# Patient Record
Sex: Male | Born: 1955 | Race: White | Hispanic: No | Marital: Married | Smoking: Current every day smoker
Health system: Southern US, Community
[De-identification: ages and names within clinical notes are randomized; demographics above are authoritative.]

## PROBLEM LIST (undated history)

## (undated) DIAGNOSIS — E1165 Type 2 diabetes mellitus with hyperglycemia: Secondary | ICD-10-CM

## (undated) DIAGNOSIS — Z9889 Other specified postprocedural states: Secondary | ICD-10-CM

## (undated) DIAGNOSIS — I10 Essential (primary) hypertension: Secondary | ICD-10-CM

## (undated) DIAGNOSIS — F329 Major depressive disorder, single episode, unspecified: Secondary | ICD-10-CM

## (undated) DIAGNOSIS — F419 Anxiety disorder, unspecified: Secondary | ICD-10-CM

## (undated) DIAGNOSIS — I219 Acute myocardial infarction, unspecified: Secondary | ICD-10-CM

## (undated) DIAGNOSIS — C61 Malignant neoplasm of prostate: Secondary | ICD-10-CM

## (undated) DIAGNOSIS — I8390 Asymptomatic varicose veins of unspecified lower extremity: Secondary | ICD-10-CM

## (undated) DIAGNOSIS — Z8489 Family history of other specified conditions: Secondary | ICD-10-CM

## (undated) DIAGNOSIS — C801 Malignant (primary) neoplasm, unspecified: Secondary | ICD-10-CM

## (undated) DIAGNOSIS — F32A Depression, unspecified: Secondary | ICD-10-CM

## (undated) DIAGNOSIS — R112 Nausea with vomiting, unspecified: Secondary | ICD-10-CM

## (undated) DIAGNOSIS — N189 Chronic kidney disease, unspecified: Secondary | ICD-10-CM

## (undated) DIAGNOSIS — E669 Obesity, unspecified: Secondary | ICD-10-CM

## (undated) DIAGNOSIS — IMO0001 Reserved for inherently not codable concepts without codable children: Secondary | ICD-10-CM

## (undated) DIAGNOSIS — E785 Hyperlipidemia, unspecified: Secondary | ICD-10-CM

## (undated) HISTORY — DX: Type 2 diabetes mellitus with hyperglycemia: E11.65

## (undated) HISTORY — DX: Reserved for inherently not codable concepts without codable children: IMO0001

## (undated) HISTORY — PX: CARDIAC CATHETERIZATION: SHX172

## (undated) HISTORY — DX: Malignant neoplasm of prostate: C61

## (undated) HISTORY — PX: TONSILLECTOMY: SUR1361

## (undated) HISTORY — DX: Hyperlipidemia, unspecified: E78.5

## (undated) HISTORY — PX: INTRAOPERATIVE ARTERIOGRAM: SHX5157

## (undated) HISTORY — DX: Essential (primary) hypertension: I10

---

## 2006-11-04 ENCOUNTER — Ambulatory Visit: Payer: Self-pay | Admitting: Gastroenterology

## 2007-12-15 ENCOUNTER — Ambulatory Visit: Payer: Self-pay | Admitting: Internal Medicine

## 2010-06-26 ENCOUNTER — Ambulatory Visit: Payer: Self-pay | Admitting: Gastroenterology

## 2010-06-29 LAB — PATHOLOGY REPORT

## 2010-07-30 ENCOUNTER — Ambulatory Visit: Payer: Self-pay | Admitting: Internal Medicine

## 2010-11-24 ENCOUNTER — Ambulatory Visit: Payer: Self-pay | Admitting: Physician Assistant

## 2011-05-18 HISTORY — PX: COLONOSCOPY: SHX174

## 2012-04-07 ENCOUNTER — Emergency Department: Payer: Self-pay | Admitting: Emergency Medicine

## 2012-05-01 ENCOUNTER — Ambulatory Visit: Payer: Self-pay | Admitting: Orthopedic Surgery

## 2013-02-02 ENCOUNTER — Ambulatory Visit: Payer: Self-pay | Admitting: Urology

## 2013-02-09 ENCOUNTER — Telehealth: Payer: Self-pay | Admitting: *Deleted

## 2013-02-09 NOTE — Telephone Encounter (Signed)
LVM that I called with information regarding Prostate Clinic.  Will call, again, next Monday.  Young Berry, RN, BSN, [facility] Prostate Oncology Navigator [phone]

## 2013-02-12 ENCOUNTER — Telehealth: Payer: Self-pay | Admitting: *Deleted

## 2013-02-12 NOTE — Telephone Encounter (Signed)
Reached pt at home.  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  Pt spoke at length re: "injection that I got in my abdomen on Friday that didn't hurt at first but by Saturday morning was bad enough that I called the doctor".  Pt indicated he did not go to work this morning d/t lingering discomfort.   I encouraged pt to call me with any questions once he receives the Information Packet and indicated that I will call him again a day or so before the clinic.  Pt indicated he appreciated information about the clinic and my call.  Pt expressed "I feel a whole lot better now that I talked with you".   Young Berry, RN, BSN, [facility] Prostate Oncology Navigator [phone]

## 2013-02-21 ENCOUNTER — Telehealth: Payer: Self-pay | Admitting: *Deleted

## 2013-02-21 NOTE — Telephone Encounter (Signed)
Called pt to remind him of his 7:45 Prostate MDC arrival time; spoke with his wife who said she would relay message.  Young Berry, RN, BSN, [facility] Prostate Oncology Navigator [phone]

## 2013-02-22 ENCOUNTER — Encounter: Payer: Self-pay | Admitting: Radiation Oncology

## 2013-02-22 NOTE — Progress Notes (Signed)
GU Location of Tumor / Histology: adenocarcinoma of the prostate  If Prostate Cancer, Gleason Score is (5 + 4=9) and PSA is (15.3)  Patient presented June 2014 with elevated PSA  Biopsies of prostate (if applicable) revealed:     Past/Anticipated interventions by urology, if any: possible surgery followed by xrt  Past/Anticipated interventions by medical oncology, if any: NONE  Weight changes, if any: None noted  Bowel/Bladder complaints, if any: Not present-change in urinary stream, difficulty with erection, discharge, impotence, incontinence, kidney stones, nocturia, or painful urination   Nausea/Vomiting, if any: None noted  Pain issues, if any:  None noted  SAFETY ISSUES:  Prior radiation? NO  Pacemaker/ICD? NO  Possible current pregnancy? NO  Is the patient on methotrexate? NO  Current Complaints / other details:  57 year old male T4N3 positive for nodal involvement.

## 2013-02-23 ENCOUNTER — Encounter: Payer: Self-pay | Admitting: Radiation Oncology

## 2013-02-23 ENCOUNTER — Ambulatory Visit
Admission: RE | Admit: 2013-02-23 | Discharge: 2013-02-23 | Disposition: A | Discharge status: Home / Self Care | Payer: 59 | Source: Ambulatory Visit | Attending: Radiation Oncology | Admitting: Radiation Oncology

## 2013-02-23 ENCOUNTER — Encounter: Payer: Self-pay | Admitting: *Deleted

## 2013-02-23 ENCOUNTER — Ambulatory Visit (HOSPITAL_BASED_OUTPATIENT_CLINIC_OR_DEPARTMENT_OTHER): Payer: 59 | Admitting: Oncology

## 2013-02-23 ENCOUNTER — Encounter (INDEPENDENT_AMBULATORY_CARE_PROVIDER_SITE_OTHER): Payer: Self-pay

## 2013-02-23 VITALS — BP 133/72 | HR 55 | Temp 98.8°F | Resp 18 | Ht 67.0 in | Wt 211.7 lb

## 2013-02-23 DIAGNOSIS — R599 Enlarged lymph nodes, unspecified: Secondary | ICD-10-CM | POA: Insufficient documentation

## 2013-02-23 DIAGNOSIS — C61 Malignant neoplasm of prostate: Secondary | ICD-10-CM | POA: Insufficient documentation

## 2013-02-23 DIAGNOSIS — E119 Type 2 diabetes mellitus without complications: Secondary | ICD-10-CM | POA: Insufficient documentation

## 2013-02-23 DIAGNOSIS — I10 Essential (primary) hypertension: Secondary | ICD-10-CM

## 2013-02-23 DIAGNOSIS — I252 Old myocardial infarction: Secondary | ICD-10-CM | POA: Insufficient documentation

## 2013-02-23 HISTORY — DX: Acute myocardial infarction, unspecified: I21.9

## 2013-02-23 HISTORY — DX: Malignant (primary) neoplasm, unspecified: C80.1

## 2013-02-23 HISTORY — DX: Anxiety disorder, unspecified: F41.9

## 2013-02-23 HISTORY — DX: Major depressive disorder, single episode, unspecified: F32.9

## 2013-02-23 HISTORY — DX: Depression, unspecified: F32.A

## 2013-02-23 NOTE — Progress Notes (Signed)
Met with patient as part of Prostate MDC.  Reintroduced my role as his navigator and encouraged him to call as he proceeds with treatments and appointments at [facility].  Provided the accompanying Care Plan Summary.     Prostate MDC Care Plan Summary  Name: Sean Petersen DOB: Oct 08, 2055  Your Medical Team:   Urologist - Dr. Sebastian Ache, [facility]  Radiation Oncologist - Dr. Margaretmary Dys  Medical Oncologist - Dr. Eli Hose  Recommendations: 1) MRI of lumbar spine to rule out metastatic disease 2) If MRI is negative, proceed with hormone therapy and radiation therapy * These recommendations are based on information available as of today's consult.      Recommendations may change depending on the results of further tests or exams.  Next Steps: 1) MRI at [facility] 2) Follow-up visit with Dr. Berneice Heinrich at [facility]  When appointments need to be scheduled, you will be contacted by [facility] and/or [facility].   Questions? Please do not hesitate to call Young Berry, RN, BSN, [facility] at [phone] with any questions or concerns.  [name] is Radio broadcast assistant and is available to assist you while you're receiving your medical care at [facility].

## 2013-02-23 NOTE — Progress Notes (Signed)
[facility] Clinical Social Work Prostate Clinic Clinical Social Work met with Pt and his wife at prostate clinic for assessment of psychosocial needs and to review Pt's distress screen.  Pt reports a 5 or moderate distress on the distress screen. Pt shared he has had some issues with depression due to pain in his thumb from an accident several years ago. He does have some anti-anxiety medication that seems to help him. CSW encouraged Pt to share his pain concerns with the medical team. CSW reviewed concerns and shared with them both resources/support available to assist. Pt will probably seek care in [place] and CSW discussed resources there to assist. They both deny other concerns or stressors and agree to seek assistance as needed.   Doreen Salvage, LCSW Clinical Social Worker [facility] Center for Patient & Family Support [facility] Wednesday, Thursday and Friday Phone: [phone] Fax: [phone]

## 2013-02-23 NOTE — Consult Note (Signed)
Reason for Referral: Prostate cancer.   HPI: 57 year old gentleman currently of [place] her he lived the majority of his life. He is a gentleman with a history of hypertension and diabetes and recently diagnosed prostate cancer. He was found to have an elevated PSA of 15.3 and subsequently underwent a prostate biopsy and should he had a Gleason he has very little score of 5+4 equals 9. He was evaluated by Dr. Edwyna Shell and subsequently referred to the prostate cancer multidisciplinary clinic for a second opinion. His staging workup revealed findings to suggest metastatic disease these include CT scan of the abdomen and pelvis on 02/02/2013 which showed a prominent and enlarged retroperitoneal lymph nodes with degenerative changes seen in the spine. A bone scan done on 02/02/2013 showed areas of increased activity in the lumbar spine and lumbosacral junction this might represent degenerative changes versus metastatic disease.  Clinically, esterase a very functional gentleman and continue to work full time. He has very little urinary symptoms including no urgency or straining does report very infrequent weak stream and frequency. Very little nocturia or hematuria. He is not reporting any constitutional symptoms of weight loss or appetite changes. Does not report any back pain or any bone pain. [place] any chest pain or difficulty breathing. [place] any cough or hemoptysis or hematemesis. Does not report any abdominal pain or constipation or diarrhea. [place] any hematochezia or melena. [place] any neurological symptoms.   Past Medical History  Diagnosis Date  . Type II or unspecified type diabetes mellitus without mention of complication, uncontrolled   . Other and unspecified hyperlipidemia   . Essential hypertension, benign   . Prostate cancer   . Myocardial infarction   . Cancer     states he had cancer in his left forearm but is unaware of type and was told his body took  care of it  . Anxiety   . Depression   :  Past Surgical History  Procedure Laterality Date  . Colonoscopy  2013  :   Current Outpatient Prescriptions  Medication Sig Dispense Refill  . ALPRAZolam (XANAX) 0.5 MG tablet Take 0.5 mg by mouth at bedtime as needed for sleep.      Marland Kitchen amLODipine (NORVASC) 10 MG tablet Take 10 mg by mouth daily.      Marland Kitchen aspirin 81 MG tablet Take 81 mg by mouth daily.      Marland Kitchen atenolol (TENORMIN) 100 MG tablet Take 100 mg by mouth daily.      Marland Kitchen doxycycline (VIBRA-TABS) 100 MG tablet       . ibuprofen (ADVIL,MOTRIN) 200 MG tablet Take 200 mg by mouth every 6 (six) hours as needed for pain.      . isosorbide mononitrate (IMDUR) 30 MG 24 hr tablet Take 30 mg by mouth daily.      Marland Kitchen lisinopril (PRINIVIL,ZESTRIL) 20 MG tablet       . metFORMIN (GLUCOPHAGE) 500 MG tablet Take 500 mg by mouth 2 (two) times daily with a meal.      . Multiple Vitamins-Minerals (MEGA MULTI MEN PO) Take by mouth.      . nitroGLYCERIN (NITRODUR - DOSED IN MG/24 HR) 0.4 mg/hr patch Place 1 patch onto the skin daily.      . simvastatin (ZOCOR) 40 MG tablet Take 40 mg by mouth every evening.      . Vitamin D, Ergocalciferol, (DRISDOL) 50000 UNITS CAPS capsule        No current facility-administered medications for this visit.  Allergies  Allergen Reactions  . Codeine Sulfate Nausea And Vomiting  :  Family History  Problem Relation Age of Onset  . Adopted: Yes  :  History   Social History  . Marital Status: Married    Spouse Name: N/A    Number of Children: N/A  . Years of Education: N/A   Occupational History  . Not on file.   Social History Main Topics  . Smoking status: Current Every Day Smoker -- 1.00 packs/day    Types: Cigarettes  . Smokeless tobacco: Never Used  . Alcohol Use: Yes     Comment: 28 12 ounce beers per week  . Drug Use: No  . Sexual Activity: Not on file   Other Topics Concern  . Not on file   Social History Narrative  . No narrative on  file  :  Pertinent items are noted in HPI.  Exam: ECOG 0 There were no vitals taken for this visit. General appearance: alert, cooperative and appears stated age Head: Normocephalic, without obvious abnormality, atraumatic Throat: lips, mucosa, and tongue normal; teeth and gums normal Neck: no adenopathy, no carotid bruit, no JVD, supple, symmetrical, trachea midline and thyroid not enlarged, symmetric, no tenderness/mass/nodules Back: negative Chest wall: no tenderness Cardio: regular rate and rhythm, S1, S2 normal, no murmur, click, rub or gallop GI: soft, non-tender; bowel sounds normal; no masses,  no organomegaly Extremities: extremities normal, atraumatic, no cyanosis or edema Pulses: 2+ and symmetric Skin: Skin color, texture, turgor normal. No rashes or lesions   Radiology: His bone scan and CT scan were reviewed today with radiology.  Pathology: His pathological slides were reviewed with the reviewing pathologists today.   Assessment and Plan:   57 year old gentleman with a diagnosis of prostate cancer after presenting with a PSA 15.3 and underwent a a biopsy that confirmed the presence of a Gleason score 5+4 equals 9 involving 8 cores. His staging workup suggesting possible nodal metastasis as well as bony metastasis but not confirmed at this time. His biopsy did confirm an extra prostatic extension at the apex and puts him at a high risk of advanced disease. We have discussed these findings today at the prostate cancer multidisciplinary clinic and I personally reviewed this information with the patient today. History of an options really hinges on whether he has advanced disease or not. The general consensus is to obtain an MRI of the spine and hopefully that will shed some light whether he has advanced bone disease or lymph node involvement. If he has indeed advanced disease and he'll need systemic therapy with androgen deprivation plus or minus systemic chemotherapy which I  have discussed with him today. But if his MRI did not show any disease outside the prostate that he might be a candidate for local therapy with radiation therapy and androgen deprivation. All his questions were answered today and we will await the results of the.

## 2013-02-23 NOTE — Progress Notes (Signed)
Patient reports that he was adopted. He works as a Energy manager. Married with no children. Reports throbbing pain in he left thumb. Reports rectal bleeding. Denies dribbling urine, incontinence, hematuria, or dysuria. IPSS 6 noted.

## 2013-02-23 NOTE — Progress Notes (Signed)
Radiation Oncology         [phone]) [phone] ________________________________  Initial outpatient Consultation  Name: Sean Petersen MRN: [id number]  Date: 02/23/2013  DOB: 1955/12/31  WJ:XBJYN,[id number], MD  Crecencio Mc, MD   REFERRING PHYSICIAN: Crecencio Mc, MD  DIAGNOSIS: 57 yo man with stage T1c adenocarcinoma of the prostate with a Gleason's score of 5+4 and a PSA of 15.3  HISTORY OF PRESENT ILLNESS::Sean Petersen is a 57 y.o. gentleman.  He was noted to have an elevated PSA of 15.3 by his primary care physician, Dr. Carlota Raspberry.  Accordingly, he was referred for evaluation in urology by Dr. Edwyna Shell on 12/01/12,  digital rectal examination was performed at that time revealing no nodules.  The patient proceeded to transrectal ultrasound with 12 biopsies of the prostate on 01/19/13.  The prostate volume measured 20 gm.  Out of 12 core biopsies, 8 were positive.  The maximum Gleason score was 5+4.  The patient reviewed the biopsy results with his urologist and he has kindly been referred today for discussion of potential radiation treatment options.  PREVIOUS RADIATION THERAPY: No  PAST MEDICAL HISTORY:  has a past medical history of Type II or unspecified type diabetes mellitus without mention of complication, uncontrolled; Other and unspecified hyperlipidemia; Essential hypertension, benign; Prostate cancer; Myocardial infarction; Cancer; Anxiety; and Depression.    PAST SURGICAL HISTORY: Past Surgical History  Procedure Laterality Date  . Colonoscopy  2013    FAMILY HISTORY: family history is not on file. He was adopted.  SOCIAL HISTORY:  reports that he has been smoking Cigarettes.  He has been smoking about 1.00 pack per day. He has never used smokeless tobacco. He reports that he drinks alcohol. He reports that he does not use illicit drugs.  ALLERGIES: Codeine sulfate  MEDICATIONS:  Current Outpatient Prescriptions  Medication Sig Dispense Refill  . ALPRAZolam (XANAX) 0.5 MG tablet Take  0.5 mg by mouth at bedtime as needed for sleep.      Marland Kitchen amLODipine (NORVASC) 10 MG tablet Take 10 mg by mouth daily.      Marland Kitchen aspirin 81 MG tablet Take 81 mg by mouth daily.      Marland Kitchen atenolol (TENORMIN) 100 MG tablet Take 100 mg by mouth daily.      . isosorbide mononitrate (IMDUR) 30 MG 24 hr tablet Take 30 mg by mouth daily.      Marland Kitchen lisinopril (PRINIVIL,ZESTRIL) 20 MG tablet       . metFORMIN (GLUCOPHAGE) 500 MG tablet Take 500 mg by mouth 2 (two) times daily with a meal.      . Multiple Vitamins-Minerals (MEGA MULTI MEN PO) Take by mouth.      . nitroGLYCERIN (NITRODUR - DOSED IN MG/24 HR) 0.4 mg/hr patch Place 1 patch onto the skin daily.      . simvastatin (ZOCOR) 40 MG tablet Take 40 mg by mouth every evening.      . Vitamin D, Ergocalciferol, (DRISDOL) 50000 UNITS CAPS capsule       . doxycycline (VIBRA-TABS) 100 MG tablet       . ibuprofen (ADVIL,MOTRIN) 200 MG tablet Take 200 mg by mouth every 6 (six) hours as needed for pain.       No current facility-administered medications for this encounter.    REVIEW OF SYSTEMS:  A 15 point review of systems is documented in the electronic medical record. This was obtained by the nursing staff. However, I reviewed this with the patient to discuss relevant findings and make appropriate  changes.  A comprehensive review of systems was negative..  The patient completed an IPSS and IIEF questionnaire.  His IPSS score was 6 indicating mild urinary outflow obstructive symptoms.  He indicated that his erectile function is unable to complete sexual activity.   PHYSICAL EXAM: This patient is in no acute distress.  He is alert and oriented.   height is 5\' 7"  (1.702 m) and weight is 211 lb 11.2 oz (96.026 kg). His oral temperature is 98.8 F (37.1 C). His blood pressure is 133/72 and his pulse is 55. His respiration is 18 and oxygen saturation is 100%.  He exhibits no respiratory distress or labored breathing.  He appears neurologically intact.  His mood is  pleasant.  His affect is appropriate.  Please note the digital rectal exam findings described above.  KPS = 100-  Normal; no complaints; no evidence of disease.   RADIOGRAPHY:  02/02/13 - bone scan notable for lumbar activity possibly representing degenerative disease 02/02/13 - CT Abd/Pelv notable for enlarged retroperitoneal lymph nodes    IMPRESSION: This gentleman is a 57 yo man with stage T1c adenocarcinoma of the prostate with a Gleason's score of 5+4 and a PSA of 15.3.  His T-Stage, Gleason's Score, and PSA put him into the high risk group and he may even have metastatic nodal involvement.  I would suggest lumbar MRI to rule out metastatic disease based on bone scan.  If MRI negative, he is eligible for a variety of potential treatment options including external radiation with hormone therapy.  PLAN:Today I reviewed the findings and workup thus far.  We discussed the natural history of prostate cancer.  We reviewed the the implications of T-stage, Gleason's Score, and PSA on decision-making and outcomes in prostate cancer.  We discussed MRI and possible radiotherapy if MRI shows no metastases.  We discussed radiation treatment in the management of prostate cancer with regard to the logistics and delivery of external beam radiation treatment as well as the logistics and delivery of prostate brachytherapy.  We compared and contrasted each of these approaches and also compared these against prostatectomy.  The patient expressed interest in external beam radiotherapy.  I filled out a patient counseling form for him with relevant treatment diagrams and we retained a copy for our records.   The patient would like to proceed MRI, and if negative with prostate IMRT in conjunction with androgen deprivation.  I will share my findings with Dr. Edwyna Shell and move forward with scheduling placement of three gold fiducial markers into the prostate to proceed with IMRT in December if MRI negative.  He would like to  receive radiotherapy at [facility].  I enjoyed meeting with him today, and will look forward to participating in the care of this very nice gentleman.   I spent 60 minutes face to face with the patient and more than 50% of that time was spent in counseling and/or coordination of care.   ------------------------------------------------  Artist Pais. Kathrynn Running, M.D.

## 2013-02-23 NOTE — Progress Notes (Signed)
Please see consult note.  

## 2013-02-27 ENCOUNTER — Encounter: Payer: Self-pay | Admitting: Radiation Oncology

## 2013-02-27 NOTE — Progress Notes (Signed)
MRI lumbar spine w & wo contrast approval from [facility] [phone]

## 2013-02-28 ENCOUNTER — Ambulatory Visit: Payer: Self-pay | Admitting: Oncology

## 2013-03-16 ENCOUNTER — Ambulatory Visit: Payer: Self-pay | Admitting: Radiation Oncology

## 2013-03-21 ENCOUNTER — Encounter: Payer: Self-pay | Admitting: Radiation Oncology

## 2013-03-21 ENCOUNTER — Telehealth: Payer: Self-pay | Admitting: *Deleted

## 2013-03-21 DIAGNOSIS — C61 Malignant neoplasm of prostate: Secondary | ICD-10-CM

## 2013-03-21 NOTE — Telephone Encounter (Signed)
Called patient to inform of appts., lvm for a return call 

## 2013-03-21 NOTE — Telephone Encounter (Signed)
Called patient to inform of appt. For consultation with Dr. Carmina Miller on Monday Nov. 10- arrival time - 1 pm, and another appt. For consultation with Dr. Edwyna Shell on December 16- arrival time - 3:30 pm, spoke with patient's wife - Pattricia Boss and she is aware of both of these appts.

## 2013-03-21 NOTE — Progress Notes (Signed)
  Radiation Oncology         [phone]) [phone] ________________________________  Name: Sean Petersen  MRN: [id number]  Date: 03/21/2013  DOB: May 11, 1956  Chart Note:  I reviewed this patient's most recent findings and wanted to take a minute to document my impression.  Radiographic Findings: His MRI at [facility] shows no metastases.  Impression:  In light of this information, the patient is eligible for localized prostate radiotherapy.  Plan:  At this point, the patient has started neoadjuvant androgen deprivation, and will need to be set up to proceed with consultation with Dr. Rushie Chestnut in rad-onc at [facility].  ________________________________  Artist Pais. Kathrynn Running, M.D.

## 2013-03-22 ENCOUNTER — Telehealth: Payer: Self-pay | Admitting: *Deleted

## 2013-03-22 NOTE — Telephone Encounter (Signed)
This patient has an appt. With Dr. Carmina Miller on Monday Nov. 10 at 1:00 p.m.

## 2013-03-26 ENCOUNTER — Ambulatory Visit: Payer: Self-pay | Admitting: Radiation Oncology

## 2013-04-03 ENCOUNTER — Telehealth: Payer: Self-pay | Admitting: *Deleted

## 2013-04-03 NOTE — Telephone Encounter (Signed)
I referred the patient to Dr. Rushie Chestnut for radiotherapy.  I am not aware of a registered letter.

## 2013-04-03 NOTE — Telephone Encounter (Signed)
Returned patient's wife phone call, spoke with patient's wife 

## 2013-04-03 NOTE — Telephone Encounter (Signed)
Sean Petersen wife called and stated they had received a registered letter from Dr. Kathrynn Running regarding management of his prostate cancer.  His spouse stated he has been seen by Dr. Carmina Miller and has another appointment scheduled this week. Requested she inform us of his decision in regards to his care and she agreed.

## 2013-04-16 ENCOUNTER — Ambulatory Visit: Payer: Self-pay | Admitting: Radiation Oncology

## 2013-05-01 NOTE — Telephone Encounter (Signed)
Mr. Sean Petersen mother called at ~63pm on yesterday and she reported that Sean Petersen was coughing frequently with expectoration of Phelgm.  After consulting with Dr. Dayton Scrape, he was instructed to take Mucinex and push fluids.  The bleeding that was noted when he coughs was from an accidental scratch in the non-healed area of [name]'s prior treatment filed on his left face.  Instructed mother to cleanse with Saline as the norm and to reapply neosporin to this area, then redress.

## 2013-05-07 ENCOUNTER — Encounter: Payer: Self-pay | Admitting: *Deleted

## 2013-05-07 NOTE — Progress Notes (Addendum)
Erroneous entry on 05/01/13 - Incorrect Patient

## 2013-05-14 LAB — CBC CANCER CENTER
Basophil #: 0 x10 3/mm (ref 0.0–0.1)
Eosinophil %: 3.3 %
HCT: 43 % (ref 40.0–52.0)
HGB: 14.3 g/dL (ref 13.0–18.0)
MCH: 32.4 pg (ref 26.0–34.0)
MCV: 97 fL (ref 80–100)
Monocyte #: 0.7 x10 3/mm (ref 0.2–1.0)
Monocyte %: 10.5 %
Neutrophil %: 64.8 %
RBC: 4.43 10*6/uL (ref 4.40–5.90)

## 2013-05-17 ENCOUNTER — Ambulatory Visit: Payer: Self-pay | Admitting: Radiation Oncology

## 2013-06-17 ENCOUNTER — Ambulatory Visit: Payer: Self-pay | Admitting: Radiation Oncology

## 2013-07-15 ENCOUNTER — Ambulatory Visit: Payer: Self-pay | Admitting: Radiation Oncology

## 2013-11-22 ENCOUNTER — Ambulatory Visit: Payer: Self-pay | Admitting: Radiation Oncology

## 2013-12-15 ENCOUNTER — Ambulatory Visit: Payer: Self-pay | Admitting: Radiation Oncology

## 2014-05-14 DIAGNOSIS — I2510 Atherosclerotic heart disease of native coronary artery without angina pectoris: Secondary | ICD-10-CM | POA: Insufficient documentation

## 2014-05-23 ENCOUNTER — Ambulatory Visit: Payer: Self-pay | Admitting: Radiation Oncology

## 2014-06-17 ENCOUNTER — Ambulatory Visit: Payer: Self-pay | Admitting: Internal Medicine

## 2014-06-17 ENCOUNTER — Ambulatory Visit: Payer: Self-pay | Admitting: Radiation Oncology

## 2014-06-20 ENCOUNTER — Encounter: Payer: Self-pay | Admitting: Internal Medicine

## 2014-07-02 ENCOUNTER — Emergency Department: Payer: Self-pay | Admitting: Emergency Medicine

## 2014-07-16 ENCOUNTER — Ambulatory Visit
Admit: 2014-07-16 | Disposition: A | Discharge status: Home / Self Care | Payer: Self-pay | Attending: Radiation Oncology | Admitting: Radiation Oncology

## 2014-09-06 NOTE — Consult Note (Signed)
Reason for Visit: This 59 year old Male patient presents to the clinic for initial evaluation of  prostate cancer .   Referred by Dr. Elnoria Howard.  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 Patient has stage IIc (T2 C. N0 M0)  Pathology Report pathology report reviewed   Imaging Report MRI scans and bone scans reviewed   Referral Report clinical notes reviewed   Planned Treatment Regimen IMRT radiation with image guided radiation therapy   HPI   patient is a 59 year old male who presented to his PMD with a PSA of 15.3. Underwent transrectal ultrasound-guided biopsy positive forbi lobar adenocarcinoma the prostate mostly Gleason 9 (5+4) mostly contained in the left side. Patient had bone scan showing some increased uptake in his lumbar spine on review of MRI scans not thought to be related related to metastatic disease. Patient has nocturia x3 some slight urgency and frequency. He is seen today for consideration of radiation therapy. He has been started on a monthly hormone suppression therapy and I have obtained details of that injection. Patient did have significant pain throughout his body after receiving his injections.  Past, Family and Social History:  Past Medical History positive   Cardiovascular hyperlipidemia; hypertension; myocardial infarction   Endocrine diabetes mellitus   Neurological/Psychiatric anxiety; depression   Family History positive   Family History Comments patient was adopted no known family history   Social History positive   Social History Comments 30-pack-year smoking history continues to smoke social EtOH his history   Additional Past Medical and Surgical History seen by himself today   Allergies:    Lipitor: Headaches  Codeine: Other  Home Meds:  Home Medications: Medication Instructions Status  Lipitor 20 mg oral tablet 1 tab(s) orally once a day  Active  Imdur 30 mg oral tablet, extended release 1 tab(s) orally once a day  Active  atenolol 100 mg oral tablet 1 tab(s) orally once a day  Active  Ascriptin Enteric 81 mg oral tablet 1 tab(s)  once a day (at bedtime) Active  nitroglycerin 0.4 milligram(s) sublingual , As Needed- for Chest Pain  Active  Xanax 0.5 mg oral tablet 1 tab(s) orally once a day, As Needed- for Anxiety, Nervousness  Active  metformin 500 mg oral tablet 1 tab(s) orally once a day (at bedtime) Active  lisinopril 20 mg oral tablet 1 tab(s) orally once a day (at bedtime) Active  multivitamin 1 tab(s) orally once a day (at bedtime) Active  Norvasc 10 mg oral tablet 1 tab(s) orally once a day Active   Review of Systems:  General negative   Performance Status (ECOG) 0   Skin negative   Breast negative   Ophthalmologic negative   ENMT negative   Respiratory and Thorax negative   Cardiovascular negative   Gastrointestinal negative   Genitourinary see HPI   Musculoskeletal negative   Neurological negative   Psychiatric negative   Hematology/Lymphatics negative   Endocrine negative   Allergic/Immunologic negative   Review of Systems   review of systems obtained from nurses notes  Nursing Notes:  Nursing Vital Signs and Chemo Nursing Nursing Notes: *CC Vital Signs Flowsheet:   10-Nov-14 13:22  Temp Temperature 98  Pulse Pulse 53  Respirations Respirations 20  SBP SBP 123  DBP DBP 71  Pain Scale (0-10)  3  Current Weight (kg) (kg) 100.8  Height (cm) centimeters 170  BSA (m2) 2.1   Physical Exam:  General/Skin/HEENT:  General normal   Skin normal   Eyes normal   ENMT normal   Head and Neck normal   Additional PE well-developed male in NAD. Lungs are clear to A&P cardiac examination shows regular rate and rhythm. On  rectal exam rectal sphincter tone is good. There some slight obliteration of the right lateral sulcus. Right sulcus is fine no discreet nodularity is identified. No other rectal abnormalities are noted.   Breasts/Resp/CV/GI/GU:  Respiratory and Thorax normal   Cardiovascular normal   Gastrointestinal normal   Genitourinary normal   MS/Neuro/Psych/Lymph:  Musculoskeletal normal   Neurological normal   Lymphatics normal   Other Results:  Radiology Results: LabUnknown:    19-Sep-14 12:32, CT Abdomen and Pelvis With Contrast  PACS Image     19-Sep-14 14:40, Bone Scan Whole Body (Part 2 of 2)  PACS Image     31-Oct-14 11:06, MRI Lumbar Spine WWO  PACS Image   MRI:    10-Jul-12 15:17, MRI Lumbar Spine Without Contrast  MRI Lumbar Spine Without Contrast   REASON FOR EXAM:    low back pain  COMMENTS:       PROCEDURE: MR  - MR LUMBAR SPINE WO CONTRAST  - Nov 24 2010  3:17PM     RESULT: MRI LUMBAR SPINE WITHOUT CONTRAST    HISTORY: Low back pain    COMPARISON: None    TECHNIQUE: Multiplanar and multisequence MRI of the lumbar spine were   obtained, without administration of IV contrast.     FINDINGS:   The vertebral bodies of the lumbar spine are normal in size and   alignment. There is normal bone marrow signal demonstrated throughout the   vertebra. The intervertebral disc spaces are well-maintained.     The spinal cord is of normal volume and contour. The cord terminates   normally at L1 . The nerve roots of the cauda equina and the filum   terminale have the usual appearance.     The visualized portions of the SI joints are unremarkable.    The imaged intra-abdominal contents are unremarkable.     T11-T12: There is a mild broad-based disc bulge.    T12-L1: No significant disc bulge. No evidence of neural foraminal or     central stenosis.    L1-L2: Minimal broad-based disc bulge. No evidence of neural foraminal or   central stenosis.     L2-L3: No  significant disc bulge. No evidence of neural foraminal or   central stenosis.     L3-L4: No significant disc bulge. No evidence of neuralforaminal or   central stenosis. Mild bilateral facet arthropathy.    L4-L5: No significant disc bulge. No evidence of neural foraminal or   central stenosis. Mild bilateral facet arthropathy.    L5-S1: Small right paracentral disc protrusion abutting the right     intrathecal S1 nerve root. No evidence of neural foraminal or central   stenosis.    IMPRESSION:     1. Lumbar spine spondylosis as described above.          Verified By: Jennette Banker, M.D., MD    31-Oct-14 11:06, MRI Lumbar Spine WWO  MRI Lumbar Spine WWO   REASON FOR EXAM:    prostate CA  eval mets  COMMENTS:       PROCEDURE: MMR -  MMR LUMBAR SPINE WO/W  - Mar 16 2013 11:06AM     RESULT: History call prostate cancer.    Comparison Study: MRI lumbar spine.    Findings: Multiplanar, multisequence imaginglumbar spine is obtained.   Multilevel disc degeneration with multilevel annular bulge. Multilevel   disc degeneration with mild annular bulge. Small L4-L5 central disc   protrusion with minimal deformity thecal sac. L5-S1 small right   paracentral disc protrusion is present with mild deformity thecal sac.   Facet hypertrophy is present. Mild narrowing right neural foramen .No     evidence of  spinal stenosis. No focal bony abnormality significant   abnormality identified. Hemangioma noted L5. Noevidence of metastatic   disease or enhancing abnormality. There is no disc enhancement.    IMPRESSION:  Multilevel disc degeneration. Small L4-L5 central disc   protrusion with minimal deformity thecal sac. L5-S1 small right   paracentral disc protrusion and facet hypertrophy resulting in mild   narrowing of the right neural foramen at this level.        Verified By: Osa Craver, M.D., MD  CT:    19-Sep-14 12:32, CT Abdomen and Pelvis With Contrast  CT Abdomen and  Pelvis With Contrast   REASON FOR EXAM:    Prostate Ca Metformin  COMMENTS:       PROCEDURE: KCT - KCT ABDOMEN/PELVIS W  - Feb 02 2013 12:32PM     RESULT: CT of the abdomen and pelvis is performed utilizing 100 mL of   Isovue-370 iodinated intravenous contrast along with oral contrast.   Images are reconstructed at 3 mm slice thickness in the axial plane.   Delayed postcontrast images are performed.    The lung bases show grossly normal aeration. There is no effusion or   pneumothorax. The heart does not appear to be enlarged. The abdominal   aorta is normal in caliber with atherosclerotic calcification   demonstrated. There is a tiny low-attenuation areas especially in the   lower pole the right kidney an upper pole the left kidney. These appear     to represent small bilateral renal cysts on the postcontrast delayed   images which shows excretion of contrast passed 5 urine by both kidneys.   There is no urinary obstruction evident. The liver and spleen enhance   normally. No gallstones are evident. The pancreas is within normal   limits. The adrenal glands are normal. The stomach, small bowel and colon   appear unremarkable. The urinary bladder is within normal limits. The   prostate appears to be at the upper limits of normal in size. There is an   enlarged retroperitoneal soft tissue density anterior to the left psoas   lateral to the distal aorta near the iliac crest level with a short axis   of 1.2 cm on image 86. Additional smaller presumed lymph nodes are seen   in the retroperitoneum. There is no definite inguinal abnormality. There   is L5-S1 degenerative disc narrowing to a mild degree. The vertebral body   heights and intervertebral disc spaces otherwise appear to be preserved   with some marginal hypertrophic spurring. Atherosclerotic calcification   is present.  IMPRESSION:   1. The prostate is borderline enlarged. There is calcification present.   There are prominent  and enlarged retroperitoneal lymph nodes. No definite   bony metastatic lesions are seen but a bone scan is pending. Degenerative   changes are seen in the spine.  2. Multiple tiny renal cysts appear to  be present.    Dictation Site: 2        Verified By: Sundra Aland, M.D., MD  Nuclear Med:    19-Sep-14 14:40, Bone Scan Whole Body (Part 2 of 2)  Bone Scan Whole Body (Part 2 of 2)   REASON FOR EXAM:    Prostate Ca Metformin  COMMENTS:       PROCEDURE: KNM - KNM BONE WB 3HR 2 OF 2  - Feb 02 2013  2:40PM     RESULT: The patient was given a dose of 24.01 mCi of technetium 26 M MDP.   Anterior and posterior whole body images are obtained. The patient has no   previous bone scan for comparison.    There is slightly increased localization to the left of midline at the L1   level. There is symmetric increased uptake in the acromioclavicular   joints. The frontal view shows slightlyincreased uptake to the left at   what appears to be L4-L5. There is some increased uptake in the knees   left greater than right and in both feet.  IMPRESSION:  Areas of increased activity in the lumbar spine and   lumbosacral junction as well is inthe knees and feet and shoulders. The   findings could represent bony degenerative change rather than metastatic   disease.    Dictation Site: 2        Verified By: Sundra Aland, M.D., MD   Relevent Results:   Relevant Scans and Labs CT scans MRI scan and bone scan are all reviewed   Assessment and Plan: Impression:   59 year old male with stage IIb (T. to see N0 M0) adenocarcinoma of the prostate percent in the Gleason score of 9 and a PSA of 15. Plan:   at the present time I agree with Lupron suppression for about a year and a half and would treat this patient with IMRT radiation therapy up to 8000 cGy to his prostate treating his pelvic lymph nodes to another 5400 cGy using image guided technique. I've asked Dr. Elnoria Howard to place gold fiducial  markers for daily image guided radiation therapy and that has been arranged for the end of the month. I have set up CT simulation shortly after that. Risks and benefits of treatment including increased diarrhea increased urinary symptoms such as frequency urgency and nocturia, fatigue, alteration blood counts, all were explained in detail to the patient. He seems to comprehend my treatment plan well. He'll continue on Lupron therapy and keep them suppressed for about 18 months which is now recognized as probably the most beneficial time spent for hormone suppression therapy.  I would like to take this opportunity to thank you for allowing me to continue to participate in this patient's care.  CC Referral:  cc: Dr. Elnoria Howard, Dr. Zadie Cleverly   Electronic Signatures: Baruch Gouty, Roda Shutters (MD)  (Signed [phone] 15:29)  Authored: HPI, Diagnosis, PFSH, Allergies, Home Meds, ROS, Nursing Notes, Physical Exam, Other Results, Relevent Results, Encounter Assessment and Plan, CC Referring Physician   Last Updated: 10-Nov-14 15:29 by Armstead Peaks (MD)

## 2014-09-24 ENCOUNTER — Other Ambulatory Visit: Payer: Self-pay | Admitting: *Deleted

## 2014-09-24 ENCOUNTER — Other Ambulatory Visit: Payer: Self-pay | Admitting: Radiation Oncology

## 2014-09-24 DIAGNOSIS — C61 Malignant neoplasm of prostate: Secondary | ICD-10-CM

## 2014-09-24 MED ORDER — TAMSULOSIN HCL 0.4 MG PO CAPS: 0.4000 mg | ORAL_CAPSULE | Freq: Every day | ORAL | Status: DC

## 2014-10-01 ENCOUNTER — Encounter: Payer: Self-pay | Admitting: Internal Medicine

## 2014-10-04 ENCOUNTER — Inpatient Hospital Stay: Payer: 59 | Attending: Internal Medicine | Admitting: Internal Medicine

## 2014-10-04 ENCOUNTER — Inpatient Hospital Stay: Payer: 59

## 2014-10-04 VITALS — BP 168/92 | HR 60 | Temp 96.9°F | Ht 67.0 in | Wt 213.8 lb

## 2014-10-04 DIAGNOSIS — Z7982 Long term (current) use of aspirin: Secondary | ICD-10-CM

## 2014-10-04 DIAGNOSIS — F17210 Nicotine dependence, cigarettes, uncomplicated: Secondary | ICD-10-CM | POA: Insufficient documentation

## 2014-10-04 DIAGNOSIS — E785 Hyperlipidemia, unspecified: Secondary | ICD-10-CM

## 2014-10-04 DIAGNOSIS — M5136 Other intervertebral disc degeneration, lumbar region: Secondary | ICD-10-CM | POA: Diagnosis not present

## 2014-10-04 DIAGNOSIS — R109 Unspecified abdominal pain: Secondary | ICD-10-CM | POA: Diagnosis not present

## 2014-10-04 DIAGNOSIS — R609 Edema, unspecified: Secondary | ICD-10-CM | POA: Insufficient documentation

## 2014-10-04 DIAGNOSIS — Z79899 Other long term (current) drug therapy: Secondary | ICD-10-CM | POA: Insufficient documentation

## 2014-10-04 DIAGNOSIS — M549 Dorsalgia, unspecified: Secondary | ICD-10-CM | POA: Diagnosis not present

## 2014-10-04 DIAGNOSIS — F329 Major depressive disorder, single episode, unspecified: Secondary | ICD-10-CM | POA: Diagnosis not present

## 2014-10-04 DIAGNOSIS — I252 Old myocardial infarction: Secondary | ICD-10-CM | POA: Diagnosis not present

## 2014-10-04 DIAGNOSIS — E119 Type 2 diabetes mellitus without complications: Secondary | ICD-10-CM | POA: Diagnosis not present

## 2014-10-04 DIAGNOSIS — R591 Generalized enlarged lymph nodes: Secondary | ICD-10-CM | POA: Insufficient documentation

## 2014-10-04 DIAGNOSIS — F419 Anxiety disorder, unspecified: Secondary | ICD-10-CM | POA: Diagnosis not present

## 2014-10-04 DIAGNOSIS — I10 Essential (primary) hypertension: Secondary | ICD-10-CM | POA: Diagnosis not present

## 2014-10-04 DIAGNOSIS — Z923 Personal history of irradiation: Secondary | ICD-10-CM | POA: Diagnosis not present

## 2014-10-04 DIAGNOSIS — N281 Cyst of kidney, acquired: Secondary | ICD-10-CM | POA: Diagnosis not present

## 2014-10-04 DIAGNOSIS — C61 Malignant neoplasm of prostate: Secondary | ICD-10-CM | POA: Insufficient documentation

## 2014-10-04 NOTE — Progress Notes (Signed)
Patient states that he has been having extreme pain in all of his joints from his waist down  He states that he has been struggling to work because of the pain. He states that it takes all he has to get through the day.

## 2014-10-07 ENCOUNTER — Ambulatory Visit: Payer: 59

## 2014-10-08 ENCOUNTER — Encounter: Payer: Self-pay | Admitting: *Deleted

## 2014-10-08 DIAGNOSIS — C61 Malignant neoplasm of prostate: Secondary | ICD-10-CM

## 2014-10-08 NOTE — Progress Notes (Signed)
10/08/2014 Research Encounter Note: On Friday 10/04/2014 I was asked to discuss The [employer] [id number] protocol with Mr. Natter while he was in clinic.  After a brief review of the two arms of the study I gave him a copy of the consent form to read over the weekend. I explained that he would have to go through a screening period to see if he met all study criteria.  I told him I would call him next week to further discuss the protocol.  During the interim I learned that the patient has an allergy to prednisone.  This makes the patient ineligible for the protocol as one of the arms of the study requires prednisone.   Jake Samples, RN, BSN

## 2014-10-10 ENCOUNTER — Ambulatory Visit
Admission: RE | Admit: 2014-10-10 | Discharge: 2014-10-10 | Disposition: A | Discharge status: Home / Self Care | Payer: 59 | Source: Ambulatory Visit | Attending: Internal Medicine | Admitting: Internal Medicine

## 2014-10-10 ENCOUNTER — Encounter
Admission: RE | Admit: 2014-10-10 | Discharge: 2014-10-10 | Disposition: A | Discharge status: Home / Self Care | Payer: 59 | Source: Ambulatory Visit | Attending: Internal Medicine | Admitting: Internal Medicine

## 2014-10-10 DIAGNOSIS — C61 Malignant neoplasm of prostate: Secondary | ICD-10-CM | POA: Diagnosis not present

## 2014-10-10 MED ORDER — TECHNETIUM TC 99M MEDRONATE IV KIT
25.0000 | PACK | Freq: Once | INTRAVENOUS | Status: AC | PRN
  Administered 2014-10-10: 23.31 via INTRAVENOUS

## 2014-10-11 ENCOUNTER — Ambulatory Visit
Admission: RE | Admit: 2014-10-11 | Discharge: 2014-10-11 | Disposition: A | Discharge status: Home / Self Care | Payer: 59 | Source: Ambulatory Visit | Attending: Internal Medicine | Admitting: Internal Medicine

## 2014-10-11 ENCOUNTER — Encounter: Payer: Self-pay | Admitting: Internal Medicine

## 2014-10-11 ENCOUNTER — Telehealth: Payer: Self-pay | Admitting: *Deleted

## 2014-10-11 DIAGNOSIS — R590 Localized enlarged lymph nodes: Secondary | ICD-10-CM | POA: Insufficient documentation

## 2014-10-11 DIAGNOSIS — R52 Pain, unspecified: Secondary | ICD-10-CM | POA: Diagnosis present

## 2014-10-11 DIAGNOSIS — C61 Malignant neoplasm of prostate: Secondary | ICD-10-CM | POA: Diagnosis present

## 2014-10-11 DIAGNOSIS — N281 Cyst of kidney, acquired: Secondary | ICD-10-CM | POA: Insufficient documentation

## 2014-10-11 DIAGNOSIS — R972 Elevated prostate specific antigen [PSA]: Secondary | ICD-10-CM | POA: Insufficient documentation

## 2014-10-11 MED ORDER — IOHEXOL 350 MG/ML SOLN
100.0000 mL | Freq: Once | INTRAVENOUS | Status: AC | PRN
  Administered 2014-10-11: 100 mL via INTRAVENOUS

## 2014-10-11 NOTE — Telephone Encounter (Signed)
I called patient to notify him that he would not be eligible for the [facility] [phone] protocol due to a documented allergy to Prednisone.   One of the arms of the clinical trial requires daily Prednisone dosing. I had to leave a message as the patient was not home.   Jake Samples, RN

## 2014-10-13 NOTE — Progress Notes (Signed)
[facility]  [phone] [phone]     ID: Sean Petersen OB: 06/25/1955  MR#: [id number]  YSA#:[id number]  Patient Care Team: Tracie Harrier, MD as PCP - General (Internal Medicine)  CHIEF COMPLAINT/DIAGNOSIS:  TxNxMx Adenocarcinoma of the Prostate (Gleason grade 5+4 = 9) currently with rising PSA -  (Initially diagnosed by prostate biopsy on 02/01/13 with Prostate Adenocarcinoma Gleason grade 5+4 = 9 and received external beam radiation treatment and followed by Dr.Chrystal, PSA on 11/24/13 had risen to 15.3 and received Lupron injection 30 mg 1 dose. Missed follow-up appointment in November. Most recent serum PSA on 05/24/14 increased to 50.1).  06/28/14 - Bone scan. IMPRESSION: 1. Focal uptake within the right acetabular roof corresponds to degenerative change on comparison CT. 2. No evidence of skeletal metastasis. 3. Edema within the soft tissues of lower extremity. 06/28/14 - CT abdomen/pelvis. IMPRESSION: 1. Numerous pelvic and abdominal lymph nodes most of which are stable but some have definitely enlarged. This is worrisome for metastatic disease. 2. No definite sclerotic bone lesions to suggest osseous metastasis. Recommend correlation with bone scan. 3. Stable small hyperdense/hemorrhagic right renal cysts.  Restarted on lupron by Dr.Chrystal on 06/14/14.   HISTORY OF PRESENT ILLNESS:  Patient returns for continued oncology followup, he is on androgen deprivation therapy with Lupron. He had recent labs at [facility] which shows PSA is doubled at > 100, was around 50 in January. Last radiological evaluation in Feb 2016: Bone scan does not show any definite bone metastasis but CT scan did show numerous pelvic and abdominal lymph nodes which have enlarged compared to prior scan. Patient clinically states he is having intermittent abdominal/pelvic pain and also noticed persistent back pains. Denies any lower extremity muscle weakness or any bowel/bladder  retention or incontinence. No dysuria or hematuria. Denies any new cough, chest pain, or hemoptysis. No fevers. Appetite is good. Remains physically active. Denies new side effects from Lupron.  REVIEW OF SYSTEMS:   ROS As in HPI above. In addition, no fever, chills or sweats. No new headaches or focal weakness.  No new mood disturbances. No  sore throat, cough, shortness of breath, sputum, hemoptysis or chest pain. No dizziness or palpitation. No abdominal pain, constipation, diarrhea, dysuria or hematuria. No new skin rash or bleeding symptoms. No new paresthesias in extremities. PS ECOG 1.  PAST MEDICAL HISTORY: Past Medical History  Diagnosis Date  . Type II or unspecified type diabetes mellitus without mention of complication, uncontrolled   . Other and unspecified hyperlipidemia   . Essential hypertension, benign   . Myocardial infarction   . Anxiety   . Depression   . Prostate cancer   . 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 Colonoscopy  2013    FAMILY HISTORY Family History  Problem Relation Age of Onset  . Adopted: Yes   ADVANCED DIRECTIVES:   SOCIAL HISTORY: History  Substance Use Topics  . Smoking status: Current Every Day Smoker -- 1.00 packs/day    Types: Cigarettes  . Smokeless tobacco: Never Used  . Alcohol Use: Yes     Comment:  28 12 ounce beers per week  Chronic smoker, half pack per day for about 30 years. Denies alcohol or recreational drug usage. Physically active and ambulatory.  Allergies  Allergen Reactions  . Codeine Sulfate Nausea And Vomiting    Current Outpatient Prescriptions  Medication Sig Dispense  Refill  . ALPRAZolam (XANAX) 0.5 MG tablet Take 0.5 mg by mouth at bedtime as needed for sleep.    Marland Kitchen amLODipine (NORVASC) 10 MG tablet Take 10 mg by mouth daily.    Marland Kitchen aspirin 81 MG tablet Take 81 mg by mouth daily.    Marland Kitchen atenolol (TENORMIN) 100 MG tablet Take 100 mg by mouth daily.    Marland Kitchen doxycycline (VIBRA-TABS) 100 MG tablet     . ibuprofen (ADVIL,MOTRIN) 200 MG tablet Take 200 mg by mouth every 6 (six) hours as needed for pain.    . isosorbide mononitrate (IMDUR) 30 MG 24 hr tablet Take 30 mg by mouth daily.    Marland Kitchen lisinopril (PRINIVIL,ZESTRIL) 20 MG tablet     . metFORMIN (GLUCOPHAGE) 500 MG tablet Take 500 mg by mouth 2 (two) times daily with a meal.    . Multiple Vitamins-Minerals (MEGA MULTI MEN PO) Take by mouth.    . nitroGLYCERIN (NITRODUR - DOSED IN MG/24 HR) 0.4 mg/hr patch Place 1 patch onto the skin daily.    . simvastatin (ZOCOR) 40 MG tablet Take 40 mg by mouth every evening.    . tamsulosin (FLOMAX) 0.4 MG CAPS capsule Take 1 capsule (0.4 mg total) by mouth daily after supper. 90 capsule 4  . Vitamin D, Ergocalciferol, (DRISDOL) 50000 UNITS CAPS capsule      No current facility-administered medications for this visit.    OBJECTIVE: Filed Vitals:   10/04/14 1434  BP: 168/92  Pulse: 60  Temp: 96.9 F (36.1 C)     Body mass index is 33.48 kg/(m^2).    ECOG FS:1 - Symptomatic but completely ambulatory  GENERAL: Patient is alert and oriented and in no acute distress. There is no icterus. HEENT: EOMs intact. No cervical lymphadenopathy. CVS: S1S2, regular LUNGS: Bilaterally clear to auscultation, no crepitations or rhonchi. ABDOMEN: Soft, nontender. No hepatosplenomegaly clinically.  NEURO: grossly nonfocal, cranial nerves are intact. Gait unremarkable. EXTREMITIES: No pedal edema.   LAB RESULTS: Lab Results  Component Value Date   WBC 6.6 05/14/2013   NEUTROABS 4.3 05/14/2013   HGB 14.3 05/14/2013   HCT 43.0 05/14/2013   MCV 97 05/14/2013   PLT 165 05/14/2013    May 2016 - repeat PSA > 100.   STUDIES: [facility] - serum PSA in May 2016 is > 100 (was 50.1 on 05/24/14, was 15.3 on 11/23/13).  Sept 2014 - Bone Scan. IMPRESSION:  Areas of increased activity in the lumbar spine and lumbosacral junction as well is in the knees and feet and shoulders. The  findings could represent bony degenerative change rather than metastatic disease.  Sept 2014 - CT scan of the abdomen/pelvis. IMPRESSION:  1. The prostate is borderline enlarged. There is calcification present. There are prominent and enlarged retroperitoneal lymph nodes. No definite bony metastatic lesions are seen but a bone scan is pending. Degenerative changes are seen in the spine.  2. Multiple tiny renal cysts appear to be present.  Oct 2014 - MRI lumbar spine. IMPRESSION:  Multilevel disc degeneration. Small L4-L5 central disc protrusion with minimal deformity thecal sac. L5-S1 small right  paracentral disc protrusion and facet hypertrophy resulting in mild narrowing of the right neural foramen at this  level.  06/28/14 - Bone scan. IMPRESSION: 1. Focal uptake within the right acetabular roof corresponds to degenerative change on comparison CT. 2. No evidence of skeletal metastasis. 3. Edema within the soft tissues of lower extremity.  06/28/14 - CT abdomen/pelvis. IMPRESSION: 1. Numerous pelvic and abdominal lymph nodes most of which are stable but some have definitely enlarged. This is worrisome for metastatic disease. 2. No definite sclerotic bone lesions to suggest osseous metastasis. Recommend correlation with bone scan. 3. Stable small hyperdense/hemorrhagic right renal cysts.    ASSESSMENT / PLAN:   1. TxNxMx Adenocarcinoma of the Prostate (Gleason grade 5+4 = 9) currently with rising PSA  (Initially diagnosed by prostate biopsy on 02/01/13 with Prostate Adenocarcinoma Gleason grade 5+4 = 9 and received external beam radiation treatment and followed by Dr.Chrystal, PSA on 11/24/13 had risen to  15.3 and received Lupron injection 30 mg 1 dose. Missed follow-up appointment in November. Most recent serum PSA on 05/24/14 increased to 50.1). Last radiological evaluation in Feb 2016: Bone scan does not show any definite bone metastasis but CT scan did show numerous pelvic and abdominal lymph nodes which have enlarged compared to prior scan.  -    reviewed recent labs done and d/w patient. Serum PSA has doubled to > 100 despite LHRH-agonist therapy with lupron and explained that this is concerning for progression of prostate cancer and failure of lupron therapy. Given his symptoms, will pursue repeat Bone scan to look for new bone mets and also CT chest/abdomen/pelvis for evaluation of progression of metastatic prostate cancer. Have discussed further treatment options and again explained at this stage it is stage IV disease which is incurable and treatments offered are with palliative intent only. Have also explained available clinical trial which is looking at Abiraterone versus Abiraterone+Enzalutamide. He is interested in this and will be screened for eligibility. Will see him back on May 31 and plan continued treatment including next dose of Lupron.  2. Pain - patient states he has difficulty tolerating most opioids and does not even want Tramadol at this time, states he will consider it if pain worsens.  3. In between visits, he was advised to call or come to ER in case of any worsening pain, new symptoms or sickness. He is agreeable to this plan.   Leia Alf, MD   10/13/2014 11:35 AM

## 2014-10-15 ENCOUNTER — Ambulatory Visit: Payer: 59 | Admitting: Internal Medicine

## 2014-10-18 ENCOUNTER — Inpatient Hospital Stay: Payer: 59

## 2014-10-18 ENCOUNTER — Telehealth: Payer: Self-pay | Admitting: *Deleted

## 2014-10-18 ENCOUNTER — Inpatient Hospital Stay: Payer: 59 | Attending: Internal Medicine | Admitting: Internal Medicine

## 2014-10-18 VITALS — BP 126/74 | HR 59 | Temp 98.2°F | Resp 18 | Ht 67.0 in | Wt 211.9 lb

## 2014-10-18 DIAGNOSIS — C7951 Secondary malignant neoplasm of bone: Secondary | ICD-10-CM | POA: Insufficient documentation

## 2014-10-18 DIAGNOSIS — Z923 Personal history of irradiation: Secondary | ICD-10-CM | POA: Insufficient documentation

## 2014-10-18 DIAGNOSIS — R609 Edema, unspecified: Secondary | ICD-10-CM | POA: Diagnosis not present

## 2014-10-18 DIAGNOSIS — F329 Major depressive disorder, single episode, unspecified: Secondary | ICD-10-CM | POA: Diagnosis not present

## 2014-10-18 DIAGNOSIS — Z7982 Long term (current) use of aspirin: Secondary | ICD-10-CM | POA: Diagnosis not present

## 2014-10-18 DIAGNOSIS — E785 Hyperlipidemia, unspecified: Secondary | ICD-10-CM

## 2014-10-18 DIAGNOSIS — I252 Old myocardial infarction: Secondary | ICD-10-CM | POA: Insufficient documentation

## 2014-10-18 DIAGNOSIS — C61 Malignant neoplasm of prostate: Secondary | ICD-10-CM

## 2014-10-18 DIAGNOSIS — F17210 Nicotine dependence, cigarettes, uncomplicated: Secondary | ICD-10-CM | POA: Diagnosis not present

## 2014-10-18 DIAGNOSIS — I10 Essential (primary) hypertension: Secondary | ICD-10-CM | POA: Diagnosis not present

## 2014-10-18 DIAGNOSIS — F419 Anxiety disorder, unspecified: Secondary | ICD-10-CM | POA: Diagnosis not present

## 2014-10-18 DIAGNOSIS — R599 Enlarged lymph nodes, unspecified: Secondary | ICD-10-CM | POA: Diagnosis not present

## 2014-10-18 DIAGNOSIS — Z79899 Other long term (current) drug therapy: Secondary | ICD-10-CM | POA: Diagnosis not present

## 2014-10-18 DIAGNOSIS — M545 Low back pain: Secondary | ICD-10-CM | POA: Insufficient documentation

## 2014-10-18 DIAGNOSIS — R972 Elevated prostate specific antigen [PSA]: Secondary | ICD-10-CM | POA: Insufficient documentation

## 2014-10-18 DIAGNOSIS — E119 Type 2 diabetes mellitus without complications: Secondary | ICD-10-CM | POA: Insufficient documentation

## 2014-10-18 MED ORDER — LEUPROLIDE ACETATE (4 MONTH) 30 MG IM KIT
30.0000 mg | PACK | Freq: Once | INTRAMUSCULAR | Status: AC
  Administered 2014-10-18: 30 mg via INTRAMUSCULAR
  Filled 2014-10-18: qty 30

## 2014-10-18 NOTE — Telephone Encounter (Signed)
Clinical Research Note: I called Sean Petersen this morning to clarify  his documented adverse reaction of "vomiting" to prednisone.  Dr. Ma Hillock placed a referral last week and in follow-up I found documentation that the patient experienced vomiting from prednisone. The protocol under consideration uses prednisone in one of the arms.  The patient was unavailable when I called but his wife offered to speak with me.  She indicated that her husband does experience significant vomiting with prednisone.  Because the patient does have a reaction to prednisone and given that there is a possibility he could randomize to the arm of the study using prednisone, compliance could be an issue.    Jake Samples, RN

## 2014-10-21 ENCOUNTER — Encounter: Payer: Self-pay | Admitting: *Deleted

## 2014-10-21 DIAGNOSIS — C61 Malignant neoplasm of prostate: Secondary | ICD-10-CM

## 2014-10-21 NOTE — Progress Notes (Signed)
Research Encounter-Per attending request I was asked to see the patient Friday 10/18/2014 to discuss his participation in the [facility] [id number] protocol using Enzalutamine vs Enzalutamide, Abiraterone, and Prednisone for castration resistant metastatic prostate cancer.  I found the patient to be in distress due to pain.  The patient reported his pain as 10 of 10.  He was extremely anxious and told me he was unable to sit due to pain.  It was difficult keeping his attention while trying to consent.  He was unable to concentrate and make any decisions so we agreed that he needed to pick up his Tramadol prescription, get his pain under control, read the consent form over the weekend, and I would call him on Monday.   I placed a follow-up phone call  today to determine if the patient's  pain was under control and discuss the clinical trial.    He reported that the Tramadol had not touched his pain.  He did have time to read the consent form and he has declined the clinical trial.  I informed Dr. Ma Hillock and his care team of the patient's decision.   Jake Samples, RN

## 2014-10-23 ENCOUNTER — Telehealth: Payer: Self-pay | Admitting: *Deleted

## 2014-10-23 NOTE — Telephone Encounter (Signed)
Pt called stating that the tramadol for leg pain is not working at all, he is nauseated with the med also.  He needs something else for pain.  He also talked to his HR rep at [employer] and they felt he needs short term disability because he will need to see how he feels on this new med.  And he is having so much pain.  Pt will be given pain med.  And pt states that in 1982 when he had surgery at [facility] that he could only take morphine and it helped with pain and he does not remember anything else.  He can come over tom and pick up new rx and dr pandit wanted him to see dr Baruch Gouty for radiation and appt given for 6/10 at 8:30 and pt will be there.

## 2014-10-25 ENCOUNTER — Encounter: Payer: Self-pay | Admitting: Radiation Oncology

## 2014-10-25 ENCOUNTER — Other Ambulatory Visit: Payer: Self-pay | Admitting: *Deleted

## 2014-10-25 ENCOUNTER — Ambulatory Visit
Admission: RE | Admit: 2014-10-25 | Discharge: 2014-10-25 | Disposition: A | Discharge status: Home / Self Care | Payer: 59 | Source: Ambulatory Visit | Attending: Radiation Oncology | Admitting: Radiation Oncology

## 2014-10-25 VITALS — BP 136/81 | HR 62 | Temp 97.1°F | Resp 20 | Wt 217.4 lb

## 2014-10-25 DIAGNOSIS — Z510 Encounter for antineoplastic radiation therapy: Secondary | ICD-10-CM | POA: Insufficient documentation

## 2014-10-25 DIAGNOSIS — C61 Malignant neoplasm of prostate: Secondary | ICD-10-CM | POA: Insufficient documentation

## 2014-10-25 DIAGNOSIS — C7951 Secondary malignant neoplasm of bone: Secondary | ICD-10-CM

## 2014-10-25 NOTE — Progress Notes (Signed)
Radiation Oncology Follow up Note  Name: Sean Petersen   Date:   10/25/2014 MRN:  [id number] DOB: [birth year]    This 59 y.o. male presents to the clinic today for follow-up for prostate cancer now stage IV.  REFERRING PROVIDER: Tracie Harrier, MD  HPI: Patient is a 59 year old male well known to our department initially diagnosed with Gleason 9 (5+4) adenocarcinoma the prostate diagnosed in September 2014. He's been followed by Dr Sean Petersen for rising PSA which was 15.3 back in July 2015 most recently noted in general 2016 to increase to 50 despite Lupron therapy. He's also been complaining of back pain recent bone scan shows widespread bony metastasis with a large lesion at the L2-3 region. He is having radicular type lower back pain with pain radiating into his lower extremities bilaterally. Patient has been hesitant to use narcotic analgesia 6 is really using no pain medication at this time. I been asked to evaluate him for possible palliative radiation therapy to his spine. He is ambulating well at this time..  COMPLICATIONS OF TREATMENT: none  FOLLOW UP COMPLIANCE: keeps appointments   PHYSICAL EXAM:  BP 136/81 mmHg  Pulse 62  Temp(Src) 97.1 F (36.2 C)  Resp 20  Wt 217 lb 6 oz (98.6 kg) Well-developed male in NAD. Range of motion of his lower extremities does not elicit pain motor sensory and DTR levels are equal and symmetric in the lower extremities bilaterally. Deep palpation of his lumbar spine doesn't elicit some mild pain. Proprioception is intact bilaterally. Well-developed well-nourished patient in NAD. HEENT reveals PERLA, EOMI, discs not visualized.  Oral cavity is clear. No oral mucosal lesions are identified. Neck is clear without evidence of cervical or supraclavicular adenopathy. Lungs are clear to A&P. Cardiac examination is essentially unremarkable with regular rate and rhythm without murmur rub or thrill. Abdomen is benign with no organomegaly or masses noted. Motor  sensory and DTR levels are equal and symmetric in the upper and lower extremities. Cranial nerves II through XII are grossly intact. Proprioception is intact. No peripheral adenopathy or edema is identified. No motor or sensory levels are noted. Crude visual fields are within normal range.   RADIOLOGY RESULTS: Bone scan is reviewed.  PLAN: At this time I discussed the case personally with medical oncology. Patient will be started on other stage IV prostate cancer regimens including Zytiga as well as possible Taxol chemotherapy and prednisone. I like to start palliative radiation therapy on his lumbar spine and plan on 3000 cGy in 10 fractions. Risks and benefits of treatment including possible GI side effects such as diarrhea, fatigue, skin reaction, alteration of blood counts all were discussed in detail with the patient. I have set up and ordered CT simulation early next week. Other possibility down the road would beXofigo treatment and we did briefly discuss that.  I would like to take this opportunity for allowing me to participate in the care of your patient.Sean Petersen., MD

## 2014-10-30 ENCOUNTER — Ambulatory Visit
Admission: RE | Admit: 2014-10-30 | Discharge: 2014-10-30 | Disposition: A | Discharge status: Home / Self Care | Payer: 59 | Source: Ambulatory Visit

## 2014-10-30 DIAGNOSIS — Z510 Encounter for antineoplastic radiation therapy: Secondary | ICD-10-CM | POA: Diagnosis not present

## 2014-10-30 DIAGNOSIS — C61 Malignant neoplasm of prostate: Secondary | ICD-10-CM | POA: Diagnosis present

## 2014-10-31 DIAGNOSIS — C61 Malignant neoplasm of prostate: Secondary | ICD-10-CM | POA: Diagnosis not present

## 2014-11-01 ENCOUNTER — Other Ambulatory Visit: Payer: Self-pay | Admitting: *Deleted

## 2014-11-01 DIAGNOSIS — C61 Malignant neoplasm of prostate: Secondary | ICD-10-CM

## 2014-11-04 ENCOUNTER — Other Ambulatory Visit: Payer: Self-pay | Admitting: Internal Medicine

## 2014-11-04 ENCOUNTER — Ambulatory Visit
Admission: RE | Admit: 2014-11-04 | Discharge: 2014-11-04 | Disposition: A | Discharge status: Home / Self Care | Payer: 59 | Source: Ambulatory Visit | Attending: Radiation Oncology | Admitting: Radiation Oncology

## 2014-11-04 DIAGNOSIS — C61 Malignant neoplasm of prostate: Secondary | ICD-10-CM

## 2014-11-04 MED ORDER — PREDNISONE 5 MG PO TABS: 5.0000 mg | ORAL_TABLET | Freq: Two times a day (BID) | ORAL | Status: DC

## 2014-11-05 ENCOUNTER — Ambulatory Visit
Admission: RE | Admit: 2014-11-05 | Discharge: 2014-11-05 | Disposition: A | Discharge status: Home / Self Care | Payer: 59 | Source: Ambulatory Visit | Attending: Radiation Oncology | Admitting: Radiation Oncology

## 2014-11-05 DIAGNOSIS — C61 Malignant neoplasm of prostate: Secondary | ICD-10-CM | POA: Diagnosis not present

## 2014-11-06 ENCOUNTER — Ambulatory Visit
Admission: RE | Admit: 2014-11-06 | Discharge: 2014-11-06 | Disposition: A | Discharge status: Home / Self Care | Payer: 59 | Source: Ambulatory Visit | Attending: Radiation Oncology | Admitting: Radiation Oncology

## 2014-11-06 DIAGNOSIS — C61 Malignant neoplasm of prostate: Secondary | ICD-10-CM | POA: Diagnosis not present

## 2014-11-07 ENCOUNTER — Ambulatory Visit
Admission: RE | Admit: 2014-11-07 | Discharge: 2014-11-07 | Disposition: A | Discharge status: Home / Self Care | Payer: 59 | Source: Ambulatory Visit | Attending: Radiation Oncology | Admitting: Radiation Oncology

## 2014-11-07 DIAGNOSIS — C61 Malignant neoplasm of prostate: Secondary | ICD-10-CM | POA: Diagnosis not present

## 2014-11-07 NOTE — Progress Notes (Signed)
[facility]  Telephone:([phone] Fax:([phone]) [phone]     ID: FATIH STALVEY OB: 1955-09-07  MR#: [id number]  ZMO#:[id number]  Patient Care Team: Tracie Harrier, MD as PCP - General (Internal Medicine)  CHIEF COMPLAINT/DIAGNOSIS:  TxNxMx Adenocarcinoma of the Prostate (Gleason grade 5+4 = 9) currently with rising PSA -  (Initially diagnosed by prostate biopsy on 02/01/13 with Prostate Adenocarcinoma Gleason grade 5+4 = 9 and received external beam radiation treatment and followed by Dr.Chrystal, PSA on 11/24/13 had risen to 15.3 and received Lupron injection 30 mg 1 dose. Missed follow-up appointment in November. Most recent serum PSA on 05/24/14 increased to 50.1).  06/28/14 - Bone scan. IMPRESSION: 1. Focal uptake within the right acetabular roof corresponds to degenerative change on comparison CT. 2. No evidence of skeletal metastasis. 3. Edema within the soft tissues of lower extremity. 06/28/14 - CT abdomen/pelvis. IMPRESSION: 1. Numerous pelvic and abdominal lymph nodes most of which are stable but some have definitely enlarged. This is worrisome for metastatic disease. 2. No definite sclerotic bone lesions to suggest osseous metastasis. Recommend correlation with bone scan. 3. Stable small hyperdense/hemorrhagic right renal cysts.  Restarted on lupron by Dr.Chrystal on 06/14/14. May 2016 - PSA increased to >100 (was 50 in January). Repeat CT abdomen/pelvis shows stable enlarged lymphadenopathy. Repeat bone scan on 10/10/14 reports bone metastasis.  HISTORY OF PRESENT ILLNESS:  Patient returns for continued oncology followup, he had repeat radiological staging done and bone scan on May 26 reports bone metastasis. He continues to have stable enlarged lymph nodes on CT scan of the abdomen and pelvis. Clinically states that he has significant pain in the lower back and right adjacent buttock area. Denies any muscle weakness in lower extremities, denies any urinary or fecal  incontinence or retention. He is on androgen deprivation therapy with Lupron. He had recent labs at [facility] which shows PSA is doubled at > 100, was around 50 in January.  No dysuria or hematuria. Denies any new cough, chest pain, or hemoptysis. No fevers. Appetite is good. Remains physically active.   REVIEW OF SYSTEMS:   ROS  As in HPI above. In addition, no fever, chills or sweats. No new headaches or focal weakness.  No new mood disturbances. No  sore throat, cough, shortness of breath, sputum, hemoptysis or chest pain. No dizziness or palpitation. No abdominal pain, constipation, diarrhea, dysuria or hematuria. No new skin rash or bleeding symptoms. No new paresthesias in extremities. PS ECOG 1.  PAST MEDICAL HISTORY: Past Medical History  Diagnosis Date  . Type II or unspecified type diabetes mellitus without mention of complication, uncontrolled   . Other and unspecified hyperlipidemia   . Essential hypertension, benign   . Myocardial infarction   . Anxiety   . Depression   . Prostate cancer   . 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 Colonoscopy  2013    FAMILY HISTORY Family History  Problem Relation Age of Onset  . Adopted: Yes   ADVANCED DIRECTIVES:   SOCIAL HISTORY: History  Substance Use Topics  . Smoking status: Current Every Day Smoker --  1.00 packs/day    Types: Cigarettes  . Smokeless tobacco: Never Used  . Alcohol Use: Yes     Comment: 28 12 ounce beers per week  Chronic smoker, half pack per day for about 30 years. Denies alcohol or recreational drug usage. Physically active and ambulatory.  Allergies  Allergen  Reactions  . Codeine Sulfate Nausea And Vomiting  . Prednisone Nausea And Vomiting    Current Outpatient Prescriptions  Medication Sig Dispense Refill  . albuterol (PROAIR HFA) 108 (90 BASE) MCG/ACT inhaler Inhale into the lungs.    . ALPRAZolam (XANAX) 0.5 MG tablet Take 0.5 mg by mouth at bedtime as needed for sleep.    Marland Kitchen amLODipine (NORVASC) 10 MG tablet Take 10 mg by mouth daily.    Marland Kitchen aspirin 81 MG tablet Take 81 mg by mouth daily.    Marland Kitchen atenolol (TENORMIN) 100 MG tablet Take 100 mg by mouth daily.    Marland Kitchen doxycycline (VIBRA-TABS) 100 MG tablet     . gabapentin (NEURONTIN) 100 MG capsule Take by mouth.    Marland Kitchen ibuprofen (ADVIL,MOTRIN) 200 MG tablet Take 200 mg by mouth every 6 (six) hours as needed for pain.    . isosorbide mononitrate (IMDUR) 30 MG 24 hr tablet Take 30 mg by mouth daily.    Marland Kitchen lisinopril (PRINIVIL,ZESTRIL) 20 MG tablet     . metFORMIN (GLUCOPHAGE) 500 MG tablet Take 500 mg by mouth 2 (two) times daily with a meal.    . Multiple Vitamins-Minerals (MEGA MULTI MEN PO) Take by mouth.    . nitroGLYCERIN (NITRODUR - DOSED IN MG/24 HR) 0.4 mg/hr patch Place 1 patch onto the skin daily.    Marland Kitchen Phenyleph-Promethazine-Cod (PROMETHAZINE VC/CODEINE) 5-6.25-10 MG/5ML SYRP 1-2 TSP PO Q 6 HRS PRN    . predniSONE (DELTASONE) 5 MG tablet Take 1 tablet (5 mg total) by mouth 2 (two) times daily with a meal. 60 tablet 6  . simvastatin (ZOCOR) 40 MG tablet Take 40 mg by mouth every evening.    . tamsulosin (FLOMAX) 0.4 MG CAPS capsule Take 1 capsule (0.4 mg total) by mouth daily after supper. 90 capsule 4  . Vitamin D, Ergocalciferol, (DRISDOL) 50000 UNITS CAPS capsule      No current facility-administered medications for this visit.    OBJECTIVE: Filed Vitals:   10/18/14 1559  BP:   Pulse:   Temp:   Resp: 18     Body mass index is 33.17 kg/(m^2).    ECOG FS:1 - Symptomatic but completely ambulatory  GENERAL: Patient is alert and oriented and in no acute distress. There is no  icterus. HEENT: EOMs intact. No cervical lymphadenopathy. CVS: S1S2, regular LUNGS: Bilaterally clear to auscultation, no crepitations or rhonchi. ABDOMEN: Soft, nontender. No hepatosplenomegaly clinically.  NEURO: grossly nonfocal, cranial nerves are intact. Gait unremarkable. EXTREMITIES: No pedal edema.   LAB RESULTS: Lab Results  Component Value Date   WBC 6.6 05/14/2013   NEUTROABS 4.3 05/14/2013   HGB 14.3 05/14/2013   HCT 43.0 05/14/2013   MCV 97 05/14/2013   PLT 165 05/14/2013   May 2016 - repeat PSA > 100.   STUDIES: [facility] - serum PSA in May 2016 is > 100 (was 50.1 on 05/24/14, was 15.3 on 11/23/13).  Sept 2014 - Bone Scan. IMPRESSION:  Areas of increased activity in the lumbar spine and lumbosacral junction as well is in the knees and feet and shoulders. The  findings could represent bony degenerative change rather than metastatic disease.  Sept 2014 -  CT scan of the abdomen/pelvis. IMPRESSION:  1. The prostate is borderline enlarged. There is calcification present. There are prominent and enlarged retroperitoneal lymph nodes. No definite bony metastatic lesions are seen but a bone scan is pending. Degenerative changes are seen in the spine.  2. Multiple tiny renal cysts appear to be present.  Oct 2014 - MRI lumbar spine. IMPRESSION:  Multilevel disc degeneration. Small L4-L5 central disc protrusion with minimal deformity thecal sac. L5-S1 small right  paracentral disc protrusion and facet hypertrophy resulting in mild narrowing of the right neural foramen at this level.  06/28/14 - Bone scan. IMPRESSION: 1. Focal uptake within the right acetabular roof corresponds to degenerative change on comparison CT. 2. No evidence of skeletal metastasis. 3. Edema within the soft tissues of lower extremity.  06/28/14 - CT abdomen/pelvis. IMPRESSION: 1. Numerous pelvic and abdominal lymph nodes most of which are stable but some have definitely enlarged. This is worrisome for  metastatic disease. 2. No definite sclerotic bone lesions to suggest osseous metastasis. Recommend correlation with bone scan. 3. Stable small hyperdense/hemorrhagic right renal cysts.  10/10/2014 - Bone Scan. IMPRESSION: 1. New focus of increased uptake in the body of L3, in the posterior superior aspect of the right scapular body, and the left frontal bone. Plain films are recommended. 2. New increased uptake along the anterior aspects of the left sixth, seventh, and tenth ribs and in the anterior aspect of the right seventh rib. The findings may be post traumatic. Is there tenderness over the anterior ribs? Correlation with plain films would be useful.  10/11/2014 - CT scan of abdomen/pelvis. IMPRESSION: The numerous abdominal and upper pelvic lymph nodes are stable in size since 06/28/2014, but remained enlargement comparing back to 02/02/2013. Upper abdominal lymph nodes are borderline to mildly increased for size while retroperitoneal and upper pelvic lymph nodes are normal upper normal in size. 3 months of imaging stability is reassuring, but metastatic disease remains a consideration. Stable appearance of complex cysts in the lower pole the right kidney, likely hemorrhagic or proteinaceous.     ASSESSMENT / PLAN:   1. 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 Missed follow-up appointment in November. Most recent serum PSA on 05/24/14 increased to 50.1). Last radiological evaluation in Feb 2016: Bone scan does not show any definite bone metastasis but CT scan did show numerous pelvic and abdominal lymph nodes which have enlarged compared to prior scan.  -    reviewed recent labs, also independently  reviewed CT scan and bone scan and discussed scans with patient. Have explained that he does have evidence of progressive metastatic disease on bone scan and that we will need to pursue further treatment options, discussed options available and also explained available ongoing clinical trial which is looking at Abiraterone versus Abiraterone+Enzalutamide. Patient is interested in looking over clinical trial information and has been provided information. He will call us if he would like to participate and will then be screened for eligibility. For pain, he does not want to take stronger pain medication and is agreeable only to try Tramadol, we will try 50-100 mg every 6 hours when necessary. Also will consider palliative radiation if pain is not controlled in the next few days.  Will see him back after he makes a decision regarding clinical trial and make further plan of management. Proceed with next dose of Lupron. Patient has been explained that given the aggressive metastatic disease and rapid rise of PSA, he has stage IV disease which is incurable, treatments offered on with palliative intent only, and that overall prognosis is poor especially if he does not respond to treatment.  2. Pain - patient states he has difficulty tolerating most opioids. Plan is as above.   3. In between visits, he was advised to call or come to ER in case of any worsening pain, new symptoms or sickness. He is agreeable to this plan.   Leia Alf, MD   11/07/2014 4:43 PM

## 2014-11-07 NOTE — Progress Notes (Signed)
Informed that patient unable to participate on clinical trial, therefore prescription was sent for [facility] but his insurance company will not approve this until he first fails treatment with Zytiga + Prednisone. Therefore I have sent a prescription for Prednisone 5 mg twice a day and Zytiga 1000 mg by mouth daily. Lab and MD appointments have been ordered.

## 2014-11-08 ENCOUNTER — Other Ambulatory Visit: Payer: Self-pay

## 2014-11-08 ENCOUNTER — Ambulatory Visit
Admission: RE | Admit: 2014-11-08 | Discharge: 2014-11-08 | Disposition: A | Discharge status: Home / Self Care | Payer: 59 | Source: Ambulatory Visit

## 2014-11-08 ENCOUNTER — Other Ambulatory Visit: Payer: Self-pay | Admitting: Family Medicine

## 2014-11-08 DIAGNOSIS — C61 Malignant neoplasm of prostate: Secondary | ICD-10-CM | POA: Diagnosis not present

## 2014-11-08 MED ORDER — MORPHINE SULFATE (CONCENTRATE) 20 MG/ML PO SOLN: 10.0000 mg | ORAL | Status: DC | PRN

## 2014-11-11 ENCOUNTER — Ambulatory Visit: Admission: RE | Admit: 2014-11-11 | Payer: 59 | Source: Ambulatory Visit

## 2014-11-11 ENCOUNTER — Ambulatory Visit
Admission: RE | Admit: 2014-11-11 | Discharge: 2014-11-11 | Disposition: A | Discharge status: Home / Self Care | Payer: 59 | Source: Ambulatory Visit | Attending: Radiation Oncology | Admitting: Radiation Oncology

## 2014-11-11 ENCOUNTER — Inpatient Hospital Stay: Payer: 59

## 2014-11-12 ENCOUNTER — Ambulatory Visit: Payer: 59

## 2014-11-12 ENCOUNTER — Ambulatory Visit: Payer: 59 | Admitting: Radiation Oncology

## 2014-11-13 ENCOUNTER — Other Ambulatory Visit: Payer: Self-pay | Admitting: *Deleted

## 2014-11-13 ENCOUNTER — Ambulatory Visit
Admission: RE | Admit: 2014-11-13 | Discharge: 2014-11-13 | Disposition: A | Discharge status: Home / Self Care | Payer: 59 | Source: Ambulatory Visit | Attending: Radiation Oncology | Admitting: Radiation Oncology

## 2014-11-13 DIAGNOSIS — C61 Malignant neoplasm of prostate: Secondary | ICD-10-CM | POA: Diagnosis not present

## 2014-11-14 ENCOUNTER — Ambulatory Visit
Admission: RE | Admit: 2014-11-14 | Discharge: 2014-11-14 | Disposition: A | Discharge status: Home / Self Care | Payer: 59 | Source: Ambulatory Visit | Attending: Radiation Oncology | Admitting: Radiation Oncology

## 2014-11-14 ENCOUNTER — Other Ambulatory Visit: Payer: 59

## 2014-11-14 DIAGNOSIS — C61 Malignant neoplasm of prostate: Secondary | ICD-10-CM | POA: Diagnosis not present

## 2014-11-15 ENCOUNTER — Ambulatory Visit
Admission: RE | Admit: 2014-11-15 | Discharge: 2014-11-15 | Disposition: A | Discharge status: Home / Self Care | Payer: 59 | Source: Ambulatory Visit | Attending: Radiation Oncology | Admitting: Radiation Oncology

## 2014-11-15 DIAGNOSIS — C61 Malignant neoplasm of prostate: Secondary | ICD-10-CM | POA: Diagnosis not present

## 2014-11-19 ENCOUNTER — Ambulatory Visit
Admission: RE | Admit: 2014-11-19 | Discharge: 2014-11-19 | Disposition: A | Discharge status: Home / Self Care | Payer: 59 | Source: Ambulatory Visit | Attending: Radiation Oncology | Admitting: Radiation Oncology

## 2014-11-19 DIAGNOSIS — C61 Malignant neoplasm of prostate: Secondary | ICD-10-CM | POA: Diagnosis not present

## 2014-11-20 ENCOUNTER — Ambulatory Visit: Admission: RE | Admit: 2014-11-20 | Payer: 59 | Source: Ambulatory Visit

## 2014-11-21 ENCOUNTER — Ambulatory Visit
Admission: RE | Admit: 2014-11-21 | Discharge: 2014-11-21 | Disposition: A | Discharge status: Home / Self Care | Payer: 59 | Source: Ambulatory Visit | Attending: Radiation Oncology | Admitting: Radiation Oncology

## 2014-11-21 DIAGNOSIS — C61 Malignant neoplasm of prostate: Secondary | ICD-10-CM | POA: Diagnosis not present

## 2014-11-22 ENCOUNTER — Ambulatory Visit
Admission: RE | Admit: 2014-11-22 | Discharge: 2014-11-22 | Disposition: A | Discharge status: Home / Self Care | Payer: 59 | Source: Ambulatory Visit | Attending: Radiation Oncology | Admitting: Radiation Oncology

## 2014-11-22 DIAGNOSIS — C61 Malignant neoplasm of prostate: Secondary | ICD-10-CM | POA: Diagnosis not present

## 2014-11-25 ENCOUNTER — Telehealth: Payer: Self-pay | Admitting: *Deleted

## 2014-11-25 ENCOUNTER — Other Ambulatory Visit: Payer: Self-pay | Admitting: *Deleted

## 2014-11-25 DIAGNOSIS — C61 Malignant neoplasm of prostate: Secondary | ICD-10-CM

## 2014-11-25 NOTE — Telephone Encounter (Signed)
Pt states that his disability short term needs to be extended.  His leg leg is better and aright leg is worse and he has to take morphine to help relieve pain and when he takes it he can't drive because of the side effects.  He has now start zytiga 2 days ago and wonders about his f/u appt.  Checked with md and he can bring his forms in and we will refill them.  Will make appt for labs in 2 weeks and see md in 4 weeks.  Will put appt in mail to pt. He also feels like he will need refill of pain med tom. When he comes.  He has at the most 3 days supply.

## 2014-11-27 ENCOUNTER — Other Ambulatory Visit: Payer: Self-pay | Admitting: Internal Medicine

## 2014-11-27 DIAGNOSIS — C61 Malignant neoplasm of prostate: Secondary | ICD-10-CM

## 2014-11-27 MED ORDER — MORPHINE SULFATE (CONCENTRATE) 20 MG/ML PO SOLN: ORAL | Status: DC

## 2014-12-01 ENCOUNTER — Telehealth: Payer: Self-pay | Admitting: *Deleted

## 2014-12-01 NOTE — Telephone Encounter (Signed)
Faxed flma extension to [employer] for pt.  Pt had stated that it needs to be done by Monday 7/18.

## 2014-12-09 ENCOUNTER — Inpatient Hospital Stay: Payer: 59 | Attending: Internal Medicine

## 2014-12-09 DIAGNOSIS — C61 Malignant neoplasm of prostate: Secondary | ICD-10-CM | POA: Insufficient documentation

## 2014-12-09 LAB — CBC WITH DIFFERENTIAL/PLATELET
Basophils Absolute: 0 10*3/uL (ref 0–0.1)
Basophils Relative: 1 %
Eosinophils Absolute: 0.3 10*3/uL (ref 0–0.7)
Eosinophils Relative: 4 %
HCT: 41 % (ref 40.0–52.0)
HEMOGLOBIN: 14 g/dL (ref 13.0–18.0)
LYMPHS ABS: 0.6 10*3/uL — AB (ref 1.0–3.6)
Lymphocytes Relative: 9 %
MCH: 32.5 pg (ref 26.0–34.0)
MCHC: 34.1 g/dL (ref 32.0–36.0)
MCV: 95.3 fL (ref 80.0–100.0)
MONOS PCT: 13 %
Monocytes Absolute: 0.9 10*3/uL (ref 0.2–1.0)
NEUTROS ABS: 5.1 10*3/uL (ref 1.4–6.5)
Neutrophils Relative %: 73 %
Platelets: 198 10*3/uL (ref 150–440)
RBC: 4.3 MIL/uL — AB (ref 4.40–5.90)
RDW: 13.4 % (ref 11.5–14.5)
WBC: 6.9 10*3/uL (ref 3.8–10.6)

## 2014-12-09 LAB — BASIC METABOLIC PANEL
ANION GAP: 6 (ref 5–15)
BUN: 16 mg/dL (ref 6–20)
CO2: 25 mmol/L (ref 22–32)
CREATININE: 1.11 mg/dL (ref 0.61–1.24)
Calcium: 8.5 mg/dL — ABNORMAL LOW (ref 8.9–10.3)
Chloride: 101 mmol/L (ref 101–111)
GFR calc Af Amer: >60 mL/min (ref >60)
GFR calc non Af Amer: >60 mL/min (ref >60)
Glucose, Bld: 205 mg/dL — ABNORMAL HIGH (ref 65–99)
Potassium: 5 mmol/L (ref 3.5–5.1)
SODIUM: 132 mmol/L — AB (ref 135–145)

## 2014-12-09 LAB — MAGNESIUM: MAGNESIUM: 2.2 mg/dL (ref 1.7–2.4)

## 2014-12-09 LAB — PHOSPHORUS: Phosphorus: 3.2 mg/dL (ref 2.5–4.6)

## 2014-12-20 ENCOUNTER — Encounter: Payer: Self-pay | Admitting: Radiation Oncology

## 2014-12-20 ENCOUNTER — Ambulatory Visit
Admission: RE | Admit: 2014-12-20 | Discharge: 2014-12-20 | Disposition: A | Discharge status: Home / Self Care | Payer: 59 | Source: Ambulatory Visit | Attending: Radiation Oncology | Admitting: Radiation Oncology

## 2014-12-20 VITALS — BP 129/74 | HR 57 | Temp 95.9°F | Resp 20 | Wt 210.0 lb

## 2014-12-20 DIAGNOSIS — C7951 Secondary malignant neoplasm of bone: Secondary | ICD-10-CM | POA: Insufficient documentation

## 2014-12-20 DIAGNOSIS — C61 Malignant neoplasm of prostate: Secondary | ICD-10-CM | POA: Insufficient documentation

## 2014-12-20 DIAGNOSIS — Z510 Encounter for antineoplastic radiation therapy: Secondary | ICD-10-CM | POA: Insufficient documentation

## 2014-12-20 NOTE — Progress Notes (Signed)
Radiation Oncology Follow up Note  Name: Sean Petersen   Date:   12/20/2014 MRN:  [id number] DOB: [birth year]    This 59 y.o. male presents to the clinic today for follow-up for stage IV prostate cancer with bone metastases.  REFERRING PROVIDER: Tracie Harrier, MD  HPI: patient is a 59 year old male well known to our department initially treated for a Gleason 9 (4+5) adenocarcinoma the prostate diagnosed in September 2014. He had been treated with curative radiation therapy although presents with biochemical failure your 1 year ago in July 2015. He has been on Lupron therapy. We completed his lumbar spine for metastatic disease 1 month prior he is seen today in routine follow-up. Left radicular leg pain is completely resolved. He is having significant right hip pain. His bone scan that show disease in the right acetabular region. He is on narcotic analgesia 6 at this time..he is currently on Zytiga and prednisone underDr Pandit direction.  COMPLICATIONS OF TREATMENT: none  FOLLOW UP COMPLIANCE: keeps appointments   PHYSICAL EXAM:  BP 129/74 mmHg  Pulse 57  Temp(Src) 95.9 F (35.5 C)  Resp 20  Wt 209 lb 15.8 oz (95.25 kg) Well-developed slightly obese male in NAD. Motor strength in his right lower extremity is decreased secondary to pain. Range of motion right lower extremity 6 exhibits significant pain. Deep palpation of his lumbar spine does not elicit pain. Range of motion of his left lower extremity does not elicit any pain. No sensory level is noted. Well-developed well-nourished patient in NAD. HEENT reveals PERLA, EOMI, discs not visualized.  Oral cavity is clear. No oral mucosal lesions are identified. Neck is clear without evidence of cervical or supraclavicular adenopathy. Lungs are clear to A&P. Cardiac examination is essentially unremarkable with regular rate and rhythm without murmur rub or thrill. Abdomen is benign with no organomegaly or masses noted. Motor sensory and DTR  levels are equal and symmetric in the upper and lower extremities. Cranial nerves II through XII are grossly intact. Proprioception is intact. No peripheral adenopathy or edema is identified. No motor or sensory levels are noted. Crude visual fields are within normal range.   RADIOLOGY RESULTS: bone scan is reviewed compatible with the above-stated findings.  PLAN: at this time of with palliative radiation therapy to his right hip. Would plan on delivering 3000 cGy in 10 fractions. I have set up and ordered CT simulation next week on the patient. Risks and benefits of treatment including skin reaction fatigue all were discussed with the patient. He Returns her treatment plan well. Also consideration down the road may be forXofigo treatments. All this was explained to patient. He also continues close follow-up care with medical oncology.  I would like to take this opportunity for allowing me to participate in the care of your patient.Armstead Peaks., MD

## 2014-12-20 NOTE — Progress Notes (Signed)
Pt is no longer having pain in his left leg but is still having severe pain in his right leg from hip down to knee.

## 2014-12-23 ENCOUNTER — Inpatient Hospital Stay: Payer: 59

## 2014-12-23 ENCOUNTER — Inpatient Hospital Stay: Payer: 59 | Admitting: Internal Medicine

## 2014-12-27 ENCOUNTER — Inpatient Hospital Stay: Payer: 59

## 2014-12-27 ENCOUNTER — Inpatient Hospital Stay: Payer: 59 | Attending: Internal Medicine | Admitting: Internal Medicine

## 2014-12-27 VITALS — BP 129/75 | HR 53 | Temp 97.0°F | Resp 18 | Ht 67.0 in | Wt 214.9 lb

## 2014-12-27 DIAGNOSIS — I252 Old myocardial infarction: Secondary | ICD-10-CM | POA: Diagnosis not present

## 2014-12-27 DIAGNOSIS — F329 Major depressive disorder, single episode, unspecified: Secondary | ICD-10-CM | POA: Insufficient documentation

## 2014-12-27 DIAGNOSIS — F17210 Nicotine dependence, cigarettes, uncomplicated: Secondary | ICD-10-CM | POA: Diagnosis not present

## 2014-12-27 DIAGNOSIS — E785 Hyperlipidemia, unspecified: Secondary | ICD-10-CM | POA: Diagnosis not present

## 2014-12-27 DIAGNOSIS — E119 Type 2 diabetes mellitus without complications: Secondary | ICD-10-CM | POA: Insufficient documentation

## 2014-12-27 DIAGNOSIS — I10 Essential (primary) hypertension: Secondary | ICD-10-CM | POA: Diagnosis not present

## 2014-12-27 DIAGNOSIS — F419 Anxiety disorder, unspecified: Secondary | ICD-10-CM | POA: Diagnosis not present

## 2014-12-27 DIAGNOSIS — C61 Malignant neoplasm of prostate: Secondary | ICD-10-CM

## 2014-12-27 DIAGNOSIS — C7951 Secondary malignant neoplasm of bone: Secondary | ICD-10-CM

## 2014-12-27 DIAGNOSIS — Z79899 Other long term (current) drug therapy: Secondary | ICD-10-CM | POA: Diagnosis not present

## 2014-12-27 DIAGNOSIS — Z7982 Long term (current) use of aspirin: Secondary | ICD-10-CM | POA: Diagnosis not present

## 2014-12-27 LAB — CBC WITH DIFFERENTIAL/PLATELET
BASOS ABS: 0 10*3/uL (ref 0–0.1)
Basophils Relative: 1 %
EOS PCT: 3 %
Eosinophils Absolute: 0.2 10*3/uL (ref 0–0.7)
HCT: 39.6 % — ABNORMAL LOW (ref 40.0–52.0)
HEMOGLOBIN: 13.6 g/dL (ref 13.0–18.0)
LYMPHS ABS: 0.6 10*3/uL — AB (ref 1.0–3.6)
LYMPHS PCT: 9 %
MCH: 33 pg (ref 26.0–34.0)
MCHC: 34.4 g/dL (ref 32.0–36.0)
MCV: 96 fL (ref 80.0–100.0)
MONO ABS: 0.7 10*3/uL (ref 0.2–1.0)
Monocytes Relative: 11 %
Neutro Abs: 5.2 10*3/uL (ref 1.4–6.5)
Neutrophils Relative %: 76 %
Platelets: 195 10*3/uL (ref 150–440)
RBC: 4.12 MIL/uL — ABNORMAL LOW (ref 4.40–5.90)
RDW: 14.2 % (ref 11.5–14.5)
WBC: 6.7 10*3/uL (ref 3.8–10.6)

## 2014-12-27 LAB — MAGNESIUM: MAGNESIUM: 2.2 mg/dL (ref 1.7–2.4)

## 2014-12-27 LAB — BASIC METABOLIC PANEL
Anion gap: 5 (ref 5–15)
BUN: 17 mg/dL (ref 6–20)
CHLORIDE: 106 mmol/L (ref 101–111)
CO2: 24 mmol/L (ref 22–32)
CREATININE: 0.96 mg/dL (ref 0.61–1.24)
Calcium: 8.7 mg/dL — ABNORMAL LOW (ref 8.9–10.3)
GFR calc non Af Amer: >60 mL/min (ref >60)
Glucose, Bld: 194 mg/dL — ABNORMAL HIGH (ref 65–99)
POTASSIUM: 4.5 mmol/L (ref 3.5–5.1)
Sodium: 135 mmol/L (ref 135–145)

## 2014-12-27 LAB — PHOSPHORUS: PHOSPHORUS: 3.5 mg/dL (ref 2.5–4.6)

## 2014-12-27 LAB — PSA: PSA: 3.76 ng/mL (ref 0.00–4.00)

## 2014-12-27 MED ORDER — MORPHINE SULFATE (CONCENTRATE) 20 MG/ML PO SOLN: ORAL | Status: DC

## 2014-12-27 NOTE — Progress Notes (Signed)
Patient is here for follow-up of met. Prostate cancer. Patient states that he saw Dr. Baruch Gouty last Friday and he found another spot on his hip and is planning more radiation. Patient states that he is still having a lot of pain in his hip and leg. Patient rates his pain a 7/10 today and states that sometimes it is a 10/10. He is also having a lot of extreme fatigue and states that some days he sleeps for 18+ hours.

## 2014-12-30 ENCOUNTER — Ambulatory Visit
Admission: RE | Admit: 2014-12-30 | Discharge: 2014-12-30 | Disposition: A | Discharge status: Home / Self Care | Payer: 59 | Source: Ambulatory Visit | Attending: Radiation Oncology | Admitting: Radiation Oncology

## 2014-12-30 DIAGNOSIS — Z510 Encounter for antineoplastic radiation therapy: Secondary | ICD-10-CM | POA: Diagnosis not present

## 2014-12-30 DIAGNOSIS — C7951 Secondary malignant neoplasm of bone: Secondary | ICD-10-CM | POA: Diagnosis present

## 2014-12-30 DIAGNOSIS — C61 Malignant neoplasm of prostate: Secondary | ICD-10-CM | POA: Diagnosis not present

## 2014-12-31 DIAGNOSIS — C7951 Secondary malignant neoplasm of bone: Secondary | ICD-10-CM | POA: Diagnosis not present

## 2015-01-01 ENCOUNTER — Ambulatory Visit: Payer: 59

## 2015-01-01 ENCOUNTER — Other Ambulatory Visit: Payer: Self-pay | Admitting: *Deleted

## 2015-01-01 ENCOUNTER — Inpatient Hospital Stay: Payer: 59

## 2015-01-01 VITALS — BP 109/63 | HR 56 | Temp 96.1°F | Resp 18

## 2015-01-01 DIAGNOSIS — C61 Malignant neoplasm of prostate: Secondary | ICD-10-CM

## 2015-01-01 MED ORDER — DENOSUMAB 120 MG/1.7ML ~~LOC~~ SOLN
120.0000 mg | Freq: Once | SUBCUTANEOUS | Status: AC
  Administered 2015-01-01: 120 mg via SUBCUTANEOUS
  Filled 2015-01-01: qty 1.7

## 2015-01-01 NOTE — Progress Notes (Signed)
   01/01/15 1100  Clinical Encounter Type  Visited With Patient and family together  Visit Type Follow-up  Provided pastoral support, presence and compassion to patient and his sister in the cancer center.  [place] [phone]

## 2015-01-02 ENCOUNTER — Telehealth: Payer: Self-pay | Admitting: *Deleted

## 2015-01-02 ENCOUNTER — Ambulatory Visit
Admission: RE | Admit: 2015-01-02 | Discharge: 2015-01-02 | Disposition: A | Discharge status: Home / Self Care | Payer: 59 | Source: Ambulatory Visit | Attending: Radiation Oncology | Admitting: Radiation Oncology

## 2015-01-02 ENCOUNTER — Ambulatory Visit: Payer: 59

## 2015-01-02 DIAGNOSIS — C7951 Secondary malignant neoplasm of bone: Secondary | ICD-10-CM | POA: Diagnosis not present

## 2015-01-03 ENCOUNTER — Ambulatory Visit: Payer: 59

## 2015-01-03 NOTE — Telephone Encounter (Signed)
Pt brought accommodation form in, rcvd via fas physician statement form and then today pt brought FMLA form.  Called [employer] and was told if pt requesting cont. Leave the accommodation form not needed.  Filled out the other 2 forms and faxed into [employer] today

## 2015-01-06 ENCOUNTER — Ambulatory Visit: Payer: 59

## 2015-01-07 ENCOUNTER — Inpatient Hospital Stay: Payer: 59

## 2015-01-07 ENCOUNTER — Ambulatory Visit: Payer: 59

## 2015-01-08 ENCOUNTER — Ambulatory Visit: Payer: 59

## 2015-01-09 ENCOUNTER — Ambulatory Visit: Payer: 59

## 2015-01-10 ENCOUNTER — Ambulatory Visit: Payer: 59

## 2015-01-10 NOTE — Progress Notes (Signed)
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 Missed follow-up appointment in November. Most recent serum PSA on 05/24/14 increased to 50.1).  06/28/14 - Bone scan. IMPRESSION: 1. Focal uptake within the right acetabular roof corresponds to degenerative change on comparison CT. 2. No evidence of skeletal metastasis. 3. Edema within the soft tissues of lower extremity. 06/28/14 - CT abdomen/pelvis. IMPRESSION: 1. Numerous pelvic and abdominal lymph nodes most of which are stable but some have definitely enlarged. This is worrisome for metastatic disease. 2. No definite sclerotic bone lesions to suggest osseous metastasis. Recommend correlation with bone scan. 3. Stable small hyperdense/hemorrhagic right renal cysts.  Restarted on lupron by Dr.Chrystal on 06/14/14. May 2016 - PSA increased to >100 (was 50 in January). Repeat CT abdomen/pelvis shows stable enlarged lymphadenopathy. Repeat bone scan on 10/10/14 reports bone metastasis.  HISTORY OF PRESENT ILLNESS:  Patient returns for continued oncology followup, he is on palliative treatment with ascites plus prednisone. States that he is tolerating this well without any new side effects. He is status post radiation treatment for palliation, states that pain in the lower back and right adjacent buttock area is well controlled but has noticed some pain in the left hip area and states he is in d/w Dr. Baruch Gouty for  radiation on this side. Denies any muscle weakness in lower extremities, denies any urinary or fecal incontinence or retention. He also continues on Lupron. No dysuria or hematuria. Denies any new cough, chest pain, or hemoptysis. No fevers. Appetite is good. Remains physically active.   REVIEW OF SYSTEMS:   ROS As in HPI above. In addition, no fever, chills. No new headaches or focal weakness.  No new mood disturbances. No  sore throat or dysphagia. Denies new cough, shortness of breath, sputum, hemoptysis or chest pain. No dizziness or palpitation. No abdominal pain, constipation, diarrhea, dysuria or hematuria. No new skin rash or bleeding symptoms. No new paresthesias in extremities. PS ECOG 1.  PAST MEDICAL HISTORY: Past Medical History  Diagnosis Date  . Type II or unspecified type diabetes mellitus without mention of complication, uncontrolled   . Other and unspecified hyperlipidemia   . Essential hypertension, benign   . Myocardial infarction   . Anxiety   . Depression   . Prostate cancer   . 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 Colonoscopy  2013    FAMILY HISTORY Family History  Problem Relation Age of Onset  . Adopted: Yes   ADVANCED DIRECTIVES:   SOCIAL HISTORY: Social History  Substance Use Topics  . Smoking status: Current Every Day Smoker --  1.00 packs/day    Types: Cigarettes  . Smokeless tobacco: Never Used  . Alcohol Use: Yes     Comment: 28 12 ounce beers per week  Chronic smoker, half pack per day for about 30 years. Denies alcohol or recreational drug usage. Physically active and ambulatory.  Allergies  Allergen  Reactions  . Codeine Sulfate Nausea And Vomiting  . Prednisone Nausea And Vomiting    Current Outpatient Prescriptions  Medication Sig Dispense Refill  . albuterol (PROAIR HFA) 108 (90 BASE) MCG/ACT inhaler Inhale into the lungs.    . ALPRAZolam (XANAX) 0.5 MG tablet Take 0.5 mg by mouth at bedtime as needed for sleep.    Marland Kitchen amLODipine (NORVASC) 10 MG tablet Take 10 mg by mouth daily.    Marland Kitchen aspirin 81 MG tablet Take 81 mg by mouth daily.    Marland Kitchen atenolol (TENORMIN) 100 MG tablet Take 100 mg by mouth daily.    Marland Kitchen doxycycline (VIBRA-TABS) 100 MG tablet     . gabapentin (NEURONTIN) 100 MG capsule Take by mouth.    Marland Kitchen ibuprofen (ADVIL,MOTRIN) 200 MG tablet Take 200 mg by mouth every 6 (six) hours as needed for pain.    . isosorbide mononitrate (IMDUR) 30 MG 24 hr tablet Take 30 mg by mouth daily.    Marland Kitchen lisinopril (PRINIVIL,ZESTRIL) 20 MG tablet     . metFORMIN (GLUCOPHAGE) 500 MG tablet Take 500 mg by mouth 2 (two) times daily with a meal.    . morphine (ROXANOL) 20 MG/ML concentrated solution Take 0.5 - 1 mL (10 - 20 mg) once every 2 hours as needed for pain. 30 mL 0  . Multiple Vitamins-Minerals (MEGA MULTI MEN PO) Take by mouth.    . nitroGLYCERIN (NITRODUR - DOSED IN MG/24 HR) 0.4 mg/hr patch Place 1 patch onto the skin daily.    Marland Kitchen Phenyleph-Promethazine-Cod (PROMETHAZINE VC/CODEINE) 5-6.25-10 MG/5ML SYRP 1-2 TSP PO Q 6 HRS PRN    . predniSONE (DELTASONE) 5 MG tablet Take 1 tablet (5 mg total) by mouth 2 (two) times daily with a meal. 60 tablet 6  . sertraline (ZOLOFT) 25 MG tablet Take by mouth.    . simvastatin (ZOCOR) 40 MG tablet Take 40 mg by mouth every evening.    . tamsulosin (FLOMAX) 0.4 MG CAPS capsule Take 1 capsule (0.4 mg total) by mouth daily after supper. 90 capsule 4  . Vitamin D, Ergocalciferol, (DRISDOL) 50000 UNITS CAPS capsule     . ZYTIGA 250 MG tablet      No current facility-administered medications for this visit.    OBJECTIVE: Filed Vitals:   12/27/14 1015  BP:  129/75  Pulse: 53  Temp: 97 F (36.1 C)  Resp: 18     Body mass index is 33.66 kg/(m^2).    ECOG FS:1 - Symptomatic but completely ambulatory  GENERAL: Patient is alert and oriented and in no acute distress. There is no icterus. HEENT: EOMs intact. No cervical lymphadenopathy. CVS: S1S2, regular LUNGS: Bilaterally clear to auscultation, no crepitations  ABDOMEN: Soft, nontender. No hepatomegaly  NEURO: grossly nonfocal, cranial nerves are intact.   EXTREMITIES: No pedal edema.   LAB RESULTS: Creatinine 0.96, calcium 8.7, magnesium 2.2, phosphorus 3.5 Lab Results  Component Value Date   WBC 6.7 12/27/2014   NEUTROABS 5.2 12/27/2014   HGB 13.6 12/27/2014   HCT 39.6* 12/27/2014   MCV 96.0 12/27/2014   PLT 195 12/27/2014   12/27/14 - serum PSA is 3.76 (was >100 in May 2016).   STUDIES:  [facility] - serum PSA in May 2016 is > 100 (was 50.1 on 05/24/14, was 15.3 on 11/23/13).  Sept 2014 - Bone Scan. IMPRESSION:  Areas of increased activity in the lumbar spine and lumbosacral junction as well is in the knees and feet and shoulders. The  findings could represent bony degenerative change rather than metastatic disease.  Sept 2014 - CT scan of the abdomen/pelvis. IMPRESSION:  1. The prostate is borderline enlarged. There is calcification present. There are prominent and enlarged retroperitoneal lymph nodes. No definite bony metastatic lesions are seen but a bone scan is pending. Degenerative changes are seen in the spine.  2. Multiple tiny renal cysts appear to be present.  Oct 2014 - MRI lumbar spine. IMPRESSION:  Multilevel disc degeneration. Small L4-L5 central disc protrusion with minimal deformity thecal sac. L5-S1 small right  paracentral disc protrusion and facet hypertrophy resulting in mild narrowing of the right neural foramen at this level.  06/28/14 - Bone scan. IMPRESSION: 1. Focal uptake within the right acetabular roof corresponds to degenerative change on comparison CT. 2.  No evidence of skeletal metastasis. 3. Edema within the soft tissues of lower extremity.  06/28/14 - CT abdomen/pelvis. IMPRESSION: 1. Numerous pelvic and abdominal lymph nodes most of which are stable but some have definitely enlarged. This is worrisome for metastatic disease. 2. No definite sclerotic bone lesions to suggest osseous metastasis. Recommend correlation with bone scan. 3. Stable small hyperdense/hemorrhagic right renal cysts.  10/10/2014 - Bone Scan. IMPRESSION: 1. New focus of increased uptake in the body of L3, in the posterior superior aspect of the right scapular body, and the left frontal bone. Plain films are recommended. 2. New increased uptake along the anterior aspects of the left sixth, seventh, and tenth ribs and in the anterior aspect of the right seventh rib. The findings may be post traumatic. Is there tenderness over the anterior ribs? Correlation with plain films would be useful.  10/11/2014 - CT scan of abdomen/pelvis. IMPRESSION: The numerous abdominal and upper pelvic lymph nodes are stable in size since 06/28/2014, but remained enlargement comparing back to 02/02/2013. Upper abdominal lymph nodes are borderline to mildly increased for size while retroperitoneal and upper pelvic lymph nodes are normal upper normal in size. 3 months of imaging stability is reassuring, but metastatic disease remains a consideration. Stable appearance of complex cysts in the lower pole the right kidney, likely hemorrhagic or proteinaceous.     ASSESSMENT / PLAN:   1. 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 Missed follow-up appointment in November. Most recent serum PSA on 05/24/14 increased  to 50.1). Bone scan May 2016 showed new bone metastasis. CT scan abdomen/pelvis May 2016 showed abdominal and pelvic lymphadenopathy, progressive, retroperitoneal lymphadenopathy. Started Zytiga + Prednisone early July 2016  -    reviewed labs from today are discussed with patient. Serum PSA has remarkably developed indicative of response to treatment. Patient still has pain issues now in the left hip area, prefers to get palliative radiation but does not want to change pain medication. Otherwise continue current treatment regimen with oral Zytiga + Prednisone. Next Lupron 30 mg IM on 02/07/2015. Next MD follow-up on 03/07/15 with labs and continue treatment. 2. Pain - patient  does not want to try stronger pain medication,  states he has difficulty tolerating most opioids. Continue tramadol when necessary. Plan is as above.   3. Bone metastasis - continue Xgeva 150 mg subcutaneous injection every 4-5 weekly, monitor calcium prior to each dose. 4. In between visits, he was advised to call or come to ER in case of any worsening pain, new symptoms or sickness. He is agreeable to this plan.   Leia Alf, MD   01/10/2015 6:07 AM

## 2015-01-13 ENCOUNTER — Ambulatory Visit: Payer: 59

## 2015-01-14 ENCOUNTER — Ambulatory Visit: Payer: 59

## 2015-01-15 ENCOUNTER — Ambulatory Visit: Payer: 59

## 2015-01-16 ENCOUNTER — Ambulatory Visit: Payer: 59

## 2015-01-17 ENCOUNTER — Ambulatory Visit: Payer: 59

## 2015-01-22 ENCOUNTER — Encounter: Payer: Self-pay | Admitting: *Deleted

## 2015-01-23 ENCOUNTER — Encounter
Admission: RE | Disposition: A | Discharge status: Home / Self Care | Payer: Self-pay | Source: Ambulatory Visit | Attending: Gastroenterology

## 2015-01-23 ENCOUNTER — Ambulatory Visit: Payer: 59 | Admitting: Anesthesiology

## 2015-01-23 ENCOUNTER — Ambulatory Visit
Admission: RE | Admit: 2015-01-23 | Discharge: 2015-01-23 | Disposition: A | Discharge status: Home / Self Care | Payer: 59 | Source: Ambulatory Visit | Attending: Gastroenterology | Admitting: Gastroenterology

## 2015-01-23 DIAGNOSIS — Z8546 Personal history of malignant neoplasm of prostate: Secondary | ICD-10-CM | POA: Diagnosis not present

## 2015-01-23 DIAGNOSIS — I10 Essential (primary) hypertension: Secondary | ICD-10-CM | POA: Insufficient documentation

## 2015-01-23 DIAGNOSIS — K5521 Angiodysplasia of colon with hemorrhage: Secondary | ICD-10-CM | POA: Insufficient documentation

## 2015-01-23 DIAGNOSIS — K648 Other hemorrhoids: Secondary | ICD-10-CM | POA: Insufficient documentation

## 2015-01-23 DIAGNOSIS — I252 Old myocardial infarction: Secondary | ICD-10-CM | POA: Insufficient documentation

## 2015-01-23 DIAGNOSIS — F419 Anxiety disorder, unspecified: Secondary | ICD-10-CM | POA: Diagnosis not present

## 2015-01-23 DIAGNOSIS — D120 Benign neoplasm of cecum: Secondary | ICD-10-CM | POA: Insufficient documentation

## 2015-01-23 DIAGNOSIS — F329 Major depressive disorder, single episode, unspecified: Secondary | ICD-10-CM | POA: Insufficient documentation

## 2015-01-23 DIAGNOSIS — D122 Benign neoplasm of ascending colon: Secondary | ICD-10-CM | POA: Insufficient documentation

## 2015-01-23 DIAGNOSIS — K6289 Other specified diseases of anus and rectum: Secondary | ICD-10-CM | POA: Diagnosis not present

## 2015-01-23 DIAGNOSIS — K625 Hemorrhage of anus and rectum: Secondary | ICD-10-CM | POA: Diagnosis present

## 2015-01-23 DIAGNOSIS — E119 Type 2 diabetes mellitus without complications: Secondary | ICD-10-CM | POA: Insufficient documentation

## 2015-01-23 DIAGNOSIS — E785 Hyperlipidemia, unspecified: Secondary | ICD-10-CM | POA: Insufficient documentation

## 2015-01-23 HISTORY — DX: Chronic kidney disease, unspecified: N18.9

## 2015-01-23 HISTORY — DX: Asymptomatic varicose veins of unspecified lower extremity: I83.90

## 2015-01-23 HISTORY — DX: Obesity, unspecified: E66.9

## 2015-01-23 HISTORY — PX: COLONOSCOPY WITH PROPOFOL: SHX5780

## 2015-01-23 LAB — GLUCOSE, CAPILLARY: GLUCOSE-CAPILLARY: 162 mg/dL — AB (ref 65–99)

## 2015-01-23 SURGERY — COLONOSCOPY WITH PROPOFOL
Anesthesia: General

## 2015-01-23 MED ORDER — SODIUM CHLORIDE 0.9 % IV SOLN
INTRAVENOUS | Status: DC
  Administered 2015-01-23: 1000 mL via INTRAVENOUS

## 2015-01-23 MED ORDER — PROPOFOL 10 MG/ML IV BOLUS
INTRAVENOUS | Status: DC | PRN
  Administered 2015-01-23: 120 mg via INTRAVENOUS
  Administered 2015-01-23: 30 mg via INTRAVENOUS
  Administered 2015-01-23: 70 mg via INTRAVENOUS
  Administered 2015-01-23: 40 mg via INTRAVENOUS

## 2015-01-23 MED ORDER — PROPOFOL INFUSION 10 MG/ML OPTIME
INTRAVENOUS | Status: DC | PRN
  Administered 2015-01-23: 100 ug/kg/min via INTRAVENOUS

## 2015-01-23 MED ORDER — SODIUM CHLORIDE 0.9 % IV SOLN: INTRAVENOUS | Status: DC

## 2015-01-23 MED ORDER — LIDOCAINE HCL (CARDIAC) 20 MG/ML IV SOLN
INTRAVENOUS | Status: DC | PRN
  Administered 2015-01-23: 20 mg via INTRAVENOUS

## 2015-01-23 NOTE — H&P (Signed)
  Date of Initial H&P: 01/15/2015  History reviewed, patient examined, no change in status, stable for surgery.

## 2015-01-23 NOTE — Op Note (Signed)
[facility]astroenterology Patient Name: Sean Petersen Procedure Date: 01/23/2015 8:41 AM MRN: [id number] Account #: [id number] Date of Birth: 1956-05-09 Admit Type: Outpatient Age: 59 Room: [facility] ENDO ROOM 4 Gender: Male Note Status: Finalized Procedure:         Colonoscopy Indications:       Rectal bleeding Providers:         Lupita Dawn. Candace Cruise, MD Referring MD:      Tracie Harrier, MD (Referring MD) Medicines:         Monitored Anesthesia Care Complications:     No immediate complications. Procedure:         Pre-Anesthesia Assessment:                    - Prior to the procedure, a History and Physical was                     performed, and patient medications, allergies and                     sensitivities were reviewed. The patient's tolerance of                     previous anesthesia was reviewed.                    - The risks and benefits of the procedure and the sedation                     options and risks were discussed with the patient. All                     questions were answered and informed consent was obtained.                    - After reviewing the risks and benefits, the patient was                     deemed in satisfactory condition to undergo the procedure.                    After obtaining informed consent, the colonoscope was                     passed under direct vision. Throughout the procedure, the                     patient's blood pressure, pulse, and oxygen saturations                     were monitored continuously. The Colonoscope was                     introduced through the anus and advanced to the the cecum,                     identified by appendiceal orifice and ileocecal valve. The                     colonoscopy was performed without difficulty. The patient                     tolerated the procedure well. The quality of the bowel  preparation was good. Findings:      Two sessile polyps were found in  the ascending colon and in the cecum.       The polyps were diminutive in size. These polyps were removed with a hot       snare. Resection and retrieval were complete.      Multiple small localized angiodysplastic lesions were found in the       rectum. Vaporization for hemostasis using argon plasma was successful.       Possibly due to radiation.      The exam was otherwise without abnormality.      The perianal exam findings include non-thrombosed internal hemorrhoids. Impression:        - Two diminutive polyps in the ascending colon and in the                     cecum. Resected and retrieved.                    - Multiple colonic angiodysplastic lesions. Treated with                     argon plasma coagulation (APC).                    - The examination was otherwise normal.                    - Non-thrombosed internal hemorrhoids found on perianal                     exam. Recommendation:    - Discharge patient to home.                    - Await pathology results.                    - Repeat colonoscopy in 5 years for surveillance based on                     pathology results.                    - The findings and recommendations were discussed with the                     patient.                    - Treat internal hemorrhoids. If no relief with                     suppositories, consider hemorrhoidal banding. Procedure Code(s): --- Professional ---                    [phone], 59, Colonoscopy, flexible; with control of                     bleeding, any method                    45385, Colonoscopy, flexible; with removal of tumor(s),                     polyp(s), or other lesion(s) by snare technique Diagnosis Code(s): --- Professional ---                    D12.2, Benign neoplasm of  ascending colon                    D12.0, Benign neoplasm of cecum                    K55.20, Angiodysplasia of colon without hemorrhage                    K64.8, Other hemorrhoids                     K62.5, Hemorrhage of anus and rectum CPT copyright 2014 American Medical Association. All rights reserved. The codes documented in this report are preliminary and upon coder review may  be revised to meet current compliance requirements. Hulen Luster, MD 01/23/2015 9:20:40 AM This report has been signed electronically. Number of Addenda: 0 Note Initiated On: 01/23/2015 8:41 AM Scope Withdrawal Time: 0 hours 15 minutes 38 seconds  Total Procedure Duration: 0 hours 18 minutes 41 seconds       [facility]

## 2015-01-23 NOTE — Anesthesia Preprocedure Evaluation (Signed)
Anesthesia Evaluation  Patient identified by MRN, date of birth, ID band Patient awake    Reviewed: Allergy & Precautions, H&P , NPO status , Patient's Chart, lab work & pertinent test results, reviewed documented beta blocker date and time   History of Anesthesia Complications (+) AWARENESS UNDER ANESTHESIA and history of anesthetic complications  Airway Mallampati: III  TM Distance: >3 FB Neck ROM: full    Dental  (+) Poor Dentition   Pulmonary neg shortness of breath, neg sleep apnea, neg COPD, neg recent URI, Current Smoker,    Pulmonary exam normal breath sounds clear to auscultation       Cardiovascular Exercise Tolerance: Good hypertension, (-) angina+ CAD and + Past MI  (-) Cardiac Stents and (-) CABG Normal cardiovascular exam(-) dysrhythmias (-) Valvular Problems/Murmurs Rhythm:regular Rate:Normal     Neuro/Psych PSYCHIATRIC DISORDERS (depression and anxiety) negative neurological ROS     GI/Hepatic negative GI ROS, Neg liver ROS,   Endo/Other  diabetes  Renal/GU CRFRenal disease  negative genitourinary   Musculoskeletal   Abdominal   Peds  Hematology negative hematology ROS (+)   Anesthesia Other Findings Past Medical History:   Type II or unspecified type diabetes mellitus *              Other and unspecified hyperlipidemia                         Essential hypertension, benign                               Myocardial infarction                                        Anxiety                                                      Depression                                                   Prostate cancer                                              Cancer                                                         Comment:states he had cancer in his left forearm but is              unaware of type and was told his body took care              of it   Obesity  Varicosities                                                 Chronic kidney disease                                         Comment:stage 3   Reproductive/Obstetrics negative OB ROS                             Anesthesia Physical Anesthesia Plan  ASA: III  Anesthesia Plan: General   Post-op Pain Management:    Induction:   Airway Management Planned:   Additional Equipment:   Intra-op Plan:   Post-operative Plan:   Informed Consent: I have reviewed the patients History and Physical, chart, labs and discussed the procedure including the risks, benefits and alternatives for the proposed anesthesia with the patient or authorized representative who has indicated his/her understanding and acceptance.   Dental Advisory Given  Plan Discussed with: Anesthesiologist, CRNA and Surgeon  Anesthesia Plan Comments:         Anesthesia Quick Evaluation

## 2015-01-23 NOTE — Transfer of Care (Signed)
Immediate Anesthesia Transfer of Care Note  Patient: Sean Petersen  Procedure(s) Performed: Procedure(s): COLONOSCOPY WITH PROPOFOL (N/A)  Patient Location: Endoscopy Unit  Anesthesia Type:General  Level of Consciousness: awake and oriented  Airway & Oxygen Therapy: Patient Spontanous Breathing and Patient connected to nasal cannula oxygen  Post-op Assessment: Report given to RN and Post -op Vital signs reviewed and stable  Post vital signs: Reviewed and stable  Last Vitals:  Filed Vitals:   01/23/15 0923  BP: 103/53  Pulse: 61  Temp: 35.9 C  Resp: 15    Complications: No apparent anesthesia complications

## 2015-01-24 ENCOUNTER — Encounter: Payer: Self-pay | Admitting: Gastroenterology

## 2015-01-24 LAB — SURGICAL PATHOLOGY

## 2015-01-24 NOTE — Progress Notes (Signed)
01/24/15 @09 :55am Entered encounter erroneously. KSS

## 2015-01-24 NOTE — Anesthesia Postprocedure Evaluation (Signed)
  Anesthesia Post-op Note  Patient: Sean Petersen  Procedure(s) Performed: Procedure(s): COLONOSCOPY WITH PROPOFOL (N/A)  Anesthesia type:General  Patient location: PACU  Post pain: Pain level controlled  Post assessment: Post-op Vital signs reviewed, Patient's Cardiovascular Status Stable, Respiratory Function Stable, Patent Airway and No signs of Nausea or vomiting  Post vital signs: Reviewed and stable  Last Vitals:  Filed Vitals:   01/23/15 0953  BP: 138/74  Pulse: 64  Temp:   Resp: 19    Level of consciousness: awake, alert  and patient cooperative  Complications: No apparent anesthesia complications

## 2015-01-27 ENCOUNTER — Other Ambulatory Visit: Payer: Self-pay | Admitting: Internal Medicine

## 2015-01-27 ENCOUNTER — Telehealth: Payer: Self-pay | Admitting: *Deleted

## 2015-01-27 DIAGNOSIS — C61 Malignant neoplasm of prostate: Secondary | ICD-10-CM

## 2015-01-27 MED ORDER — MORPHINE SULFATE (CONCENTRATE) 20 MG/ML PO SOLN: ORAL | Status: DC

## 2015-01-27 NOTE — Telephone Encounter (Signed)
Pt called to say that his leg hurts the same as it did before he started radiation but when he met with chrystal that it should start working soon.  He needs refill of his morphine liq.  Also he sent us the form for accommodation at work place and we called [employer] and spoke to representative and they said it was not needed at this time because he is on continious leave at present time. pand[facility] will get rx ready for pt to pick up and he will come Monday. 

## 2015-02-07 ENCOUNTER — Encounter: Payer: Self-pay | Admitting: Radiation Oncology

## 2015-02-07 ENCOUNTER — Ambulatory Visit
Admission: RE | Admit: 2015-02-07 | Discharge: 2015-02-07 | Disposition: A | Discharge status: Home / Self Care | Payer: 59 | Source: Ambulatory Visit | Attending: Radiation Oncology | Admitting: Radiation Oncology

## 2015-02-07 ENCOUNTER — Other Ambulatory Visit: Payer: Self-pay | Admitting: *Deleted

## 2015-02-07 VITALS — BP 145/79 | HR 61 | Temp 97.7°F | Resp 18 | Wt 212.2 lb

## 2015-02-07 DIAGNOSIS — C7951 Secondary malignant neoplasm of bone: Secondary | ICD-10-CM

## 2015-02-07 DIAGNOSIS — C61 Malignant neoplasm of prostate: Secondary | ICD-10-CM

## 2015-02-07 DIAGNOSIS — M25551 Pain in right hip: Secondary | ICD-10-CM | POA: Insufficient documentation

## 2015-02-07 NOTE — Progress Notes (Signed)
Radiation Oncology Follow up Note  Name: Sean Petersen   Date:   02/07/2015 MRN:  [id number] DOB: 1955-11-21    This 59 y.o. male presents to the clinic today for follow-up stage IV prostate cancer.  REFERRING PROVIDER: Tracie Harrier, MD  HPI: Patient is a 59 year old male originally treated back in September 2014 for a Gleason 9 (4+5) adenocarcinoma the prostate although underwent biochemical failure now has bone metastasis. We treated his lumbar spine and is now 1 month out for palliative radiation therapy to his right hip. Currently on Zytiga and prednisone under medical oncology's care. He is seen today complaining of persistent narcotic dependent pain in his right hip he states it may even be worse and was originally. His lower back is improved..  COMPLICATIONS OF TREATMENT: none  FOLLOW UP COMPLIANCE: keeps appointments   PHYSICAL EXAM:  BP 145/79 mmHg  Pulse 61  Temp(Src) 97.7 F (36.5 C)  Resp 18  Wt 212 lb 3.1 oz (96.25 kg) Range of motion of his right lower extremity doesn't elicit significant pain. Motor sensory and DTR levels are equal and symmetric in lower extremities. Deep palpation of his lumbar spine does not elicit pain. Well-developed well-nourished patient in NAD. HEENT reveals PERLA, EOMI, discs not visualized.  Oral cavity is clear. No oral mucosal lesions are identified. Neck is clear without evidence of cervical or supraclavicular adenopathy. Lungs are clear to A&P. Cardiac examination is essentially unremarkable with regular rate and rhythm without murmur rub or thrill. Abdomen is benign with no organomegaly or masses noted. Motor sensory and DTR levels are equal and symmetric in the upper and lower extremities. Cranial nerves II through XII are grossly intact. Proprioception is intact. No peripheral adenopathy or edema is identified. No motor or sensory levels are noted. Crude visual fields are within normal range.   RADIOLOGY RESULTS: Prior bone scan is  reviewed I have ordered plain films of his right hip  PLAN: I've ordered plain films of his right hip or evaluate them and discussed the case with Dr. Lucio Edward.. Further recommendations may include Xofigo treatment. He continues close follow-up care of medical oncology. Will make further recommendations after reviewing his plain films and discussing the case with medical oncology.  I would like to take this opportunity for allowing me to participate in the care of your patient.Armstead Peaks., MD

## 2015-02-24 ENCOUNTER — Other Ambulatory Visit: Payer: Self-pay | Admitting: Internal Medicine

## 2015-02-24 ENCOUNTER — Telehealth: Payer: Self-pay | Admitting: *Deleted

## 2015-02-24 DIAGNOSIS — C61 Malignant neoplasm of prostate: Secondary | ICD-10-CM

## 2015-02-24 MED ORDER — MORPHINE SULFATE (CONCENTRATE) 20 MG/ML PO SOLN: ORAL | Status: DC

## 2015-02-24 NOTE — Telephone Encounter (Signed)
Informed that prescription is ready to pick up  

## 2015-02-24 NOTE — Telephone Encounter (Signed)
Legs are hurting and cannot hardly stand because of pain and is asking for refill on [facility]

## 2015-03-07 ENCOUNTER — Inpatient Hospital Stay: Payer: 59 | Attending: Internal Medicine | Admitting: Internal Medicine

## 2015-03-07 ENCOUNTER — Inpatient Hospital Stay: Payer: 59

## 2015-03-07 ENCOUNTER — Ambulatory Visit: Admission: RE | Admit: 2015-03-07 | Payer: 59 | Source: Ambulatory Visit | Admitting: Gastroenterology

## 2015-03-07 ENCOUNTER — Encounter: Admission: RE | Payer: Self-pay | Source: Ambulatory Visit

## 2015-03-07 VITALS — BP 157/87 | HR 59 | Temp 97.7°F | Wt 209.7 lb

## 2015-03-07 DIAGNOSIS — F17210 Nicotine dependence, cigarettes, uncomplicated: Secondary | ICD-10-CM | POA: Diagnosis not present

## 2015-03-07 DIAGNOSIS — Z7984 Long term (current) use of oral hypoglycemic drugs: Secondary | ICD-10-CM | POA: Diagnosis not present

## 2015-03-07 DIAGNOSIS — C7951 Secondary malignant neoplasm of bone: Secondary | ICD-10-CM | POA: Diagnosis not present

## 2015-03-07 DIAGNOSIS — Z79899 Other long term (current) drug therapy: Secondary | ICD-10-CM | POA: Diagnosis not present

## 2015-03-07 DIAGNOSIS — Z7952 Long term (current) use of systemic steroids: Secondary | ICD-10-CM | POA: Insufficient documentation

## 2015-03-07 DIAGNOSIS — I129 Hypertensive chronic kidney disease with stage 1 through stage 4 chronic kidney disease, or unspecified chronic kidney disease: Secondary | ICD-10-CM

## 2015-03-07 DIAGNOSIS — M25561 Pain in right knee: Secondary | ICD-10-CM | POA: Diagnosis not present

## 2015-03-07 DIAGNOSIS — C771 Secondary and unspecified malignant neoplasm of intrathoracic lymph nodes: Secondary | ICD-10-CM

## 2015-03-07 DIAGNOSIS — E119 Type 2 diabetes mellitus without complications: Secondary | ICD-10-CM | POA: Diagnosis not present

## 2015-03-07 DIAGNOSIS — N183 Chronic kidney disease, stage 3 (moderate): Secondary | ICD-10-CM | POA: Diagnosis not present

## 2015-03-07 DIAGNOSIS — I252 Old myocardial infarction: Secondary | ICD-10-CM | POA: Diagnosis not present

## 2015-03-07 DIAGNOSIS — M25551 Pain in right hip: Secondary | ICD-10-CM | POA: Diagnosis not present

## 2015-03-07 DIAGNOSIS — E785 Hyperlipidemia, unspecified: Secondary | ICD-10-CM

## 2015-03-07 DIAGNOSIS — F419 Anxiety disorder, unspecified: Secondary | ICD-10-CM | POA: Insufficient documentation

## 2015-03-07 DIAGNOSIS — R9721 Rising PSA following treatment for malignant neoplasm of prostate: Secondary | ICD-10-CM | POA: Insufficient documentation

## 2015-03-07 DIAGNOSIS — Z79818 Long term (current) use of other agents affecting estrogen receptors and estrogen levels: Secondary | ICD-10-CM | POA: Diagnosis not present

## 2015-03-07 DIAGNOSIS — E669 Obesity, unspecified: Secondary | ICD-10-CM | POA: Insufficient documentation

## 2015-03-07 DIAGNOSIS — F329 Major depressive disorder, single episode, unspecified: Secondary | ICD-10-CM | POA: Insufficient documentation

## 2015-03-07 DIAGNOSIS — I10 Essential (primary) hypertension: Secondary | ICD-10-CM

## 2015-03-07 DIAGNOSIS — C61 Malignant neoplasm of prostate: Secondary | ICD-10-CM

## 2015-03-07 LAB — CBC WITH DIFFERENTIAL/PLATELET
BASOS ABS: 0 10*3/uL (ref 0–0.1)
BASOS PCT: 0 %
EOS ABS: 0.1 10*3/uL (ref 0–0.7)
EOS PCT: 2 %
HCT: 42.8 % (ref 40.0–52.0)
HEMOGLOBIN: 14.6 g/dL (ref 13.0–18.0)
LYMPHS ABS: 0.4 10*3/uL — AB (ref 1.0–3.6)
LYMPHS PCT: 5 %
MCH: 32.9 pg (ref 26.0–34.0)
MCHC: 34.1 g/dL (ref 32.0–36.0)
MCV: 96.5 fL (ref 80.0–100.0)
Monocytes Absolute: 0.7 10*3/uL (ref 0.2–1.0)
Monocytes Relative: 8 %
Neutro Abs: 6.6 10*3/uL — ABNORMAL HIGH (ref 1.4–6.5)
Neutrophils Relative %: 85 %
PLATELETS: 217 10*3/uL (ref 150–440)
RBC: 4.43 MIL/uL (ref 4.40–5.90)
RDW: 13.9 % (ref 11.5–14.5)
WBC: 7.9 10*3/uL (ref 3.8–10.6)

## 2015-03-07 LAB — HEPATIC FUNCTION PANEL
ALBUMIN: 4 g/dL (ref 3.5–5.0)
ALK PHOS: 67 U/L (ref 38–126)
ALT: 17 U/L (ref 17–63)
AST: 26 U/L (ref 15–41)
Bilirubin, Direct: <0.1 mg/dL — ABNORMAL LOW (ref 0.1–0.5)
TOTAL PROTEIN: 6.8 g/dL (ref 6.5–8.1)
Total Bilirubin: 0.7 mg/dL (ref 0.3–1.2)

## 2015-03-07 LAB — PSA: PSA: 1.01 ng/mL (ref 0.00–4.00)

## 2015-03-07 LAB — BASIC METABOLIC PANEL
ANION GAP: 7 (ref 5–15)
BUN: 11 mg/dL (ref 6–20)
CHLORIDE: 105 mmol/L (ref 101–111)
CO2: 25 mmol/L (ref 22–32)
Calcium: 8.8 mg/dL — ABNORMAL LOW (ref 8.9–10.3)
Creatinine, Ser: 1.04 mg/dL (ref 0.61–1.24)
Glucose, Bld: 232 mg/dL — ABNORMAL HIGH (ref 65–99)
POTASSIUM: 4.8 mmol/L (ref 3.5–5.1)
SODIUM: 137 mmol/L (ref 135–145)

## 2015-03-07 SURGERY — COLONOSCOPY WITH PROPOFOL
Anesthesia: General

## 2015-03-07 MED ORDER — MORPHINE SULFATE (CONCENTRATE) 20 MG/ML PO SOLN: ORAL | Status: DC

## 2015-03-07 NOTE — Progress Notes (Signed)
[place] OFFICE PROGRESS NOTE  Patient Care Team: Tracie Harrier, MD as PCP - General (Internal Medicine)   SUMMARY OF ONCOLOGIC HISTORY:  # 2014- PROSTATE CA [s/p Bx-Gleason score 5+4; s/p EBRT [Dr.Crystal];July 2015- Biochem Relapse [PSA-15]; non-compliance with Lupron; JAN 2016-PSA 50   # MAY 2016- PSA-100/Bone scan- Bone mets  [s/p EBRT Right Hip]; CT A/P- PELVIC/RP LN;   # July 2016- ? CASTRATE RESISTANT START ZYTIGA + Prednisone; AUG PSA 3.2    INTERVAL HISTORY:  59 year old male patient with above history of metastatic prostate cancer question castrate resistant currently on first line therapy with Zytiga plus prednisone; Lupron every 4 months; X-geva is for follow-up.  REVIEW OF SYSTEMS:  A complete 10 point review of system is done which is negative except mentioned above/history of present illness.   PAST MEDICAL HISTORY :  Past Medical History  Diagnosis Date  . Type II or unspecified type diabetes mellitus without mention of complication, uncontrolled   . Other and unspecified hyperlipidemia   . Essential hypertension, benign   . Myocardial infarction   . Anxiety   . Depression   . Prostate cancer   . Cancer     states he had cancer in his left forearm but is unaware of type and was told his body took care of it  . Obesity   . Varicosities   . Chronic kidney disease     stage 3    PAST SURGICAL HISTORY :   Past Surgical History  Procedure Laterality Date  . Colonoscopy  2013  . Intraoperative arteriogram      for vascular disease  . Colonoscopy with propofol N/A 01/23/2015    Procedure: COLONOSCOPY WITH PROPOFOL;  Surgeon: Hulen Luster, MD;  Location: [facility] ENDOSCOPY;  Service: Gastroenterology;  Laterality: N/A;    FAMILY HISTORY :   Family History  Problem Relation Age of Onset  . Adopted: Yes    SOCIAL HISTORY:   Social History  Substance Use Topics  . Smoking status: Current Every Day Smoker -- 1.00 packs/day    Types:  Cigarettes  . Smokeless tobacco: Never Used  . Alcohol Use: Yes     Comment: 28 12 ounce beers per week    ALLERGIES:  is allergic to codeine sulfate and prednisone.  MEDICATIONS:  Current Outpatient Prescriptions  Medication Sig Dispense Refill  . ALPRAZolam (XANAX) 0.5 MG tablet Take 0.5 mg by mouth at bedtime as needed for sleep.    Marland Kitchen amLODipine (NORVASC) 10 MG tablet Take 10 mg by mouth daily.    . ANUCORT-HC 25 MG suppository     . atenolol (TENORMIN) 100 MG tablet Take 100 mg by mouth daily.    Marland Kitchen gabapentin (NEURONTIN) 100 MG capsule Take by mouth.    . isosorbide mononitrate (IMDUR) 30 MG 24 hr tablet Take 30 mg by mouth daily.    Marland Kitchen lisinopril (PRINIVIL,ZESTRIL) 20 MG tablet     . metFORMIN (GLUCOPHAGE) 500 MG tablet Take 500 mg by mouth 2 (two) times daily with a meal.    . morphine (ROXANOL) 20 MG/ML concentrated solution Take 0.5 - 1 mL (10 - 20 mg) once every 2 hours as needed for pain. 30 mL 0  . Multiple Vitamins-Minerals (MEGA MULTI MEN PO) Take by mouth.    . predniSONE (DELTASONE) 5 MG tablet Take 1 tablet (5 mg total) by mouth 2 (two) times daily with a meal. 60 tablet 6  . sertraline (ZOLOFT) 25 MG tablet Take by  mouth.    . simvastatin (ZOCOR) 40 MG tablet Take 40 mg by mouth every evening.    . tamsulosin (FLOMAX) 0.4 MG CAPS capsule Take 1 capsule (0.4 mg total) by mouth daily after supper. 90 capsule 4  . Vitamin D, Ergocalciferol, (DRISDOL) 50000 UNITS CAPS capsule     . ZYTIGA 250 MG tablet      No current facility-administered medications for this visit.    PHYSICAL EXAMINATION: ECOG PERFORMANCE STATUS: 2 - Symptomatic, <50% confined to bed  BP 157/87 mmHg  Pulse 59  Temp(Src) 97.7 F (36.5 C) (Tympanic)  Wt 209 lb 10.5 oz (95.1 kg)  Filed Weights   03/07/15 1424  Weight: 209 lb 10.5 oz (95.1 kg)    GENERAL: Well-nourished well-developed; Alert, no distress and comfortable.  Cannot sit on the exam table because of pain. Obese.  EYES: no pallor or  icterus OROPHARYNX: no thrush or ulceration; good dentition  NECK: supple, no masses felt LYMPH:  no palpable lymphadenopathy in the cervical, axillary or inguinal regions LUNGS: clear to auscultation and  No wheeze or crackles HEART/CVS: regular rate & rhythm and no murmurs; No lower extremity edema ABDOMEN:abdomen soft, non-tender and normal bowel sounds Musculoskeletal:no cyanosis of digits and no clubbing  PSYCH: alert & oriented x 3 with fluent speech NEURO: no focal motor/sensory deficits SKIN:  no rashes or significant lesions  LABORATORY DATA:  I have reviewed the data as listed    Component Value Date/Time   NA 135 12/27/2014 0928   K 4.5 12/27/2014 0928   CL 106 12/27/2014 0928   CO2 24 12/27/2014 0928   GLUCOSE 194* 12/27/2014 0928   BUN 17 12/27/2014 0928   CREATININE 0.96 12/27/2014 0928   CALCIUM 8.7* 12/27/2014 0928   GFRNONAA >60 12/27/2014 0928   GFRAA >60 12/27/2014 0928    No results found for: SPEP, UPEP  Lab Results  Component Value Date   WBC 7.9 03/07/2015   NEUTROABS 6.6* 03/07/2015   HGB 14.6 03/07/2015   HCT 42.8 03/07/2015   MCV 96.5 03/07/2015   PLT 217 03/07/2015      Chemistry      Component Value Date/Time   NA 135 12/27/2014 0928   K 4.5 12/27/2014 0928   CL 106 12/27/2014 0928   CO2 24 12/27/2014 0928   BUN 17 12/27/2014 0928   CREATININE 0.96 12/27/2014 0928      Component Value Date/Time   CALCIUM 8.7* 12/27/2014 9507       RADIOGRAPHIC STUDIES: I have personally reviewed the radiological images as listed and agreed with the findings in the report. No results found.   ASSESSMENT & PLAN:   # METASTATIC CASTRATE RESISTANT PROSTATE CANCER currently on first line therapy with Zytiga plus prednisone since July 2016/Lupron every 4 months. Most recent PSA August 2016 3.4.  Patient seems to be tolerating Zytiga plus prednisone very well. Continue the same. Awaiting on PSA today. Given the ongoing pain and would recommend  repeat evaluation with a bone scan and also CT of the abdomen and pelvis. Also check testosterone.   # Chronic pain on narcotics- new prescription given.  # Bone metatstasis- on X-geva q 8 weeks. No concerns for osteonecrosis of the jaw.  # RIGHT HIP Pain/R LE- question osteoarthritis versus metastasis from prostate cancer. Clinically this is more consistent with osteoarthritis rather than metastatic prostate cancer. Recommend evaluation with orthopedics.  Recommend the scans to be done as soon as possible; follow-up with me few days after/  and also get x-geva & Lupron.   Orders Placed This Encounter  Procedures  . CT Abdomen Pelvis W Contrast    Standing Status: Future     Number of Occurrences:      Standing Expiration Date: 03/06/2016    Order Specific Question:  Reason for Exam (SYMPTOM  OR DIAGNOSIS REQUIRED)    Answer:  Metastatic prostate cancer    Order Specific Question:  Preferred imaging location?    Answer:  [facility]  . NM Bone Scan Whole Body    Standing Status: Future     Number of Occurrences:      Standing Expiration Date: 03/06/2016    Order Specific Question:  Reason for Exam (SYMPTOM  OR DIAGNOSIS REQUIRED)    Answer:  Metastatic prostate cancer    Order Specific Question:  Preferred imaging location?    Answer:  [facility]  . Testosterone    Standing Status: Future     Number of Occurrences:      Standing Expiration Date: 04/10/2016   All questions were answered. The patient knows to call the clinic with any problems, questions or concerns. No barriers to learning was detected. I spent 25 minutes counseling the patient face to face. The total time spent in the appointment was 30 minutes and more than 50% was on counseling and review of test results     Cammie Sickle, MD 03/07/2015 3:11 PM

## 2015-03-07 NOTE — Progress Notes (Signed)
Patient states he is having pain in his right hip, femur and knee.  Patient states he has had radiation to this area and a x-Alcaraz about a month ago.  States he has never been told what the x-Gatti revealed.  Also brought his FMLA paperwork today.  States it needs to be faxed before 6 pm Tuesday evening 03-11-15.  Patient states he feels like no one is telling him what he is dealing with.  Very tearful today.  Patient has not had his flu shot this season.  Patient also requesting refill for Roxanol.

## 2015-03-12 ENCOUNTER — Encounter
Admission: RE | Admit: 2015-03-12 | Discharge: 2015-03-12 | Disposition: A | Discharge status: Home / Self Care | Payer: 59 | Source: Ambulatory Visit | Attending: Internal Medicine | Admitting: Internal Medicine

## 2015-03-12 DIAGNOSIS — C61 Malignant neoplasm of prostate: Secondary | ICD-10-CM | POA: Diagnosis not present

## 2015-03-12 DIAGNOSIS — C771 Secondary and unspecified malignant neoplasm of intrathoracic lymph nodes: Secondary | ICD-10-CM | POA: Insufficient documentation

## 2015-03-12 MED ORDER — TECHNETIUM TC 99M MEDRONATE IV KIT: 23.0890 | PACK | Freq: Once | INTRAVENOUS | Status: DC | PRN

## 2015-03-12 MED ORDER — TECHNETIUM TC 99M MEDRONATE IV KIT
23.0890 | PACK | Freq: Once | INTRAVENOUS | Status: AC | PRN
  Administered 2015-03-12: 23.089 via INTRAVENOUS

## 2015-03-13 ENCOUNTER — Ambulatory Visit
Admission: RE | Admit: 2015-03-13 | Discharge: 2015-03-13 | Disposition: A | Discharge status: Home / Self Care | Payer: 59 | Source: Ambulatory Visit | Attending: Internal Medicine | Admitting: Internal Medicine

## 2015-03-13 DIAGNOSIS — C7951 Secondary malignant neoplasm of bone: Secondary | ICD-10-CM | POA: Insufficient documentation

## 2015-03-13 DIAGNOSIS — C771 Secondary and unspecified malignant neoplasm of intrathoracic lymph nodes: Secondary | ICD-10-CM | POA: Insufficient documentation

## 2015-03-13 DIAGNOSIS — C61 Malignant neoplasm of prostate: Secondary | ICD-10-CM | POA: Insufficient documentation

## 2015-03-13 MED ORDER — IOHEXOL 300 MG/ML  SOLN
100.0000 mL | Freq: Once | INTRAMUSCULAR | Status: AC | PRN
  Administered 2015-03-13: 100 mL via INTRAVENOUS

## 2015-03-14 ENCOUNTER — Inpatient Hospital Stay: Payer: 59

## 2015-03-14 ENCOUNTER — Inpatient Hospital Stay (HOSPITAL_BASED_OUTPATIENT_CLINIC_OR_DEPARTMENT_OTHER): Payer: 59 | Admitting: Internal Medicine

## 2015-03-14 VITALS — BP 135/76 | HR 53 | Temp 96.9°F | Wt 208.3 lb

## 2015-03-14 DIAGNOSIS — C7951 Secondary malignant neoplasm of bone: Secondary | ICD-10-CM

## 2015-03-14 DIAGNOSIS — C61 Malignant neoplasm of prostate: Secondary | ICD-10-CM

## 2015-03-14 DIAGNOSIS — F17210 Nicotine dependence, cigarettes, uncomplicated: Secondary | ICD-10-CM

## 2015-03-14 DIAGNOSIS — E785 Hyperlipidemia, unspecified: Secondary | ICD-10-CM

## 2015-03-14 DIAGNOSIS — Z7952 Long term (current) use of systemic steroids: Secondary | ICD-10-CM

## 2015-03-14 DIAGNOSIS — M25561 Pain in right knee: Secondary | ICD-10-CM

## 2015-03-14 DIAGNOSIS — R9721 Rising PSA following treatment for malignant neoplasm of prostate: Secondary | ICD-10-CM

## 2015-03-14 DIAGNOSIS — C771 Secondary and unspecified malignant neoplasm of intrathoracic lymph nodes: Principal | ICD-10-CM

## 2015-03-14 DIAGNOSIS — I252 Old myocardial infarction: Secondary | ICD-10-CM

## 2015-03-14 DIAGNOSIS — F329 Major depressive disorder, single episode, unspecified: Secondary | ICD-10-CM

## 2015-03-14 DIAGNOSIS — F419 Anxiety disorder, unspecified: Secondary | ICD-10-CM

## 2015-03-14 DIAGNOSIS — Z7984 Long term (current) use of oral hypoglycemic drugs: Secondary | ICD-10-CM

## 2015-03-14 DIAGNOSIS — I129 Hypertensive chronic kidney disease with stage 1 through stage 4 chronic kidney disease, or unspecified chronic kidney disease: Secondary | ICD-10-CM

## 2015-03-14 DIAGNOSIS — E119 Type 2 diabetes mellitus without complications: Secondary | ICD-10-CM

## 2015-03-14 DIAGNOSIS — Z79818 Long term (current) use of other agents affecting estrogen receptors and estrogen levels: Secondary | ICD-10-CM

## 2015-03-14 DIAGNOSIS — M25551 Pain in right hip: Secondary | ICD-10-CM

## 2015-03-14 DIAGNOSIS — N183 Chronic kidney disease, stage 3 (moderate): Secondary | ICD-10-CM

## 2015-03-14 DIAGNOSIS — Z79899 Other long term (current) drug therapy: Secondary | ICD-10-CM

## 2015-03-14 DIAGNOSIS — E669 Obesity, unspecified: Secondary | ICD-10-CM

## 2015-03-14 MED ORDER — LEUPROLIDE ACETATE (4 MONTH) 30 MG IM KIT
30.0000 mg | PACK | Freq: Once | INTRAMUSCULAR | Status: AC
  Administered 2015-03-14: 30 mg via INTRAMUSCULAR
  Filled 2015-03-14: qty 30

## 2015-03-14 MED ORDER — DENOSUMAB 120 MG/1.7ML ~~LOC~~ SOLN
120.0000 mg | Freq: Once | SUBCUTANEOUS | Status: AC
  Administered 2015-03-14: 120 mg via SUBCUTANEOUS
  Filled 2015-03-14: qty 1.7

## 2015-03-14 NOTE — Progress Notes (Signed)
[place] OFFICE PROGRESS NOTE  Patient Care Team: Tracie Harrier, MD as PCP - General (Internal Medicine)   SUMMARY OF ONCOLOGIC HISTORY:  # 2014- PROSTATE CA [s/p Bx-Gleason score 5+4; s/p EBRT [Dr.Crystal];July 2015- Biochem Relapse [PSA-15]; non-compliance with Lupron; JAN 2016-PSA 50   # MAY 2016- PSA-100/Bone scan- Bone mets  [s/p EBRT Right Hip]; CT A/P- PELVIC/RP LN;   # July 2016- ? CASTRATE RESISTANT START ZYTIGA + Prednisone; AUG PSA 3.2; OCT 2016- CT- Improved Pelvic/RP LN ~10-17MM; Multiple bone lesions;OCT 2016-Bone scan- Neg for progressive mets.OCT 2016- 1.   # Bone mets- X-geva q 8W     INTERVAL HISTORY:  59 year old male patient with above history of metastatic prostate cancer question castrate resistant currently on first line therapy with Zytiga plus prednisone; Lupron every 4 months; X-geva is for follow-up. Patient continues to feel tired which is chronic. He continues to complain of pain in the right hip/right knee.   REVIEW OF SYSTEMS:  A complete 10 point review of system is done which is negative except mentioned above/history of present illness.   PAST MEDICAL HISTORY :  Past Medical History  Diagnosis Date  . Type II or unspecified type diabetes mellitus without mention of complication, uncontrolled   . Other and unspecified hyperlipidemia   . Essential hypertension, benign   . Myocardial infarction ([place])   . Anxiety   . Depression   . Obesity   . Varicosities   . Chronic kidney disease     stage 3  . Prostate cancer ([place])   . Cancer [facility])     states he had cancer in his left forearm but is unaware of type and was told his body took care of it    PAST SURGICAL HISTORY :   Past Surgical History  Procedure Laterality Date  . Colonoscopy  2013  . Intraoperative arteriogram      for vascular disease  . Colonoscopy with propofol N/A 01/23/2015    Procedure: COLONOSCOPY WITH PROPOFOL;  Surgeon: Hulen Luster, MD;  Location: [facility]  ENDOSCOPY;  Service: Gastroenterology;  Laterality: N/A;    FAMILY HISTORY :   Family History  Problem Relation Age of Onset  . Adopted: Yes    SOCIAL HISTORY:   Social History  Substance Use Topics  . Smoking status: Current Every Day Smoker -- 1.00 packs/day    Types: Cigarettes  . Smokeless tobacco: Never Used  . Alcohol Use: Yes     Comment: 28 12 ounce beers per week    ALLERGIES:  is allergic to codeine sulfate and prednisone.  MEDICATIONS:  Current Outpatient Prescriptions  Medication Sig Dispense Refill  . ALPRAZolam (XANAX) 0.5 MG tablet Take 0.5 mg by mouth at bedtime as needed for sleep.    Marland Kitchen ALPRAZolam (XANAX) 0.5 MG tablet Take by mouth.    Marland Kitchen amLODipine (NORVASC) 10 MG tablet Take 10 mg by mouth daily.    . ANUCORT-HC 25 MG suppository     . atenolol (TENORMIN) 100 MG tablet Take 100 mg by mouth daily.    Marland Kitchen gabapentin (NEURONTIN) 100 MG capsule Take by mouth.    . isosorbide mononitrate (IMDUR) 30 MG 24 hr tablet Take 30 mg by mouth daily.    Marland Kitchen lisinopril (PRINIVIL,ZESTRIL) 20 MG tablet     . metFORMIN (GLUCOPHAGE) 500 MG tablet Take 500 mg by mouth 2 (two) times daily with a meal.    . Multiple Vitamins-Minerals (MEGA MULTI MEN PO) Take by mouth.    Marland Kitchen  predniSONE (DELTASONE) 5 MG tablet Take 1 tablet (5 mg total) by mouth 2 (two) times daily with a meal. 60 tablet 6  . sertraline (ZOLOFT) 25 MG tablet Take by mouth.    . simvastatin (ZOCOR) 40 MG tablet Take 40 mg by mouth every evening.    . tamsulosin (FLOMAX) 0.4 MG CAPS capsule Take 1 capsule (0.4 mg total) by mouth daily after supper. 90 capsule 4  . Vitamin D, Ergocalciferol, (DRISDOL) 50000 UNITS CAPS capsule     . ZYTIGA 250 MG tablet     . metFORMIN (GLUCOPHAGE) 1000 MG tablet      No current facility-administered medications for this visit.    PHYSICAL EXAMINATION: ECOG PERFORMANCE STATUS: 2 - Symptomatic, <50% confined to bed  BP 135/76 mmHg  Pulse 53  Temp(Src) 96.9 F (36.1 C) (Tympanic)   Wt 208 lb 5.4 oz (94.5 kg)  Filed Weights   03/14/15 1343  Weight: 208 lb 5.4 oz (94.5 kg)    GENERAL: Well-nourished well-developed; Alert, no distress and comfortable.  Cannot sit on the exam table because of pain. Obese.  EYES: no pallor or icterus OROPHARYNX: no thrush or ulceration; good dentition  NECK: supple, no masses felt LYMPH:  no palpable lymphadenopathy in the cervical, axillary or inguinal regions LUNGS: clear to auscultation and  No wheeze or crackles HEART/CVS: regular rate & rhythm and no murmurs; No lower extremity edema ABDOMEN:abdomen soft, non-tender and normal bowel sounds Musculoskeletal:no cyanosis of digits and no clubbing  PSYCH: alert & oriented x 3 with fluent speech NEURO: no focal motor/sensory deficits SKIN:  no rashes or significant lesions  LABORATORY DATA:  I have reviewed the data as listed    Component Value Date/Time   NA 137 03/07/2015 1358   K 4.8 03/07/2015 1358   CL 105 03/07/2015 1358   CO2 25 03/07/2015 1358   GLUCOSE 232* 03/07/2015 1358   BUN 11 03/07/2015 1358   CREATININE 1.04 03/07/2015 1358   CALCIUM 8.8* 03/07/2015 1358   PROT 6.8 03/07/2015 1358   ALBUMIN 4.0 03/07/2015 1358   AST 26 03/07/2015 1358   ALT 17 03/07/2015 1358   ALKPHOS 67 03/07/2015 1358   BILITOT 0.7 03/07/2015 1358   GFRNONAA >60 03/07/2015 1358   GFRAA >60 03/07/2015 1358    No results found for: SPEP, UPEP  Lab Results  Component Value Date   WBC 7.9 03/07/2015   NEUTROABS 6.6* 03/07/2015   HGB 14.6 03/07/2015   HCT 42.8 03/07/2015   MCV 96.5 03/07/2015   PLT 217 03/07/2015      Chemistry      Component Value Date/Time   NA 137 03/07/2015 1358   K 4.8 03/07/2015 1358   CL 105 03/07/2015 1358   CO2 25 03/07/2015 1358   BUN 11 03/07/2015 1358   CREATININE 1.04 03/07/2015 1358      Component Value Date/Time   CALCIUM 8.8* 03/07/2015 1358   ALKPHOS 67 03/07/2015 1358   AST 26 03/07/2015 1358   ALT 17 03/07/2015 1358   BILITOT 0.7  03/07/2015 1358       RADIOGRAPHIC STUDIES: I have personally reviewed the radiological images as listed and agreed with the findings in the report. Nm Bone Scan Whole Body  03/12/2015  CLINICAL DATA:  59 year old male with history of metastatic prostate cancer. Current PSA of 1.01. EXAM: NUCLEAR MEDICINE WHOLE BODY BONE SCAN TECHNIQUE: Whole body anterior and posterior images were obtained approximately 3 hours after intravenous injection of radiopharmaceutical. RADIOPHARMACEUTICALS:  23 MCi Technetium-64m MDP IV COMPARISON:  10/10/2014 and prior bone scans dating back to 02/02/2013. FINDINGS: Focal areas of increased activity within the right hip and bilateral feet are unchanged and likely degenerative. Mild increased activity within the L2 and L3 vertebral bodies and mild heterogeneity of the ribs is improved. Focal increase uptake within the left skull again identified. No new or enlarging areas of focal increased activity identified. IMPRESSION: Slightly improved appearance of lumbar spine and rib activity. No evidence of new or progressive bony metastatic disease. Degenerative changes within the right hip and feet again noted. Electronically Signed   By: Margarette Canada M.D.   On: 03/12/2015 14:41   Ct Abdomen Pelvis W Contrast  03/13/2015  CLINICAL DATA:  Hx of prostate cancer many yrs ago with rad tx. Prostate cancer has returned and pt is currently on chemo. Hx of rad tx to lower spine tumor for pain relief. Pt c/o severe right femur and Right hip pain since March 2833. 59 year old male patient with above history of metastatic prostate cancer question castrate resistant currently on first line therapy with Zytiga plus prednisone; Lupron every 4 months; X-geva is for follow-up EXAM: CT ABDOMEN AND PELVIS WITH CONTRAST TECHNIQUE: Multidetector CT imaging of the abdomen and pelvis was performed using the standard protocol following bolus administration of intravenous contrast. CONTRAST:  142mL  OMNIPAQUE IOHEXOL 300 MG/ML  SOLN COMPARISON:  Bone scan, 03/12/2015.  CT, 10/11/2014. FINDINGS: Lung bases: 5 mm nodule in the left lower lobe, stable dating back to the CT dated 01/1913, benign. No other nodules. No consolidation. No pleural effusion. Heart normal in size. Liver: Calcification along the surface of the posterior segment right lobe. There is also 6 mm cyst along the posterior inferior margin of the posterior segment of the right lobe. These are stable. No other abnormality. Spleen, gallbladder, pancreas, adrenal glands:  Unremarkable. Kidneys, ureters, bladder: 17 mm mildly hypoattenuating mass arises from the lateral lower pole of the right kidney. This is stable. Similar appearing 15 mm mass arises from the posterior medial lower pole the right kidney also stable. These are both consistent with cysts complicated by proteinaceous material or previous hemorrhage. There are other small bilateral low-density lesions that are also stable consistent with cysts. No hydronephrosis. Ureters are normal course and caliber. Bladder is mildly distended. Wall is mildly prominent as it was on the CT dated 06/28/2014. No bladder mass or stone. Prostate gland: Prostate is relatively small. There is hazy opacity in the fat adjacent to the prostate and seminal vesicles similar to the prior study. No convincing locally invasive prostate carcinoma. Lymph nodes: There are enlarged inguinal lymph nodes. Largest is on the left measuring 17 mm in short axis, without change from the prior CT. Left common iliac node currently measures 8 mm short axis, previously 12 mm. Node anterior to the aorta and to the right of the superior mesenteric artery currently measures 7 mm in short axis, previously 11 mm. Node anterior to the hepatic artery and above the pancreatic head currently measures 8 mm in short axis, previously 14 mm. Multiple other lymph nodes, previously prominent or mildly enlarged, have decreased in size. No new  adenopathy. Ascites:  None. Gastrointestinal:  Unremarkable.  Normal appendix visualized. Musculoskeletal: There multiple new sclerotic lesions within the visualized spine and pelvis when compared to the CTs dated 10/11/2014 and 06/28/2014. The largest of these lies within the L3 vertebrae. IMPRESSION: 1. Osseous metastatic disease has increased with multiple new sclerotic bone lesions evident  in the visualized spine and pelvis. However, given that the recent bone scan suggested improvement, the current areas of sclerosis may be inactive/ treated areas of metastatic disease. These were not evident on the most recent prior CT. 2. There has been definite improvement in the previously described pelvic and abdominal adenopathy. Although the inguinal lobes are essentially stable from the most recent prior CT, the pelvic, retroperitoneal and upper abdominal adenopathy has all improved. There is no new adenopathy. 3. No other evidence of metastatic disease.  No other change. Electronically Signed   By: Lajean Manes M.D.   On: 03/13/2015 09:52     ASSESSMENT & PLAN:   # METASTATIC CASTRATE RESISTANT PROSTATE CANCER currently on first line therapy with Zytiga plus prednisone since July 2016/Lupron every 4 months.  PSA March 07 2015- 1; along with imaging of the chest abdomen pelvis/bone scan- patient seems to be responding to current therapy.  Patient seems to be tolerating Zytiga plus prednisone very well. Continue the same.  # Chronic pain on narcotics- new prescription given.  # Bone metatstasis- on X-geva change to to q 8 weeks. No concerns for osteonecrosis of the jaw.  # RIGHT HIP Pain/R knee pain - question osteoarthritis; less likley metastasis from prostate cancer/as bone scan is not active. Clinically this is more consistent with osteoarthritis rather than metastatic prostate cancer. Recommend evaluation with orthopedics.  # Patient follow-up with CBC BMP PSA in approximately 2 months/X [place].  No  orders of the defined types were placed in this encounter.   All questions were answered. The patient knows to call the clinic with any problems, questions or concerns. No barriers to learning was detected. I spent 25 minutes counseling the patient face to face. The total time spent in the appointment was 30 minutes and more than 50% was on counseling and review of test results     Cammie Sickle, MD 03/14/2015 1:46 PM

## 2015-03-14 NOTE — Progress Notes (Signed)
Pt alert x 3, ambulates without assistance, pt complains of severe ongoing right hip/leg pain rates pain a 10 oof 10.  Patient states morphine prescribed is not helping the pain.  Will notify the Dr. Berneice Gandy patient's complaints.

## 2015-03-20 ENCOUNTER — Telehealth: Payer: Self-pay | Admitting: *Deleted

## 2015-03-20 NOTE — Telephone Encounter (Signed)
Called and left message on answering machine at pt's home.  Not sure if ortho called with his appt but I called to check on the appt again today and they finally have appt for 11/19 9 am to see Dr. Rudene Christians.  I also told him if he has questions to call me.

## 2015-03-27 ENCOUNTER — Encounter: Payer: Self-pay | Admitting: *Deleted

## 2015-03-27 ENCOUNTER — Telehealth: Payer: Self-pay | Admitting: *Deleted

## 2015-03-27 ENCOUNTER — Other Ambulatory Visit: Payer: Self-pay | Admitting: *Deleted

## 2015-03-27 ENCOUNTER — Other Ambulatory Visit: Payer: Self-pay | Admitting: Internal Medicine

## 2015-03-27 DIAGNOSIS — C61 Malignant neoplasm of prostate: Secondary | ICD-10-CM

## 2015-03-27 MED ORDER — MORPHINE SULFATE (CONCENTRATE) 20 MG/ML PO SOLN: 20.0000 mg | ORAL | Status: DC | PRN

## 2015-03-27 NOTE — Telephone Encounter (Signed)
Patient called. Needs RF on roxanol 20mg /ml. Take 0.5 - 1 mL (10 - 20 mg) once every 6-8 hours as needed for pain.  Dr. Rogue Bussing notified.  Patient states that he had labs at [facility], which were slightly abnormal. I reviewed these labs with the patient. hgb 13.6, which is normal in our cancer center perimeters. I explained to the patient that different blood machines have a slight variation in normal ranges. I reassured patient that these were actually ok. The labs had not changed since the last visit in the cancer center a few weeks ago. The patient thanked me for the reassurance.

## 2015-04-17 ENCOUNTER — Other Ambulatory Visit: Payer: Self-pay | Admitting: *Deleted

## 2015-04-17 ENCOUNTER — Encounter: Payer: Self-pay | Admitting: *Deleted

## 2015-04-17 DIAGNOSIS — C61 Malignant neoplasm of prostate: Secondary | ICD-10-CM

## 2015-04-17 DIAGNOSIS — C771 Secondary and unspecified malignant neoplasm of intrathoracic lymph nodes: Principal | ICD-10-CM

## 2015-04-17 MED ORDER — ZYTIGA 250 MG PO TABS: 1000.0000 mg | ORAL_TABLET | Freq: Every day | ORAL | Status: DC

## 2015-04-17 NOTE — Progress Notes (Signed)
Zytiga 250 mg tablets. Patient takes 4 tabs daily. RX renewed-faxed to [facility] 04/17/15 at 1032am

## 2015-04-23 ENCOUNTER — Encounter
Admission: RE | Admit: 2015-04-23 | Discharge: 2015-04-23 | Disposition: A | Discharge status: Home / Self Care | Payer: 59 | Source: Ambulatory Visit | Attending: Orthopedic Surgery | Admitting: Orthopedic Surgery

## 2015-04-23 DIAGNOSIS — Z01812 Encounter for preprocedural laboratory examination: Secondary | ICD-10-CM | POA: Insufficient documentation

## 2015-04-23 DIAGNOSIS — Z01810 Encounter for preprocedural cardiovascular examination: Secondary | ICD-10-CM | POA: Insufficient documentation

## 2015-04-23 HISTORY — DX: Nausea with vomiting, unspecified: R11.2

## 2015-04-23 HISTORY — DX: Family history of other specified conditions: Z84.89

## 2015-04-23 HISTORY — DX: Nausea with vomiting, unspecified: Z98.890

## 2015-04-23 LAB — TYPE AND SCREEN
ABO/RH(D): O POS
Antibody Screen: NEGATIVE

## 2015-04-23 LAB — BASIC METABOLIC PANEL
Anion gap: 10 (ref 5–15)
BUN: 21 mg/dL — AB (ref 6–20)
CHLORIDE: 102 mmol/L (ref 101–111)
CO2: 24 mmol/L (ref 22–32)
CREATININE: 0.88 mg/dL (ref 0.61–1.24)
Calcium: 9.6 mg/dL (ref 8.9–10.3)
GFR calc Af Amer: >60 mL/min (ref >60)
GFR calc non Af Amer: >60 mL/min (ref >60)
Glucose, Bld: 151 mg/dL — ABNORMAL HIGH (ref 65–99)
Potassium: 4 mmol/L (ref 3.5–5.1)
SODIUM: 136 mmol/L (ref 135–145)

## 2015-04-23 LAB — SEDIMENTATION RATE: Sed Rate: 17 mm/hr (ref 0–20)

## 2015-04-23 LAB — CBC
HEMATOCRIT: 39.8 % — AB (ref 40.0–52.0)
HEMOGLOBIN: 13.1 g/dL (ref 13.0–18.0)
MCH: 32 pg (ref 26.0–34.0)
MCHC: 33.1 g/dL (ref 32.0–36.0)
MCV: 96.8 fL (ref 80.0–100.0)
Platelets: 220 10*3/uL (ref 150–440)
RBC: 4.11 MIL/uL — AB (ref 4.40–5.90)
RDW: 14.1 % (ref 11.5–14.5)
WBC: 7.6 10*3/uL (ref 3.8–10.6)

## 2015-04-23 LAB — ABO/RH: ABO/RH(D): O POS

## 2015-04-23 LAB — URINALYSIS COMPLETE WITH MICROSCOPIC (ARMC ONLY)
Bacteria, UA: NONE SEEN
Bilirubin Urine: NEGATIVE
Glucose, UA: NEGATIVE mg/dL
HGB URINE DIPSTICK: NEGATIVE
Ketones, ur: NEGATIVE mg/dL
Leukocytes, UA: NEGATIVE
NITRITE: NEGATIVE
PH: 5 (ref 5.0–8.0)
PROTEIN: 100 mg/dL — AB
SPECIFIC GRAVITY, URINE: 1.004 — AB (ref 1.005–1.030)
Squamous Epithelial / HPF: NONE SEEN
WBC UA: NONE SEEN WBC/hpf (ref 0–5)

## 2015-04-23 LAB — PROTIME-INR
INR: 0.99
Prothrombin Time: 13.3 seconds (ref 11.4–15.0)

## 2015-04-23 LAB — APTT: aPTT: 30 seconds (ref 24–36)

## 2015-04-23 LAB — SURGICAL PCR SCREEN
MRSA, PCR: NEGATIVE
Staphylococcus aureus: NEGATIVE

## 2015-04-23 NOTE — Patient Instructions (Addendum)
  Your procedure is scheduled on: Thursday 05/01/2015 Report to Day Surgery. 2ND FLOOR MEDICAL MALL ENTRANCE To find out your arrival time please call [phone] between 1PM - 3PM on Wednesday 04/30/2015.  Remember: Instructions that are not followed completely may result in serious medical risk, up to and including death, or upon the discretion of your surgeon and anesthesiologist your surgery may need to be rescheduled.    __X__ 1. Do not eat food or drink liquids after midnight. No gum chewing or hard candies.     __X__ 2. No Alcohol for 24 hours before or after surgery.   ____ 3. Bring all medications with you on the day of surgery if instructed.    __X__ 4. Notify your doctor if there is any change in your medical condition     (cold, fever, infections).     Do not wear jewelry, make-up, hairpins, clips or nail polish.  Do not wear lotions, powders, or perfumes.   Do not shave 48 hours prior to surgery. Men may shave face and neck.  Do not bring valuables to the hospital.    [facility] is not responsible for any belongings or valuables.               Contacts, dentures or bridgework may not be worn into surgery.  Leave your suitcase in the car. After surgery it may be brought to your room.  For patients admitted to the hospital, discharge time is determined by your                treatment team.   Patients discharged the day of surgery will not be allowed to drive home.   Please read over the following fact sheets that you were given:   MRSA Information and Surgical Site Infection Prevention   __X__ Take these medicines the morning of surgery with A SIP OF WATER:    1. ALPRAZOLAM AND/OR MORPHINE IF NEEDED  2. AMLODIPINE  3. ATENOLOL  4. ISOSORBIDE  5. LISINOPRIL  6. PREDNISONE  7. SERTRALINE  8. Call Oncologist to see if your able to hold Zytiga the 1 day of surgery or if there are any interactions with anesthesia.  ____ Fleet Enema (as directed)   __X__ Use CHG  Soap as directed  ____ Use inhalers on the day of surgery  __X__ Stop metformin 2 days prior to surgery    ____ Take 1/2 of usual insulin dose the night before surgery and none on the morning of surgery.   ____ Stop Coumadin/Plavix/aspirin on   ____ Stop Anti-inflammatories on    ____ Stop supplements until after surgery.    ____ Bring C-Pap to the hospital.

## 2015-04-24 LAB — URINE CULTURE: CULTURE: NO GROWTH

## 2015-05-01 ENCOUNTER — Inpatient Hospital Stay: Payer: 59

## 2015-05-01 ENCOUNTER — Inpatient Hospital Stay
Admission: AD | Admit: 2015-05-01 | Discharge: 2015-05-04 | DRG: 470 | Disposition: A | Discharge status: Home / Self Care | Payer: 59 | Source: Ambulatory Visit | Attending: Orthopedic Surgery | Admitting: Orthopedic Surgery

## 2015-05-01 ENCOUNTER — Encounter: Payer: Self-pay | Admitting: *Deleted

## 2015-05-01 ENCOUNTER — Encounter
Admission: AD | Disposition: A | Discharge status: Home / Self Care | Payer: Self-pay | Source: Ambulatory Visit | Attending: Orthopedic Surgery

## 2015-05-01 ENCOUNTER — Inpatient Hospital Stay: Payer: 59 | Admitting: Anesthesiology

## 2015-05-01 DIAGNOSIS — F329 Major depressive disorder, single episode, unspecified: Secondary | ICD-10-CM | POA: Diagnosis present

## 2015-05-01 DIAGNOSIS — G8918 Other acute postprocedural pain: Secondary | ICD-10-CM

## 2015-05-01 DIAGNOSIS — E1122 Type 2 diabetes mellitus with diabetic chronic kidney disease: Secondary | ICD-10-CM | POA: Diagnosis present

## 2015-05-01 DIAGNOSIS — Z8546 Personal history of malignant neoplasm of prostate: Secondary | ICD-10-CM

## 2015-05-01 DIAGNOSIS — Z96641 Presence of right artificial hip joint: Secondary | ICD-10-CM | POA: Diagnosis present

## 2015-05-01 DIAGNOSIS — M1611 Unilateral primary osteoarthritis, right hip: Secondary | ICD-10-CM | POA: Diagnosis present

## 2015-05-01 DIAGNOSIS — I252 Old myocardial infarction: Secondary | ICD-10-CM

## 2015-05-01 DIAGNOSIS — N189 Chronic kidney disease, unspecified: Secondary | ICD-10-CM | POA: Diagnosis present

## 2015-05-01 DIAGNOSIS — E785 Hyperlipidemia, unspecified: Secondary | ICD-10-CM | POA: Diagnosis present

## 2015-05-01 DIAGNOSIS — I129 Hypertensive chronic kidney disease with stage 1 through stage 4 chronic kidney disease, or unspecified chronic kidney disease: Secondary | ICD-10-CM | POA: Diagnosis present

## 2015-05-01 DIAGNOSIS — Z96649 Presence of unspecified artificial hip joint: Secondary | ICD-10-CM

## 2015-05-01 HISTORY — PX: TOTAL HIP ARTHROPLASTY: SHX124

## 2015-05-01 LAB — CBC
HEMATOCRIT: 36.5 % — AB (ref 40.0–52.0)
HEMOGLOBIN: 12.1 g/dL — AB (ref 13.0–18.0)
MCH: 32.2 pg (ref 26.0–34.0)
MCHC: 33.1 g/dL (ref 32.0–36.0)
MCV: 97.4 fL (ref 80.0–100.0)
PLATELETS: 184 10*3/uL (ref 150–440)
RBC: 3.75 MIL/uL — AB (ref 4.40–5.90)
RDW: 14.5 % (ref 11.5–14.5)
WBC: 12.3 10*3/uL — AB (ref 3.8–10.6)

## 2015-05-01 LAB — CREATININE, SERUM
CREATININE: 1.22 mg/dL (ref 0.61–1.24)
GFR calc Af Amer: >60 mL/min (ref >60)

## 2015-05-01 LAB — GLUCOSE, CAPILLARY
Glucose-Capillary: 179 mg/dL — ABNORMAL HIGH (ref 65–99)
Glucose-Capillary: 290 mg/dL — ABNORMAL HIGH (ref 65–99)
Glucose-Capillary: 232 mg/dL — ABNORMAL HIGH (ref 65–99)

## 2015-05-01 SURGERY — ARTHROPLASTY, HIP, TOTAL, ANTERIOR APPROACH
Anesthesia: Choice | Laterality: Right

## 2015-05-01 MED ORDER — ALPRAZOLAM 0.5 MG PO TABS
0.5000 mg | ORAL_TABLET | Freq: Two times a day (BID) | ORAL | Status: DC | PRN
  Administered 2015-05-03: 0.5 mg via ORAL
  Filled 2015-05-01: qty 1

## 2015-05-01 MED ORDER — MAGNESIUM CITRATE PO SOLN
1.0000 | Freq: Once | ORAL | Status: DC | PRN
  Filled 2015-05-01: qty 296

## 2015-05-01 MED ORDER — MIDAZOLAM HCL 5 MG/5ML IJ SOLN
INTRAMUSCULAR | Status: DC | PRN
  Administered 2015-05-01: 2 mg via INTRAVENOUS

## 2015-05-01 MED ORDER — HYDROMORPHONE HCL 1 MG/ML IJ SOLN
INTRAMUSCULAR | Status: AC
  Filled 2015-05-01: qty 1

## 2015-05-01 MED ORDER — METHOCARBAMOL 1000 MG/10ML IJ SOLN
500.0000 mg | Freq: Four times a day (QID) | INTRAVENOUS | Status: DC | PRN
  Filled 2015-05-01: qty 5

## 2015-05-01 MED ORDER — IPRATROPIUM-ALBUTEROL 0.5-2.5 (3) MG/3ML IN SOLN
3.0000 mL | Freq: Four times a day (QID) | RESPIRATORY_TRACT | Status: DC
  Administered 2015-05-01 – 2015-05-02 (×2): 3 mL via RESPIRATORY_TRACT
  Filled 2015-05-01 (×2): qty 3

## 2015-05-01 MED ORDER — ONDANSETRON HCL 4 MG PO TABS
4.0000 mg | ORAL_TABLET | Freq: Four times a day (QID) | ORAL | Status: DC | PRN
  Administered 2015-05-02 – 2015-05-04 (×4): 4 mg via ORAL
  Filled 2015-05-01 (×4): qty 1

## 2015-05-01 MED ORDER — ACETAMINOPHEN 325 MG PO TABS: 650.0000 mg | ORAL_TABLET | Freq: Four times a day (QID) | ORAL | Status: DC | PRN

## 2015-05-01 MED ORDER — IPRATROPIUM-ALBUTEROL 0.5-2.5 (3) MG/3ML IN SOLN
3.0000 mL | Freq: Once | RESPIRATORY_TRACT | Status: AC
  Administered 2015-05-01: 3 mL via RESPIRATORY_TRACT

## 2015-05-01 MED ORDER — HYDROMORPHONE HCL 1 MG/ML IJ SOLN
0.2500 mg | Freq: Once | INTRAMUSCULAR | Status: AC
  Administered 2015-05-01: 0.25 mg via INTRAVENOUS

## 2015-05-01 MED ORDER — ISOSORBIDE MONONITRATE ER 30 MG PO TB24
30.0000 mg | ORAL_TABLET | Freq: Every day | ORAL | Status: DC
  Administered 2015-05-02 – 2015-05-04 (×3): 30 mg via ORAL
  Filled 2015-05-01 (×3): qty 1

## 2015-05-01 MED ORDER — MORPHINE SULFATE (CONCENTRATE) 10 MG /0.5 ML PO SOLN
10.0000 mg | Freq: Four times a day (QID) | ORAL | Status: DC | PRN
  Administered 2015-05-01: 10 mg via ORAL
  Filled 2015-05-01: qty 1

## 2015-05-01 MED ORDER — PNEUMOCOCCAL VAC POLYVALENT 25 MCG/0.5ML IJ INJ
0.5000 mL | INJECTION | INTRAMUSCULAR | Status: AC
  Administered 2015-05-02: 0.5 mL via INTRAMUSCULAR
  Filled 2015-05-01: qty 0.5

## 2015-05-01 MED ORDER — CEFAZOLIN SODIUM-DEXTROSE 2-3 GM-% IV SOLR
2.0000 g | Freq: Four times a day (QID) | INTRAVENOUS | Status: AC
  Administered 2015-05-01 – 2015-05-02 (×3): 2 g via INTRAVENOUS
  Filled 2015-05-01 (×3): qty 50

## 2015-05-01 MED ORDER — FAMOTIDINE 20 MG PO TABS
ORAL_TABLET | ORAL | Status: AC
  Filled 2015-05-01: qty 1

## 2015-05-01 MED ORDER — METHOCARBAMOL 1000 MG/10ML IJ SOLN: 500.0000 mg | Freq: Four times a day (QID) | INTRAVENOUS | Status: DC | PRN

## 2015-05-01 MED ORDER — DICYCLOMINE HCL 10 MG PO CAPS
10.0000 mg | ORAL_CAPSULE | Freq: Three times a day (TID) | ORAL | Status: DC
  Administered 2015-05-01 – 2015-05-04 (×12): 10 mg via ORAL
  Filled 2015-05-01 (×13): qty 1

## 2015-05-01 MED ORDER — ENOXAPARIN SODIUM 40 MG/0.4ML ~~LOC~~ SOLN
40.0000 mg | SUBCUTANEOUS | Status: DC
  Administered 2015-05-02 – 2015-05-04 (×3): 40 mg via SUBCUTANEOUS
  Filled 2015-05-01 (×3): qty 0.4

## 2015-05-01 MED ORDER — SODIUM CHLORIDE 0.9 % IV SOLN
INTRAVENOUS | Status: DC
  Administered 2015-05-01 (×3): via INTRAVENOUS

## 2015-05-01 MED ORDER — MENTHOL 3 MG MT LOZG
1.0000 | LOZENGE | OROMUCOSAL | Status: DC | PRN
  Filled 2015-05-01: qty 9

## 2015-05-01 MED ORDER — ACETAMINOPHEN 650 MG RE SUPP: 650.0000 mg | Freq: Four times a day (QID) | RECTAL | Status: DC | PRN

## 2015-05-01 MED ORDER — ADULT MULTIVITAMIN W/MINERALS CH
1.0000 | ORAL_TABLET | Freq: Every day | ORAL | Status: DC
  Administered 2015-05-01 – 2015-05-04 (×4): 1 via ORAL
  Filled 2015-05-01 (×4): qty 1

## 2015-05-01 MED ORDER — ABIRATERONE ACETATE 250 MG PO TABS
1000.0000 mg | ORAL_TABLET | Freq: Every day | ORAL | Status: DC
  Administered 2015-05-02 – 2015-05-03 (×2): 1000 mg via ORAL
  Filled 2015-05-01 (×5): qty 4

## 2015-05-01 MED ORDER — NEOMYCIN-POLYMYXIN B GU 40-200000 IR SOLN
Status: AC
Start: 2015-05-01 — End: 2015-05-01
  Filled 2015-05-01: qty 4

## 2015-05-01 MED ORDER — ONDANSETRON HCL 4 MG/2ML IJ SOLN: 4.0000 mg | Freq: Once | INTRAMUSCULAR | Status: DC | PRN

## 2015-05-01 MED ORDER — ONDANSETRON HCL 4 MG/2ML IJ SOLN
4.0000 mg | Freq: Four times a day (QID) | INTRAMUSCULAR | Status: DC | PRN
  Administered 2015-05-01: 4 mg via INTRAVENOUS
  Filled 2015-05-01: qty 2

## 2015-05-01 MED ORDER — PHENOL 1.4 % MT LIQD
1.0000 | OROMUCOSAL | Status: DC | PRN
  Filled 2015-05-01: qty 177

## 2015-05-01 MED ORDER — METHYLPREDNISOLONE SODIUM SUCC 125 MG IJ SOLR
INTRAMUSCULAR | Status: AC
  Filled 2015-05-01: qty 2

## 2015-05-01 MED ORDER — FENTANYL CITRATE (PF) 100 MCG/2ML IJ SOLN
INTRAMUSCULAR | Status: AC
  Filled 2015-05-01: qty 2

## 2015-05-01 MED ORDER — BISACODYL 10 MG RE SUPP: 10.0000 mg | Freq: Every day | RECTAL | Status: DC | PRN

## 2015-05-01 MED ORDER — FAMOTIDINE 20 MG PO TABS
20.0000 mg | ORAL_TABLET | Freq: Once | ORAL | Status: AC
  Administered 2015-05-01: 20 mg via ORAL

## 2015-05-01 MED ORDER — GLYCOPYRROLATE 0.2 MG/ML IJ SOLN
INTRAMUSCULAR | Status: DC | PRN
  Administered 2015-05-01: 0.1 mg via INTRAVENOUS

## 2015-05-01 MED ORDER — MAGNESIUM HYDROXIDE 400 MG/5ML PO SUSP: 30.0000 mL | Freq: Every day | ORAL | Status: DC | PRN

## 2015-05-01 MED ORDER — FENTANYL CITRATE (PF) 100 MCG/2ML IJ SOLN
INTRAMUSCULAR | Status: AC
  Administered 2015-05-01: 25 ug via INTRAVENOUS
  Filled 2015-05-01: qty 2

## 2015-05-01 MED ORDER — METFORMIN HCL 500 MG PO TABS
1000.0000 mg | ORAL_TABLET | Freq: Every day | ORAL | Status: DC
  Administered 2015-05-02 – 2015-05-04 (×3): 1000 mg via ORAL
  Filled 2015-05-01 (×3): qty 2

## 2015-05-01 MED ORDER — FENTANYL CITRATE (PF) 100 MCG/2ML IJ SOLN
INTRAMUSCULAR | Status: DC | PRN
  Administered 2015-05-01 (×3): 50 ug via INTRAVENOUS
  Administered 2015-05-01: 100 ug via INTRAVENOUS
  Administered 2015-05-01 (×2): 50 ug via INTRAVENOUS

## 2015-05-01 MED ORDER — FENTANYL CITRATE (PF) 100 MCG/2ML IJ SOLN
25.0000 ug | INTRAMUSCULAR | Status: AC | PRN
  Administered 2015-05-01 (×6): 25 ug via INTRAVENOUS

## 2015-05-01 MED ORDER — TAMSULOSIN HCL 0.4 MG PO CAPS
0.4000 mg | ORAL_CAPSULE | Freq: Every day | ORAL | Status: DC
  Administered 2015-05-01 – 2015-05-03 (×3): 0.4 mg via ORAL
  Filled 2015-05-01 (×3): qty 1

## 2015-05-01 MED ORDER — SERTRALINE HCL 50 MG PO TABS
25.0000 mg | ORAL_TABLET | Freq: Every day | ORAL | Status: DC
  Administered 2015-05-01 – 2015-05-04 (×4): 25 mg via ORAL
  Filled 2015-05-01 (×4): qty 1

## 2015-05-01 MED ORDER — INSULIN ASPART 100 UNIT/ML ~~LOC~~ SOLN
0.0000 [IU] | Freq: Three times a day (TID) | SUBCUTANEOUS | Status: DC
  Administered 2015-05-01: 5 [IU] via SUBCUTANEOUS
  Administered 2015-05-02: 8 [IU] via SUBCUTANEOUS
  Administered 2015-05-02 (×2): 5 [IU] via SUBCUTANEOUS
  Administered 2015-05-03 (×2): 3 [IU] via SUBCUTANEOUS
  Administered 2015-05-03: 2 [IU] via SUBCUTANEOUS
  Administered 2015-05-04: 3 [IU] via SUBCUTANEOUS
  Filled 2015-05-01: qty 2
  Filled 2015-05-01 (×2): qty 5
  Filled 2015-05-01 (×2): qty 3
  Filled 2015-05-01: qty 8
  Filled 2015-05-01: qty 3
  Filled 2015-05-01: qty 4

## 2015-05-01 MED ORDER — METHOCARBAMOL 500 MG PO TABS: 500.0000 mg | ORAL_TABLET | Freq: Four times a day (QID) | ORAL | Status: DC | PRN

## 2015-05-01 MED ORDER — GABAPENTIN 100 MG PO CAPS
100.0000 mg | ORAL_CAPSULE | Freq: Every day | ORAL | Status: DC
  Administered 2015-05-01 – 2015-05-03 (×3): 100 mg via ORAL
  Filled 2015-05-01 (×3): qty 1

## 2015-05-01 MED ORDER — TRANEXAMIC ACID 1000 MG/10ML IV SOLN
INTRAVENOUS | Status: AC
  Filled 2015-05-01: qty 10

## 2015-05-01 MED ORDER — METHOCARBAMOL 500 MG PO TABS
500.0000 mg | ORAL_TABLET | Freq: Four times a day (QID) | ORAL | Status: DC | PRN
  Administered 2015-05-01 – 2015-05-02 (×2): 500 mg via ORAL
  Filled 2015-05-01 (×2): qty 1

## 2015-05-01 MED ORDER — ACETAMINOPHEN 10 MG/ML IV SOLN
INTRAVENOUS | Status: DC | PRN
  Administered 2015-05-01: 1000 mg via INTRAVENOUS

## 2015-05-01 MED ORDER — ZOLPIDEM TARTRATE 5 MG PO TABS: 5.0000 mg | ORAL_TABLET | Freq: Every evening | ORAL | Status: DC | PRN

## 2015-05-01 MED ORDER — METOCLOPRAMIDE HCL 5 MG PO TABS
5.0000 mg | ORAL_TABLET | Freq: Three times a day (TID) | ORAL | Status: DC | PRN
  Administered 2015-05-03: 5 mg via ORAL
  Filled 2015-05-01: qty 1

## 2015-05-01 MED ORDER — IPRATROPIUM-ALBUTEROL 0.5-2.5 (3) MG/3ML IN SOLN
RESPIRATORY_TRACT | Status: AC
  Filled 2015-05-01: qty 3

## 2015-05-01 MED ORDER — ROCURONIUM BROMIDE 100 MG/10ML IV SOLN
INTRAVENOUS | Status: DC | PRN
  Administered 2015-05-01: 40 mg via INTRAVENOUS
  Administered 2015-05-01: 10 mg via INTRAVENOUS

## 2015-05-01 MED ORDER — SUCCINYLCHOLINE CHLORIDE 20 MG/ML IJ SOLN
INTRAMUSCULAR | Status: DC | PRN
Start: 2015-05-01 — End: 2015-05-01
  Administered 2015-05-01: 100 mg via INTRAVENOUS

## 2015-05-01 MED ORDER — ATENOLOL 50 MG PO TABS
100.0000 mg | ORAL_TABLET | Freq: Every day | ORAL | Status: DC
  Administered 2015-05-02 – 2015-05-04 (×3): 100 mg via ORAL
  Filled 2015-05-01 (×3): qty 2

## 2015-05-01 MED ORDER — FUROSEMIDE 10 MG/ML IJ SOLN
20.0000 mg | Freq: Once | INTRAMUSCULAR | Status: AC
  Administered 2015-05-01: 20 mg via INTRAVENOUS

## 2015-05-01 MED ORDER — MORPHINE SULFATE (PF) 2 MG/ML IV SOLN
2.0000 mg | INTRAVENOUS | Status: DC | PRN
  Administered 2015-05-01 (×2): 2 mg via INTRAVENOUS
  Filled 2015-05-01 (×2): qty 1

## 2015-05-01 MED ORDER — CEFAZOLIN SODIUM-DEXTROSE 2-3 GM-% IV SOLR
INTRAVENOUS | Status: AC
  Administered 2015-05-01: 2 g via INTRAVENOUS
  Filled 2015-05-01: qty 50

## 2015-05-01 MED ORDER — CEFAZOLIN SODIUM-DEXTROSE 2-3 GM-% IV SOLR: 2.0000 g | Freq: Once | INTRAVENOUS | Status: DC

## 2015-05-01 MED ORDER — OXYCODONE HCL 5 MG PO TABS
5.0000 mg | ORAL_TABLET | ORAL | Status: DC | PRN
  Administered 2015-05-02 (×2): 10 mg via ORAL
  Administered 2015-05-02: 5 mg via ORAL
  Administered 2015-05-03 – 2015-05-04 (×5): 10 mg via ORAL
  Administered 2015-05-04: 5 mg via ORAL
  Administered 2015-05-04: 10 mg via ORAL
  Filled 2015-05-01 (×4): qty 2
  Filled 2015-05-01: qty 1
  Filled 2015-05-01 (×3): qty 2
  Filled 2015-05-01: qty 1
  Filled 2015-05-01: qty 2

## 2015-05-01 MED ORDER — METHYLPREDNISOLONE SODIUM SUCC 125 MG IJ SOLR
125.0000 mg | Freq: Once | INTRAMUSCULAR | Status: AC
  Administered 2015-05-01: 125 mg via INTRAVENOUS

## 2015-05-01 MED ORDER — DIPHENHYDRAMINE HCL 12.5 MG/5ML PO ELIX: 12.5000 mg | ORAL_SOLUTION | ORAL | Status: DC | PRN

## 2015-05-01 MED ORDER — BUPIVACAINE-EPINEPHRINE 0.25% -1:200000 IJ SOLN
INTRAMUSCULAR | Status: DC | PRN
  Administered 2015-05-01: 30 mL

## 2015-05-01 MED ORDER — HYDROCORTISONE ACETATE 25 MG RE SUPP
25.0000 mg | Freq: Two times a day (BID) | RECTAL | Status: DC | PRN
  Filled 2015-05-01: qty 1

## 2015-05-01 MED ORDER — CETYLPYRIDINIUM CHLORIDE 0.05 % MT LIQD
7.0000 mL | Freq: Two times a day (BID) | OROMUCOSAL | Status: DC
  Administered 2015-05-01 – 2015-05-03 (×5): 7 mL via OROMUCOSAL

## 2015-05-01 MED ORDER — BUPIVACAINE-EPINEPHRINE (PF) 0.25% -1:200000 IJ SOLN
INTRAMUSCULAR | Status: AC
  Filled 2015-05-01: qty 30

## 2015-05-01 MED ORDER — TRANEXAMIC ACID 1000 MG/10ML IV SOLN
1000.0000 mg | INTRAVENOUS | Status: DC | PRN
  Administered 2015-05-01: 1000 mg via INTRAVENOUS

## 2015-05-01 MED ORDER — LIDOCAINE HCL (CARDIAC) 20 MG/ML IV SOLN
INTRAVENOUS | Status: DC | PRN
  Administered 2015-05-01: 100 mg via INTRAVENOUS

## 2015-05-01 MED ORDER — IPRATROPIUM-ALBUTEROL 0.5-2.5 (3) MG/3ML IN SOLN
RESPIRATORY_TRACT | Status: AC
  Administered 2015-05-01: 3 mL via RESPIRATORY_TRACT
  Filled 2015-05-01: qty 3

## 2015-05-01 MED ORDER — AMLODIPINE BESYLATE 10 MG PO TABS
10.0000 mg | ORAL_TABLET | Freq: Every day | ORAL | Status: DC
  Administered 2015-05-02 – 2015-05-04 (×3): 10 mg via ORAL
  Filled 2015-05-01 (×3): qty 1

## 2015-05-01 MED ORDER — LISINOPRIL 20 MG PO TABS
20.0000 mg | ORAL_TABLET | Freq: Every day | ORAL | Status: DC
  Administered 2015-05-02 – 2015-05-04 (×3): 20 mg via ORAL
  Filled 2015-05-01 (×3): qty 1

## 2015-05-01 MED ORDER — METOCLOPRAMIDE HCL 5 MG/ML IJ SOLN: 5.0000 mg | Freq: Three times a day (TID) | INTRAMUSCULAR | Status: DC | PRN

## 2015-05-01 MED ORDER — PROPOFOL 10 MG/ML IV BOLUS
INTRAVENOUS | Status: DC | PRN
  Administered 2015-05-01: 50 mg via INTRAVENOUS
  Administered 2015-05-01: 150 mg via INTRAVENOUS

## 2015-05-01 MED ORDER — FUROSEMIDE 10 MG/ML IJ SOLN
INTRAMUSCULAR | Status: AC
  Administered 2015-05-01: 20 mg via INTRAVENOUS
  Filled 2015-05-01: qty 2

## 2015-05-01 MED ORDER — SODIUM CHLORIDE 0.9 % IV SOLN: INTRAVENOUS | Status: DC

## 2015-05-01 MED ORDER — MEGA MULTI MEN PO TBCR: EXTENDED_RELEASE_TABLET | Freq: Every day | ORAL | Status: DC

## 2015-05-01 MED ORDER — MORPHINE SULFATE (CONCENTRATE) 20 MG/ML PO SOLN: 20.0000 mg | ORAL | Status: DC | PRN

## 2015-05-01 MED ORDER — SIMVASTATIN 40 MG PO TABS
40.0000 mg | ORAL_TABLET | Freq: Every evening | ORAL | Status: DC
  Administered 2015-05-01 – 2015-05-03 (×3): 40 mg via ORAL
  Filled 2015-05-01 (×3): qty 1

## 2015-05-01 MED ORDER — DOCUSATE SODIUM 100 MG PO CAPS
100.0000 mg | ORAL_CAPSULE | Freq: Two times a day (BID) | ORAL | Status: DC
  Administered 2015-05-01 – 2015-05-04 (×7): 100 mg via ORAL
  Filled 2015-05-01 (×7): qty 1

## 2015-05-01 MED ORDER — DEXAMETHASONE SODIUM PHOSPHATE 10 MG/ML IJ SOLN
INTRAMUSCULAR | Status: DC | PRN
  Administered 2015-05-01: 10 mg via INTRAVENOUS

## 2015-05-01 MED ORDER — EPHEDRINE SULFATE 50 MG/ML IJ SOLN
INTRAMUSCULAR | Status: DC | PRN
  Administered 2015-05-01: 15 mg via INTRAVENOUS

## 2015-05-01 MED ORDER — ACETAMINOPHEN 10 MG/ML IV SOLN
INTRAVENOUS | Status: AC
  Filled 2015-05-01: qty 100

## 2015-05-01 SURGICAL SUPPLY — 50 items
BLADE SAW 1/2 (BLADE) ×3 IMPLANT
BNDG COHESIVE 6X5 TAN STRL LF (GAUZE/BANDAGES/DRESSINGS) ×6 IMPLANT
CANISTER SUCT 1200ML W/VALVE (MISCELLANEOUS) ×3 IMPLANT
CAPT HIP TOTAL 3 ×3 IMPLANT
CATH FOL LEG HOLDER (MISCELLANEOUS) ×3 IMPLANT
CATH TRAY METER 16FR LF (MISCELLANEOUS) ×3 IMPLANT
CHLORAPREP W/TINT 26ML (MISCELLANEOUS) ×3 IMPLANT
CUP MEDICINE 2OZ PLAST GRAD ST (MISCELLANEOUS) ×3 IMPLANT
DECANTER SPIKE VIAL GLASS SM (MISCELLANEOUS) ×3 IMPLANT
DRAPE C-ARM XRAY 36X54 (DRAPES) ×3 IMPLANT
DRAPE C-SECTION (MISCELLANEOUS) ×3 IMPLANT
DRAPE INCISE IOBAN 66X60 STRL (DRAPES) IMPLANT
DRAPE POUCH INSTRU U-SHP 10X18 (DRAPES) ×3 IMPLANT
DRAPE SHEET LG 3/4 BI-LAMINATE (DRAPES) ×9 IMPLANT
DRAPE STERI IOBAN 125X83 (DRAPES) ×3 IMPLANT
DRAPE TABLE BACK 80X90 (DRAPES) ×3 IMPLANT
DRSG OPSITE POSTOP 4X8 (GAUZE/BANDAGES/DRESSINGS) ×6 IMPLANT
ELECT BLADE 6.5 EXT (BLADE) ×3 IMPLANT
GAUZE SPONGE 4X4 12PLY STRL (GAUZE/BANDAGES/DRESSINGS) ×3 IMPLANT
GLOVE BIOGEL PI IND STRL 9 (GLOVE) ×1 IMPLANT
GLOVE BIOGEL PI INDICATOR 9 (GLOVE) ×2
GLOVE SURG ORTHO 9.0 STRL STRW (GLOVE) ×3 IMPLANT
GOWN SPECIALTY ULTRA XL (MISCELLANEOUS) ×3 IMPLANT
GOWN STRL REUS W/ TWL LRG LVL3 (GOWN DISPOSABLE) ×1 IMPLANT
GOWN STRL REUS W/TWL LRG LVL3 (GOWN DISPOSABLE) ×2
HEMOVAC 400CC 10FR (MISCELLANEOUS) ×3 IMPLANT
HOOD PEEL AWAY FACE SHEILD DIS (HOOD) ×3 IMPLANT
MAT BLUE FLOOR 46X72 FLO (MISCELLANEOUS) ×3 IMPLANT
NDL SAFETY 18GX1.5 (NEEDLE) ×3 IMPLANT
NEEDLE HYPO 25X1 1.5 SAFETY (NEEDLE) ×3 IMPLANT
NEEDLE SPNL 18GX3.5 QUINCKE PK (NEEDLE) ×3 IMPLANT
NS IRRIG 1000ML POUR BTL (IV SOLUTION) ×3 IMPLANT
PACK HIP COMPR (MISCELLANEOUS) ×3 IMPLANT
SOL PREP PVP 2OZ (MISCELLANEOUS) ×3
SOLUTION PREP PVP 2OZ (MISCELLANEOUS) ×1 IMPLANT
STAPLER SKIN PROX 35W (STAPLE) ×3 IMPLANT
STRAP SAFETY BODY (MISCELLANEOUS) ×3 IMPLANT
SUT DVC 2 QUILL PDO  T11 36X36 (SUTURE) ×2
SUT DVC 2 QUILL PDO T11 36X36 (SUTURE) ×1 IMPLANT
SUT DVC QUILL MONODERM 30X30 (SUTURE) ×3 IMPLANT
SUT ETHIBOND NAB CT1 #1 30IN (SUTURE) ×3 IMPLANT
SUT SILK 0 (SUTURE) ×2
SUT SILK 0 30XBRD TIE 6 (SUTURE) ×1 IMPLANT
SUT VIC AB 1 CT1 36 (SUTURE) ×3 IMPLANT
SYR 20CC LL (SYRINGE) ×3 IMPLANT
SYR 30ML LL (SYRINGE) ×3 IMPLANT
SYRINGE 10CC LL (SYRINGE) ×3 IMPLANT
TAPE MICROFOAM 4IN (TAPE) ×3 IMPLANT
TUBE KAMVAC SUCTION (TUBING) ×3 IMPLANT
WATER STERILE IRR 1000ML POUR (IV SOLUTION) ×3 IMPLANT

## 2015-05-01 NOTE — H&P (Signed)
Reviewed paper H+P, will be scanned into chart. No changes noted.  

## 2015-05-01 NOTE — Anesthesia Preprocedure Evaluation (Addendum)
Anesthesia Evaluation  Patient identified by MRN, date of birth, ID band Patient awake    History of Anesthesia Complications (+) PONV and Family history of anesthesia reaction  Airway Mallampati: III  TM Distance: >3 FB     Dental  (+) Poor Dentition, Chipped, Loose   Pulmonary Current Smoker,    Pulmonary exam normal breath sounds clear to auscultation       Cardiovascular hypertension, Pt. on medications + Past MI  Normal cardiovascular exam     Neuro/Psych Anxiety Depression negative neurological ROS     GI/Hepatic negative GI ROS, Neg liver ROS,   Endo/Other  diabetes, Well Controlled, Type 2  Renal/GU Renal disease  negative genitourinary   Musculoskeletal negative musculoskeletal ROS (+)   Abdominal Normal abdominal exam  (+)   Peds negative pediatric ROS (+)  Hematology negative hematology ROS (+)   Anesthesia Other Findings   Reproductive/Obstetrics                           Anesthesia Physical Anesthesia Plan  ASA: III  Anesthesia Plan: General   Post-op Pain Management:    Induction: Intravenous  Airway Management Planned: Oral ETT  Additional Equipment:   Intra-op Plan:   Post-operative Plan: Extubation in OR  Informed Consent: I have reviewed the patients History and Physical, chart, labs and discussed the procedure including the risks, benefits and alternatives for the proposed anesthesia with the patient or authorized representative who has indicated his/her understanding and acceptance.   Dental advisory given  Plan Discussed with: CRNA and Surgeon  Anesthesia Plan Comments: (Patient prefers GOT...decided against  Regional block)       Anesthesia Quick Evaluation

## 2015-05-01 NOTE — Op Note (Signed)
05/01/2015  12:14 PM  PATIENT:  Sean Petersen  59 y.o. male  PRE-OPERATIVE DIAGNOSIS:  DEGENERATIVE OSTEOARTHRITIS primary osteoarthritis right hip  POST-OPERATIVE DIAGNOSIS:  same   PROCEDURE:  Procedure(s): TOTAL HIP ARTHROPLASTY ANTERIOR APPROACH (Right)  SURGEON: Laurene Footman, MD  ASSISTANTS: None  ANESTHESIA:   general  EBL:  Total I/O In: -  Out: 800 [Urine:300; Blood:500]  BLOOD ADMINISTERED:none  DRAINS: none   LOCAL MEDICATIONS USED:  MARCAINE     SPECIMEN:  Source of Specimen:  Right femoral head  DISPOSITION OF SPECIMEN:  PATHOLOGY  COUNTS:  YES  TOURNIQUET:  * No tourniquets in log *  IMPLANTS: Medacta AMIS size 4 standard stem with 54 mm Mpact cup DM with S 28 mm head  DICTATION: .Dragon Dictation   The patient was brought to the operating room and after spinal anesthesia was obtained patient was placed on the operative table with the ipsilateral foot into the Medacta attachment, contralateral leg on a well-padded table. C-arm was brought in and preop template x-Vasseur taken. After prepping and draping in usual sterile fashion appropriate patient identification and timeout procedures were completed. Anterior approach to the hip was obtained and centered over the greater trochanter and TFL muscle. The subcutaneous tissue was incised hemostasis being achieved by electrocautery. TFL fascia was incised and the muscle retracted laterally deep retractor placed. The lateral femoral circumflex vessels were identified and ligated. The anterior capsule was exposed and a capsulotomy performed. The neck was identified and a femoral neck cut carried out with a saw. The head was removed without difficulty and showed sclerotic femoral head and acetabulum. Reaming was carried out to 52 mm and a 54 mm cup trial gave appropriate tightness to the acetabular component a 35 Mpact DM cup was impacted into position. The leg was then externally rotated and ischiofemoral and pubofemoral  releases carried out. The femur was sequentially broached to a size 4, size 4 stem and standard neck with S head trials were placed and the final components chosen. The 4 standard stem was inserted along with a S 28 mm head and 54 mm liner. The hip was reduced and was stable the wound was thoroughly irrigated with a dilute Betadine solution. The deep fascia was closed using a heavy Quill after infiltration of 30 cc of quarter percent Sensorcaine with epinephrine. TXA was injected into the joint the wound was then closed after the deep fascia with 20 Quill subcutaneously skin staples covered with Xeroform and honeycomb dressing   PLAN OF CARE: Admit to inpatient

## 2015-05-01 NOTE — Progress Notes (Signed)
Physical Therapy Treatment Patient Details Name: Sean Petersen MRN: [id number] DOB: February 21, 1956 Today's Date: 05/01/2015    History of Present Illness admitted for acute hospitalization status post elective R THR (anterior approach, Dr. Rudene Christians) 05/01/15; WBAT.    PT Comments    Upon evaluation, patient alert and oriented, follows all commands and demonstrates good insight/safety awareness.  Exhibits good post-op strength and ROM to R LE (at least 3-/5 throughout hip) with full sensory awareness throughout extremity.  Fair pain control, rating pain 7-8/10 at rest and with mobility.  Currently able to complete bed mobility with mod indep; sit/stand, basic transfers and short-distance gait (10') with RW, cga/min assist.  Fair/good weight acceptance to R LE without buckling or LOB noted. Would benefit from skilled PT to address above deficits and promote optimal return to PLOF; Recommend transition to [facility] upon discharge from acute hospitalization.    Follow Up Recommendations  Home health PT     Equipment Recommendations       Recommendations for Other Services       Precautions / Restrictions Precautions Precautions: Fall Restrictions Weight Bearing Restrictions: Yes RLE Weight Bearing: Weight bearing as tolerated    Mobility  Bed Mobility Overal bed mobility: Modified Independent                Transfers Overall transfer level: Needs assistance Equipment used: Rolling walker (2 wheeled) Transfers: Sit to/from Stand Sit to Stand: Min guard         General transfer comment: cuing for hand placement for optimal safety; mild weight shift to L LE with movement transition  Ambulation/Gait Ambulation/Gait assistance: Min guard Ambulation Distance (Feet): 10 Feet Assistive device: Rolling walker (2 wheeled)       General Gait Details: step to gait pattern with decreased stance time/weight acceptance to R LE; min cuing for walker position   Stairs             Wheelchair Mobility    Modified Rankin (Stroke Patients Only)       Balance Overall balance assessment: Needs assistance Sitting-balance support: No upper extremity supported;Feet supported Sitting balance-Leahy Scale: Good     Standing balance support: Bilateral upper extremity supported Standing balance-Leahy Scale: Fair                      Cognition Arousal/Alertness: Awake/alert Behavior During Therapy: WFL for tasks assessed/performed Overall Cognitive Status: Within Functional Limits for tasks assessed                      Exercises Other Exercises Other Exercises: Supine R LE therex, 1x10, AROM for muscular strength/endurance with functional activities.  Good isolated strength and tolerance for ROM post-op.    General Comments        Pertinent Vitals/Pain Pain Assessment: 0-10 Pain Score: 7  Pain Location: R hip Pain Descriptors / Indicators: Aching Pain Intervention(s): Limited activity within patient's tolerance;Monitored during session;Repositioned    Home Living Family/patient expects to be discharged to:: Private residence Living Arrangements: Spouse/significant other Available Help at Discharge: Family (wife has MS, unable to provide extensive assist to patient) Type of Home: House Home Access: Stairs to enter Entrance Stairs-Rails: Can reach both Home Layout: One level Home Equipment: Environmental consultant - 2 wheels;Cane - single point      Prior Function Level of Independence: Independent with assistive device(s)      Comments: Intermittent use of RW vs. SPC in recent weeks/months due to worsening R hip  pain; otherwise, indep with ADLs and household activities.   PT Goals (current goals can now be found in the care plan section) Acute Rehab PT Goals Patient Stated Goal: "to go home as soon as I can" PT Goal Formulation: With patient Time For Goal Achievement: 05/15/15 Potential to Achieve Goals: Good    Frequency  BID    PT Plan       Co-evaluation             End of Session Equipment Utilized During Treatment: Gait belt Activity Tolerance: Patient tolerated treatment well Patient left: in chair;with call bell/phone within reach;with chair alarm set     Time: [id number] PT Time Calculation (min) (ACUTE ONLY): 22 min  Charges:  $Therapeutic Exercise: 8-22 mins                    G Codes:       Salathiel Ferrara H. Owens Shark, PT, DPT, NCS 05/01/2015, 4:47 PM [phone]

## 2015-05-01 NOTE — Progress Notes (Addendum)
Pt transferred to floor from OR. Pt is alert and oriented x 4. He is complaining of "50/10 cramping" in his right leg. MD is aware, a muscle relaxer has been ordered. Surgical site is covered with honeycomb dressing and it is clean, dry, and intact. VSS: BP 117/63 mmHg  Pulse 55  Temp(Src) 97.5 F (36.4 C) (Oral)  Resp 18  Ht 5\' 8"  (1.727 m)  Wt 99.8 kg (220 lb 0.3 oz)  BMI 33.46 kg/m2  SpO2 100%. Patient resting in bedside with family in room.

## 2015-05-01 NOTE — Transfer of Care (Signed)
Immediate Anesthesia Transfer of Care Note  Patient: Sean Petersen  Procedure(s) Performed: Procedure(s): TOTAL HIP ARTHROPLASTY ANTERIOR APPROACH (Right)  Patient Location: PACU  Anesthesia Type:General  Level of Consciousness: awake and sedated  Airway & Oxygen Therapy: Patient Spontanous Breathing and Patient connected to face mask oxygen  Post-op Assessment: Report given to RN and Post -op Vital signs reviewed and stable  Post vital signs: Reviewed and stable  Last Vitals:  Filed Vitals:   05/01/15 0848  BP: 121/71  Pulse: 55  Temp: 36.9 C  Resp: 16    Complications: No apparent anesthesia complications

## 2015-05-01 NOTE — Anesthesia Procedure Notes (Signed)
Procedure Name: Intubation Date/Time: 05/01/2015 10:00 AM Performed by: Nelda Marseille Pre-anesthesia Checklist: Patient identified, Patient being monitored, Timeout performed, Emergency Drugs available and Suction available Patient Re-evaluated:Patient Re-evaluated prior to inductionOxygen Delivery Method: Circle System Utilized and Circle system utilized Preoxygenation: Pre-oxygenation with 100% oxygen Intubation Type: IV induction Ventilation: Mask ventilation without difficulty Laryngoscope Size: Mac and 3 Grade View: Grade II Tube type: Oral Tube size: 7.5 mm Number of attempts: 2 Airway Equipment and Method: Stylet and Bougie stylet Placement Confirmation: ETT inserted through vocal cords under direct vision,  positive ETCO2 and breath sounds checked- equal and bilateral Secured at: 22 cm Tube secured with: Tape Dental Injury: Teeth and Oropharynx as per pre-operative assessment

## 2015-05-02 LAB — GLUCOSE, CAPILLARY
GLUCOSE-CAPILLARY: 265 mg/dL — AB (ref 65–99)
GLUCOSE-CAPILLARY: 235 mg/dL — AB (ref 65–99)
GLUCOSE-CAPILLARY: 217 mg/dL — AB (ref 65–99)
Glucose-Capillary: 179 mg/dL — ABNORMAL HIGH (ref 65–99)

## 2015-05-02 NOTE — Progress Notes (Signed)
Physical Therapy Treatment Patient Details Name: Sean Petersen MRN: [id number] DOB: 1956/03/31 Today's Date: 05/02/2015    History of Present Illness admitted for acute hospitalization status post elective R THR (anterior approach, Dr. Rudene Christians) 05/01/15; WBAT.    PT Comments    Able to initiate stair training, completing up/down 8 with bilat rails, cga; min cuing for technique.  Good R LE stability in closed-chain WBing positions.  Follow Up Recommendations  Home health PT     Equipment Recommendations       Recommendations for Other Services       Precautions / Restrictions Precautions Precautions: Fall Restrictions Weight Bearing Restrictions: Yes RLE Weight Bearing: Weight bearing as tolerated    Mobility  Bed Mobility Overal bed mobility: Modified Independent Bed Mobility: Sit to Supine       Sit to supine: Modified independent (Device/Increase time)   General bed mobility comments: self assisting R LE over edge of bed  Transfers Overall transfer level: Needs assistance Equipment used: Rolling walker (2 wheeled) Transfers: Sit to/from Stand Sit to Stand: Supervision            Ambulation/Gait Ambulation/Gait assistance: Min guard;Supervision Ambulation Distance (Feet): 240 Feet Assistive device: Rolling walker (2 wheeled)       General Gait Details: progressive increase in gait symmetry and overall fluidity; good weight acceptance R LE with good hip/knee control noted.   Stairs Stairs: Yes Stairs assistance: Min guard Stair Management: Two rails Number of Stairs: 8 General stair comments: step to gait pattern, min cuing for technique  Wheelchair Mobility    Modified Rankin (Stroke Patients Only)       Balance                                    Cognition Arousal/Alertness: Awake/alert Behavior During Therapy: WFL for tasks assessed/performed Overall Cognitive Status: Within Functional Limits for tasks assessed                       Exercises Other Exercises Other Exercises: Standing LE therex, 1x10, AROM with RW, cga/close sup: heel raises, mini squats, marching; all performed within patient-selected, tolerable range Other Exercises: Able to don pants with sup; good demonstration of technique learned in OT this AM. Other Exercises: Verbally reviewed car transfer technique for use upon discharge; patient voiced understanding.    General Comments        Pertinent Vitals/Pain Pain Assessment: Faces Faces Pain Scale: Hurts even more Pain Location: reports pain more in "R femur and knee" than R hip Pain Descriptors / Indicators: Aching Pain Intervention(s): Limited activity within patient's tolerance;Monitored during session;Repositioned    Home Living                      Prior Function            PT Goals (current goals can now be found in the care plan section) Acute Rehab PT Goals Patient Stated Goal: To go home PT Goal Formulation: With patient Time For Goal Achievement: 05/15/15 Potential to Achieve Goals: Good Progress towards PT goals: Progressing toward goals    Frequency  BID    PT Plan Current plan remains appropriate    Co-evaluation             End of Session Equipment Utilized During Treatment: Gait belt Activity Tolerance: Patient tolerated treatment well Patient left: in bed;with  call bell/phone within reach;with bed alarm set     Time: 1346-1410 PT Time Calculation (min) (ACUTE ONLY): 24 min  Charges:  $Gait Training: 8-22 mins $Therapeutic Exercise: 8-22 mins                    G Codes:      Clarie Camey H. Owens Shark, PT, DPT, NCS 05/02/2015, 4:34 PM [phone]

## 2015-05-02 NOTE — Evaluation (Signed)
Occupational Therapy Evaluation Patient Details Name: Sean Petersen MRN: [id number] DOB: 05/14/1956 Today's Date: 05/02/2015    History of Present Illness admitted for acute hospitalization status post elective R THR (anterior approach, Dr. Rudene Christians) 05/01/15; WBAT.   Clinical Impression   This patient is a 59 year old male who came to [facility] for a R total hip replacement (anterior approach).  Patient lives in a  home with his wife steps and had been independent with ADL and functional mobility and worked full time until recently. He now requires some assistance and would benefit from Occupational Therapy for ADL/functional mobility training while .staying within hip precautions (anterior approach).      Follow Up Recommendations       Equipment Recommendations       Recommendations for Other Services       Precautions / Restrictions Precautions Precautions: Fall Restrictions Weight Bearing Restrictions: Yes RLE Weight Bearing: Weight bearing as tolerated      Mobility Bed Mobility Overal bed mobility: Modified Independent                Transfers Overall transfer level: Needs assistance Equipment used: Rolling walker (2 wheeled) Transfers: Sit to/from Stand Sit to Stand: Min guard;Supervision         General transfer comment: cuing for symmetrical stance bilat LEs as tolerated    Balance Overall balance assessment: Needs assistance Sitting-balance support: No upper extremity supported;Feet supported Sitting balance-Leahy Scale: Good     Standing balance support: Bilateral upper extremity supported Standing balance-Leahy Scale: Good                              ADL                                         General ADL Comments: Had been independent. He had been working until recently. Patient practiced techniques for lower body dressing he was able to put pants on and take socks off with out  assistive devices but needed sock aid to put on right sock only. He needed minimal assist and cues for technique and safety.     Vision     Perception     Praxis      Pertinent Vitals/Pain Pain Score: 10-Worst pain ever (nursing in room and can give meds in 30 min) Pain Location: R hip Pain Descriptors / Indicators: Aching Pain Intervention(s): Limited activity within patient's tolerance;Monitored during session;Repositioned     Hand Dominance     Extremity/Trunk Assessment Upper Extremity Assessment Upper Extremity Assessment: Overall WFL for tasks assessed   Lower Extremity Assessment Lower Extremity Assessment: Defer to PT evaluation       Communication Communication Communication: No difficulties   Cognition Arousal/Alertness: Awake/alert Behavior During Therapy: Impulsive Overall Cognitive Status: Within Functional Limits for tasks assessed                     General Comments       Exercises   Other Exercises Other Exercises: Seated LE therex, 1x15, AROM for muscular strength/endurance: ankle pumps, LAQs, marching, hip abduct/adduct. Other Exercises: Sit/stand with RW, 1x5, cga/close sup; cuing for symmetrical WBing bilat LEs, requires UE support to complete   Shoulder Instructions      Home Living Family/patient expects to be discharged to:: Private residence Living  Arrangements: Spouse/significant other Available Help at Discharge: Family (wife has MS and can't help much) Type of Home: House Home Access: Stairs to enter [employer] of Steps: 8 Entrance Stairs-Rails: Can reach both Home Layout: One level               Home Equipment: Walker - 2 wheels;Cane - single point          Prior Functioning/Environment Level of Independence: Independent with assistive device(s)        Comments: used rolling walker or single point cane.    OT Diagnosis: Acute pain   OT Problem List:     OT Treatment/Interventions:  Self-care/ADL training    OT Goals(Current goals can be found in the care plan section) Acute Rehab OT Goals Patient Stated Goal: To go home OT Goal Formulation: With patient Time For Goal Achievement: 05/16/15 Potential to Achieve Goals: Good  OT Frequency: Min 1X/week   Barriers to D/C:            Co-evaluation              End of Session Equipment Utilized During Treatment:  (hip kit)  Activity Tolerance:   Patient left: in chair;with call bell/phone within reach;with chair alarm set;with nursing/sitter in room   Time: 1145-1201 OT Time Calculation (min): 16 min Charges:  OT General Charges $OT Visit: 1 Procedure OT Evaluation $Initial OT Evaluation Tier I: 1 Procedure G-Codes:    Myrene Galas, MS/OTR/L   05/02/2015, 12:05 PM

## 2015-05-02 NOTE — Progress Notes (Signed)
Physical Therapy Treatment Patient Details Name: Sean Petersen MRN: [id number] DOB: 09/19/1955 Today's Date: 05/02/2015    History of Present Illness admitted for acute hospitalization status post elective R THR (anterior approach, Dr. Rudene Christians) 05/01/15; WBAT.    PT Comments    Patient with expected progression in mobility this date, completing all transfers/gait with RW, cga/close sup.  Fair/good weight acceptance to R LE with good stability noted. Decreased gait speed compared to age-matched norms; however, steady and without safety concern throughout distance. Patient very eager/motivated with all therapeutic interventions.   Follow Up Recommendations  Home health PT     Equipment Recommendations       Recommendations for Other Services       Precautions / Restrictions Precautions Precautions: Fall Restrictions Weight Bearing Restrictions: Yes RLE Weight Bearing: Weight bearing as tolerated    Mobility  Bed Mobility Overal bed mobility: Modified Independent                Transfers Overall transfer level: Needs assistance Equipment used: Rolling walker (2 wheeled) Transfers: Sit to/from Stand Sit to Stand: Min guard;Supervision         General transfer comment: cuing for symmetrical stance bilat LEs as tolerated  Ambulation/Gait Ambulation/Gait assistance: Min guard Ambulation Distance (Feet): 220 Feet Assistive device: Rolling walker (2 wheeled)   Gait velocity: 10' walk time, 7 seconds Gait velocity interpretation: <1.8 ft/sec, indicative of risk for recurrent falls General Gait Details: reciprocal stepping pattern with fair/good weight acceptance to R LE; no buckling/LOB.  Fair cadence/gait speed   Financial trader Rankin (Stroke Patients Only)       Balance Overall balance assessment: Needs assistance Sitting-balance support: No upper extremity supported;Feet supported Sitting balance-Leahy Scale:  Good     Standing balance support: Bilateral upper extremity supported Standing balance-Leahy Scale: Good                      Cognition Arousal/Alertness: Awake/alert Behavior During Therapy: WFL for tasks assessed/performed Overall Cognitive Status: Within Functional Limits for tasks assessed                      Exercises Other Exercises Other Exercises: Seated LE therex, 1x15, AROM for muscular strength/endurance: ankle pumps, LAQs, marching, hip abduct/adduct. Other Exercises: Sit/stand with RW, 1x5, cga/close sup; cuing for symmetrical WBing bilat LEs, requires UE support to complete    General Comments        Pertinent Vitals/Pain Pain Score: 8  Pain Location: R hip Pain Descriptors / Indicators: Aching Pain Intervention(s): Limited activity within patient's tolerance;Monitored during session;Repositioned    Home Living                      Prior Function            PT Goals (current goals can now be found in the care plan section) Acute Rehab PT Goals Patient Stated Goal: "to go home as soon as I can" PT Goal Formulation: With patient Time For Goal Achievement: 05/15/15 Potential to Achieve Goals: Good Progress towards PT goals: Progressing toward goals    Frequency  BID    PT Plan Current plan remains appropriate    Co-evaluation             End of Session Equipment Utilized During Treatment: Gait belt Activity Tolerance: Patient tolerated treatment well Patient  left: in chair;with call bell/phone within reach;with chair alarm set     Time: [phone] PT Time Calculation (min) (ACUTE ONLY): 23 min  Charges:  $Gait Training: 8-22 mins $Therapeutic Exercise: 8-22 mins                    G Codes:      Sean Petersen, PT, DPT, NCS 05/02/2015, 10:17 AM [phone]

## 2015-05-02 NOTE — Progress Notes (Signed)
   Subjective: 1 Day Post-Op Procedure(s) (LRB): TOTAL HIP ARTHROPLASTY ANTERIOR APPROACH (Right) Patient reports pain as 9 on 0-10 scale.   Patient is well, and has had no acute complaints or problems We will start therapy today.  Plan is to go Home after hospital stay. Patient afraid to take oxycodone due to history of nausea with oxycodone  Objective: Vital signs in last 24 hours: Temp:  [97.4 F (36.3 C)-98.9 F (37.2 C)] 98.3 F (36.8 C) (12/16 0738) Pulse Rate:  [55-70] 69 (12/16 0738) Resp:  [12-20] 18 (12/16 0738) BP: (110-153)/(56-78) 153/78 mmHg (12/16 0738) SpO2:  [88 %-100 %] 100 % (12/16 0738) FiO2 (%):  [28 %] 28 % (12/15 1353) Weight:  [99.8 kg (220 lb 0.3 oz)] 99.8 kg (220 lb 0.3 oz) (12/15 1356)  Intake/Output from previous day: 12/15 0701 - 12/16 0700 In: 2180 [P.O.:480; I.V.:1700] Out: 5300 [Urine:4800; Blood:500] Intake/Output this shift:     Recent Labs  05/01/15 1659  HGB 12.1*    Recent Labs  05/01/15 1659  WBC 12.3*  RBC 3.75*  HCT 36.5*  PLT 184    Recent Labs  05/01/15 1659  CREATININE 1.22   No results for input(s): LABPT, INR in the last 72 hours.  EXAM General - Patient is Alert, Appropriate and Oriented Extremity - Neurovascular intact Sensation intact distally Intact pulses distally Dorsiflexion/Plantar flexion intact Dressing - dressing C/D/I and no drainage Motor Function - intact, moving foot and toes well on exam.   Past Medical History  Diagnosis Date  . Type II or unspecified type diabetes mellitus without mention of complication, uncontrolled   . Other and unspecified hyperlipidemia   . Essential hypertension, benign   . Myocardial infarction ([place])   . Anxiety   . Depression   . Obesity   . Varicosities   . Chronic kidney disease     stage 3  . Prostate cancer ([place])   . 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 PONV  (postoperative nausea and vomiting)     nausea  . Family history of adverse reaction to anesthesia     adopted    Assessment/Plan:   1 Day Post-Op Procedure(s) (LRB): TOTAL HIP ARTHROPLASTY ANTERIOR APPROACH (Right) Active Problems:   Primary osteoarthritis of right hip  Estimated body mass index is 33.46 kg/(m^2) as calculated from the following:   Height as of this encounter: 5\' 8"  (1.727 m).   Weight as of this encounter: 99.8 kg (220 lb 0.3 oz). Advance diet Up with therapy  Needs BM Recheck labs in the am Start oxycodone with zofran ODT.   DVT Prophylaxis - Lovenox, Foot Pumps and TED hose Weight-Bearing as tolerated to right leg D/C O2 and Pulse OX and try on Room Air  T. Rachelle Hora, PA-C [place] 05/02/2015, 8:16 AM

## 2015-05-02 NOTE — Care Management Note (Signed)
Case Management Note  Patient Details  Name: Sean Petersen MRN: [id number] Date of Birth: 1955-08-16  Subjective/Objective:   POD # 1 s/p right hip replacement. Patient lives at home with his wife. PT recommending home with home health PT. Presents patient a list of home care agencies. No preference. Referral to Tim with [facility] for home health PT.  Patient has a cane and walker at home. Order placed with [facility] at [facility] for 3 n 1.  Pharmacy= [facility] [phone]. Denies isues obtaining medications, copays or medical care. Sister assists with transportation.                Action/Plan:   Expected Discharge Date:  05/04/15               Expected Discharge Plan:  [place]  In-House Referral:     Discharge planning Services  CM Consult  Post Acute Care Choice:  Durable Medical Equipment, Home Health Choice offered to:  Patient  DME Arranged:  3-N-1 DME Agency:  [place]:  PT [place]:  [place]  Status of Service:  In process, will continue to follow  Medicare Important Message Given:    Date Medicare IM Given:    Medicare IM give by:    Date Additional Medicare IM Given:    Additional Medicare Important Message give by:     If discussed at [place] of Stay Meetings, dates discussed:    Additional Comments:  Jolly Mango, RN 05/02/2015, 10:59 AM

## 2015-05-02 NOTE — Progress Notes (Signed)
Inpatient Diabetes Program Recommendations  AACE/ADA: New Consensus Statement on Inpatient Glycemic Control (2015)  Target Ranges:  Prepandial:   less than 140 mg/dL      Peak postprandial:   less than 180 mg/dL (1-2 hours)      Critically ill patients:  140 - 180 mg/dL   Review of Glycemic Control  Results for Sean Petersen, Sean Petersen (MRN [id number]) as of 05/02/2015 13:07  Ref. Range 05/01/2015 08:57 05/01/2015 16:37 05/01/2015 21:18 05/02/2015 07:37 05/02/2015 11:29  Glucose-Capillary Latest Ref Range: 65-99 mg/dL 179 (H) 290 (H) 232 (H) 265 (H) 235 (H)   Diabetes history: Type 2 Outpatient Diabetes medications: Metformin 1000mg  qam Current orders for Inpatient glycemic control:Metformin 1000mg  qam, Novolog 0-15 units tid with meals  Inpatient Diabetes Program Recommendations:  CBG elevated - consider ordering an A1C;   Per ADA recommendations "consider performing an A1C on all patients with diabetes or hyperglycemia admitted to the hospital if not performed in the prior 3 months".  Consider ordering low dose basal insulin (elevated fasting blood sugar), Lantus 10 units qhs beginning tonight (0.1unit/kg).  If post prandial blood sugars remain elevated (with current Novolog correction 0-15), consider increasing Novolog to resistant correction scale, Novolog 0-20 units tid.    Gentry Fitz, RN, BA, MHA, CDE Diabetes Coordinator Inpatient Diabetes Program  [phone] (Team Pager) [phone] ([place]) 05/02/2015 1:13 PM

## 2015-05-02 NOTE — Progress Notes (Signed)
Clinical Social Worker (CSW) received SNF consult. PT is recommending home health. RN Case Manager is aware of above. Please reconsult if future social work needs arise. CSW signing off.   Iretha Kirley Morgan, LCSWA [phone] 

## 2015-05-02 NOTE — Anesthesia Postprocedure Evaluation (Signed)
Anesthesia Post Note  Patient: Sean Petersen  Procedure(s) Performed: Procedure(s) (LRB): TOTAL HIP ARTHROPLASTY ANTERIOR APPROACH (Right)  Patient location during evaluation: PACU Anesthesia Type: General Level of consciousness: awake and oriented Pain management: pain level controlled Vital Signs Assessment: post-procedure vital signs reviewed and stable Respiratory status: spontaneous breathing Cardiovascular status: blood pressure returned to baseline Anesthetic complications: no    Last Vitals:  Filed Vitals:   05/02/15 0410 05/02/15 0738  BP: 138/71 153/78  Pulse: 68 69  Temp: 36.9 C 36.8 C  Resp: 18 18    Last Pain:  Filed Vitals:   05/02/15 1049  PainSc: 6                  Novalynn Branaman

## 2015-05-02 NOTE — Progress Notes (Signed)
Patient has his home medications in his box in med room. Patient states that family member will take home tomorrow.

## 2015-05-03 LAB — CBC
HCT: 31.8 % — ABNORMAL LOW (ref 40.0–52.0)
Hemoglobin: 10.5 g/dL — ABNORMAL LOW (ref 13.0–18.0)
MCH: 32.1 pg (ref 26.0–34.0)
MCHC: 33 g/dL (ref 32.0–36.0)
MCV: 97.2 fL (ref 80.0–100.0)
PLATELETS: 156 10*3/uL (ref 150–440)
RBC: 3.27 MIL/uL — ABNORMAL LOW (ref 4.40–5.90)
RDW: 14.6 % — AB (ref 11.5–14.5)
WBC: 7.5 10*3/uL (ref 3.8–10.6)

## 2015-05-03 LAB — BASIC METABOLIC PANEL
ANION GAP: 7 (ref 5–15)
BUN: 22 mg/dL — ABNORMAL HIGH (ref 6–20)
CALCIUM: 8.2 mg/dL — AB (ref 8.9–10.3)
CO2: 24 mmol/L (ref 22–32)
CREATININE: 1.04 mg/dL (ref 0.61–1.24)
Chloride: 108 mmol/L (ref 101–111)
GLUCOSE: 168 mg/dL — AB (ref 65–99)
Potassium: 4.4 mmol/L (ref 3.5–5.1)
Sodium: 139 mmol/L (ref 135–145)

## 2015-05-03 LAB — GLUCOSE, CAPILLARY
GLUCOSE-CAPILLARY: 185 mg/dL — AB (ref 65–99)
GLUCOSE-CAPILLARY: 174 mg/dL — AB (ref 65–99)
Glucose-Capillary: 166 mg/dL — ABNORMAL HIGH (ref 65–99)
Glucose-Capillary: 135 mg/dL — ABNORMAL HIGH (ref 65–99)

## 2015-05-03 MED ORDER — ONDANSETRON HCL 4 MG PO TABS: 4.0000 mg | ORAL_TABLET | Freq: Four times a day (QID) | ORAL | Status: DC | PRN

## 2015-05-03 MED ORDER — OXYCODONE HCL 5 MG PO TABS: 5.0000 mg | ORAL_TABLET | ORAL | Status: DC | PRN

## 2015-05-03 MED ORDER — ENOXAPARIN SODIUM 40 MG/0.4ML ~~LOC~~ SOLN: 40.0000 mg | SUBCUTANEOUS | Status: DC

## 2015-05-03 NOTE — Progress Notes (Signed)
Physical Therapy Treatment Patient Details Name: JOSHAU KISHBAUGH MRN: [id number] DOB: [birth year] Today's Date: 05/03/2015    History of Present Illness admitted for acute hospitalization status post elective R THR (anterior approach, Dr. Rudene Christians) 05/01/15; WBAT.    PT Comments    Pt's chief complaint this session is R knee pain starting overnight and worse when lying down then with standing.  Educated pt on importance of maintaining ROM at knee and not to keep pillow under knee while resting in bed.  Pt most likely with referred pain from hip surgery per discussion with PA this morning.  Will continue to modify PT session to accommodate pt's increased pain.  Today's exercises limited to bed therex and ambulation on level surfaces.  Pt overall moving slower today and limited by pain.  Follow Up Recommendations  Home health PT     Equipment Recommendations       Recommendations for Other Services       Precautions / Restrictions Precautions Precautions: Fall Restrictions Weight Bearing Restrictions: Yes RLE Weight Bearing: Weight bearing as tolerated    Mobility  Bed Mobility Overal bed mobility: Modified Independent Bed Mobility: Sit to Supine       Sit to supine: Modified independent (Device/Increase time)   General bed mobility comments: verbal cues to self assist lifting leg back onto bed  Transfers Overall transfer level: Needs assistance Equipment used: Rolling walker (2 wheeled) Transfers: Sit to/from Stand Sit to Stand: Supervision            Ambulation/Gait Ambulation/Gait assistance: Supervision Ambulation Distance (Feet): 200 Feet Assistive device: Rolling walker (2 wheeled) Gait Pattern/deviations: Antalgic;Step-through pattern     General Gait Details: Decreased weight acceptance on R with increased weight bearing through RW due to R knee pain.   Stairs            Wheelchair Mobility    Modified Rankin (Stroke Patients Only)        Balance                                    Cognition Arousal/Alertness: Awake/alert Behavior During Therapy: WFL for tasks assessed/performed Overall Cognitive Status: Within Functional Limits for tasks assessed                      Exercises Total Joint Exercises Ankle Circles/Pumps: AROM;10 reps;Supine;Both Quad Sets: AROM;Both;10 reps Gluteal Sets: AROM;Both;10 reps Heel Slides: AAROM;Both;10 reps Hip ABduction/ADduction: AROM;Both;10 reps Ozias Dicenzo Arc Quad: AROM;Right;10 reps    General Comments        Pertinent Vitals/Pain Pain Assessment: 0-10 Pain Score: 9  Pain Location: R knee Pain Descriptors / Indicators: Aching (worse when lying down) Pain Intervention(s): Limited activity within patient's tolerance;Monitored during session;Ice applied    Home Living                      Prior Function            PT Goals (current goals can now be found in the care plan section) Acute Rehab PT Goals Patient Stated Goal: To go home PT Goal Formulation: With patient Time For Goal Achievement: 05/15/15 Potential to Achieve Goals: Good Progress towards PT goals: Progressing toward goals    Frequency  BID    PT Plan Current plan remains appropriate    Co-evaluation             End  of Session Equipment Utilized During Treatment: Gait belt Activity Tolerance: Patient tolerated treatment well Patient left: with call bell/phone within reach;in chair     Time: 0910-0940 PT Time Calculation (min) (ACUTE ONLY): 30 min  Charges:  $Gait Training: 8-22 mins $Therapeutic Exercise: 8-22 mins                    G Codes:      Gabreil [place] A Madeline Pho May 25, 2015, 10:04 AM

## 2015-05-03 NOTE — Progress Notes (Signed)
Subjective: 2 Days Post-Op Procedure(s) (LRB): TOTAL HIP ARTHROPLASTY ANTERIOR APPROACH (Right) Patient reports pain as 5 on 0-10 scale in the right hip.  Pt having along of knee pain with ambulation, reports it as a 9 out of 10 at it's worst.   Patient is well, and has had no acute complaints or problems Continue Physical Therapy Plan is to go Home after hospital stay. Pt not complaining of N/V with the oxycodone and zofran. Most recent Temp 99.3  Objective: Vital signs in last 24 hours: Temp:  [98.6 F (37 C)-99.4 F (37.4 C)] 99.3 F (37.4 C) (12/17 0725) Pulse Rate:  [58-65] 65 (12/17 0725) Resp:  [16-18] 16 (12/17 0725) BP: (122-139)/(68-71) 139/68 mmHg (12/17 0725) SpO2:  [92 %-97 %] 92 % (12/17 0725)  Intake/Output from previous day: 12/16 0701 - 12/17 0700 In: 600 [P.O.:600] Out: 1948 [Urine:1948] Intake/Output this shift:     Recent Labs  05/01/15 1659 05/03/15 0322  HGB 12.1* 10.5*    Recent Labs  05/01/15 1659 05/03/15 0322  WBC 12.3* 7.5  RBC 3.75* 3.27*  HCT 36.5* 31.8*  PLT 184 156    Recent Labs  05/01/15 1659 05/03/15 0322  NA  --  139  K  --  4.4  CL  --  108  CO2  --  24  BUN  --  22*  CREATININE 1.22 1.04  GLUCOSE  --  168*  CALCIUM  --  8.2*   No results for input(s): LABPT, INR in the last 72 hours.  EXAM General - Patient is Alert, Appropriate and Oriented Extremity - Neurovascular intact Sensation intact distally Intact pulses distally Dorsiflexion/Plantar flexion intact Dressing - scant drainage, bloody drainage. Motor Function - intact, moving foot and toes well on exam.   Dressing changed this morning, new honeycomb placed.  Pt denies pain with palpation of the lateral and medial joint line of right knee, negate Homan's sign bilaterally. Pt has moderate to severe pain when placing knee into flexion.  No signs of infection such as warmth or erythema.  Past Medical History  Diagnosis Date  . Type II or unspecified  type diabetes mellitus without mention of complication, uncontrolled   . Other and unspecified hyperlipidemia   . Essential hypertension, benign   . Myocardial infarction ([place])   . Anxiety   . Depression   . Obesity   . Varicosities   . Chronic kidney disease     stage 3  . Prostate cancer ([place])   . 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 PONV (postoperative nausea and vomiting)     nausea  . Family history of adverse reaction to anesthesia     adopted    Assessment/Plan:   2 Days Post-Op Procedure(s) (LRB): TOTAL HIP ARTHROPLASTY ANTERIOR APPROACH (Right) Active Problems:   Primary osteoarthritis of right hip  Estimated body mass index is 33.46 kg/(m^2) as calculated from the following:   Height as of this encounter: 5\' 8"  (1.727 m).   Weight as of this encounter: 99.8 kg (220 lb 0.3 oz). Advance diet Up with therapy  Pt has had a BM Labs reviewed.  Pt did stairs and ambulated 240 feet yesterday on POD1.  I believe his knee pain is likely due to stresses placed on his right leg with PT yesterday.  Will continue to monitor, will get up with PT today.  If continuing to have pain, will order x-Hugh of there right knee. Depending on pain level today and PT eval, can potentially discharge home today with home health PT.  DVT Prophylaxis - Lovenox, Foot Pumps and TED hose Weight-Bearing as tolerated to right leg D/C O2 and Pulse OX and try on [place], PA-C [place] 05/03/2015, 9:18 AM

## 2015-05-03 NOTE — Progress Notes (Signed)
Patient wanted iv fluids d/c'd since he is eating & drinking well. Ok per Dr. Rudene Christians.

## 2015-05-03 NOTE — Progress Notes (Addendum)
Patient refuses chair alarm & gets up on own. I educated the patient on Junior of having an assist when walking in room. He states that he "understands" but he does not want to wait for assistance to arrive, esp. If it "takes too long".  This a.m. Patient called out for nurse to come to room to "look at something". I told patient that I was with another patient and that I would be a few minutes. He gave no indication that it was urgent.  I asked him to call the na if he thought she could help. The na was also busy but he did not call me back to let me know. Later he talked to me about his frustration at having to wait to brush teeth etc. I expressed understanding that it difficult to have to wait for others esp when they want to be independent. I re-iterated that he needs to call me and that he is not a bother & that I might be able to get someone to help him if I am busy but I need to know about it. I believe I left the patient with a better understanding that he is not bothering me if he needs to call more than once. Patient still refuses alarm.

## 2015-05-03 NOTE — Progress Notes (Signed)
Physical Therapy Treatment Patient Details Name: Sean Petersen MRN: [id number] DOB: Apr 03, 1956 Today's Date: 05/03/2015    History of Present Illness admitted for acute hospitalization status post elective R THR (anterior approach, Dr. Rudene Christians) 05/01/15; WBAT.    PT Comments     Pt continues to have R knee pain but more tolerable this session.  Pt reports up and walking in room throughout the day.  Educated pt on therex to help with knee pain and to limit ambulation as to decrease aggravation to knee.  Follow Up Recommendations  Home health PT     Equipment Recommendations    RW   Recommendations for Other Services       Precautions / Restrictions Precautions Precautions: Anterior Hip;Fall Restrictions Weight Bearing Restrictions: Yes RLE Weight Bearing: Weight bearing as tolerated    Mobility  Bed Mobility                  Transfers Overall transfer level: Modified independent Equipment used: Rolling walker (2 wheeled) Transfers: Sit to/from Stand Sit to Stand: Modified independent (Device/Increase time)            Ambulation/Gait Ambulation/Gait assistance: Modified independent (Device/Increase time) Ambulation Distance (Feet): 400 Feet Assistive device: Rolling walker (2 wheeled) Gait Pattern/deviations: Step-through pattern;Antalgic     General Gait Details: Improved weight acceptance on R side with decreased dependence on UE's.   Stairs            Wheelchair Mobility    Modified Rankin (Stroke Patients Only)       Balance                                    Cognition Arousal/Alertness: Awake/alert Behavior During Therapy: WFL for tasks assessed/performed Overall Cognitive Status: Within Functional Limits for tasks assessed                      Exercises Total Joint Exercises Ankle Circles/Pumps: AROM;10 reps;Supine;Both Quad Sets: AROM;Both;10 reps Gluteal Sets: AROM;Both;10 reps Heel Slides:  AAROM;Both;10 reps Hip ABduction/ADduction: AROM;Both;10 reps Janssen Zee Arc Quad: AROM;Right;10 reps    General Comments        Pertinent Vitals/Pain Pain Assessment: 0-10 Pain Score: 6  Pain Location: R knee Pain Descriptors / Indicators: Aching Pain Intervention(s): Limited activity within patient's tolerance;Monitored during session    Home Living                      Prior Function            PT Goals (current goals can now be found in the care plan section) Acute Rehab PT Goals Patient Stated Goal: To go home PT Goal Formulation: With patient Time For Goal Achievement: 05/15/15 Potential to Achieve Goals: Good Progress towards PT goals: Progressing toward goals    Frequency  BID    PT Plan Current plan remains appropriate    Co-evaluation             End of Session Equipment Utilized During Treatment: Gait belt Activity Tolerance: Patient tolerated treatment well Patient left: with call bell/phone within reach;in chair     Time: 1510-1540 PT Time Calculation (min) (ACUTE ONLY): 30 min  Charges:  $Gait Training: 8-22 mins $Therapeutic Exercise: 8-22 mins                    G Codes:  Thurmond Hildebran A Cyprian [place] 05/03/2015, 4:14 PM

## 2015-05-04 LAB — GLUCOSE, CAPILLARY
GLUCOSE-CAPILLARY: 139 mg/dL — AB (ref 65–99)
Glucose-Capillary: 172 mg/dL — ABNORMAL HIGH (ref 65–99)
Glucose-Capillary: 163 mg/dL — ABNORMAL HIGH (ref 65–99)

## 2015-05-04 NOTE — Progress Notes (Signed)
PT DISCHARGED HOME ON  NEW LOVENOX AND SCRIPTS FOR OXYCODONE,ZOFRAN ,Ozan.I/S ON NEW MEDS . I/S ON CHG HIP DRESSING  [id number]  AND PRN . UNDERSTANDING VOICED. LEFT VIA W/C. INDEPENDENT IN LOVENOX ADM.

## 2015-05-04 NOTE — Progress Notes (Signed)
Occupational Therapy Treatment Patient Details Name: Sean Petersen MRN: [id number] DOB: [birth year] Today's Date: 05/04/2015    History of present illness admitted for acute hospitalization status post elective R THR (anterior approach, Dr. Rudene Christians) 05/01/15; WBAT.   OT comments  Pt is making good progress with ADLs and less impulsive during session but continues to need reminders to not get up on his own to go to bathroom.  NSG and PT have also been educating him on this as well.  He was able to use sock aid independently and reacher for LB dressing.  Also given green theraband to help move RLE when getting out of bed since he stated taht was the most painful and difficult for him and his wife will not be able to help since she has MS.  Reviewed adaptive equipment catalog in prep for DC as to where to find AD rec.  No further OT needed and all goals met.  Follow Up Recommendations  No OT follow up    Equipment Recommendations   (rec reacher and sock aid)    Recommendations for Other Services      Precautions / Restrictions Precautions Precautions: Fall;Anterior Hip Restrictions Weight Bearing Restrictions: Yes RLE Weight Bearing: Weight bearing as tolerated       Mobility Bed Mobility                  Transfers                      Balance                                   ADL                                         General ADL Comments: Pt seen for ADL training with reacher and sock aid and is making good progress with AD and given theraband to help raise RLE out of bed since this is difficult and increases pain.  Pt educated on where to puchase AD and reviewed catalog for an option to order as well.        Vision                     Perception     Praxis      Cognition   Behavior During Therapy: WFL for tasks assessed/performed Overall Cognitive Status: Within Functional Limits for tasks assessed                        Extremity/Trunk Assessment               Exercises     Shoulder Instructions       General Comments      Pertinent Vitals/ Pain       Pain Assessment: 0-10 Pain Score: 10-Worst pain ever Pain Location: R knee and hip Pain Descriptors / Indicators: Aching;Crushing;Constant Pain Intervention(s): Limited activity within patient's tolerance;Monitored during session;Premedicated before session  Home Living                                          Prior Functioning/Environment  Frequency Min 1X/week     Progress Toward Goals  OT Goals(current goals can now be found in the care plan section)  Progress towards OT goals: Progressing toward goals  Acute Rehab OT Goals Patient Stated Goal: To go home OT Goal Formulation: With patient Time For Goal Achievement: 05/16/15 Potential to Achieve Goals: Good  Plan Discharge plan remains appropriate    Co-evaluation                 End of Session Equipment Utilized During Treatment:  (reach and sock aid)   Activity Tolerance Patient tolerated treatment well   Patient Left in chair;with call bell/phone within reach;with chair alarm set   Nurse Communication          Time: [phone] OT Time Calculation (min): 30 min  Charges: OT General Charges $OT Visit: 1 Procedure OT Treatments $Self Care/Home Management : 23-37 mins  Boston Catarino 05/04/2015, 11:11 AM    Chrys Racer, OTR/L ascom [phone]

## 2015-05-04 NOTE — Discharge Summary (Signed)
Physician Discharge Summary  Patient ID: Sean Petersen MRN: [id number] DOB/AGE: 02/02/1956 59 y.o.  Admit date: 05/01/2015 Discharge date: 05/04/2015  Admission Diagnoses:  [place] primary osteoarthritis right hip   Discharge Diagnoses: Patient Active Problem List   Diagnosis Date Noted  . Primary osteoarthritis of right hip 05/01/2015  . 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) 03/07/2015  . Malignant neoplasm of prostate ([place]) 02/23/2013  DEGENERATIVE OSTEOARTHRITIS primary osteoarthritis right hip  Past Medical History  Diagnosis Date  . Type II or unspecified type diabetes mellitus without mention of complication, uncontrolled   . Other and unspecified hyperlipidemia   . Essential hypertension, benign   . Myocardial infarction ([place] [place])   . Anxiety   . Depression   . Obesity   . Varicosities   . Chronic kidney disease     stage 3  . Prostate cancer ([place])   . Cancer [facility])     states he had cancer in his left forearm but is unaware of type and was told his body took care of it  . PONV (postoperative nausea and vomiting)     nausea  . Family history of adverse reaction to anesthesia     adopted     Transfusion: none   Consultants (if any):    Discharged Condition: Improved  Hospital Course: Sean Petersen is an 59 y.o. male who was admitted 05/01/2015 with a diagnosis of DEGENERATIVE OSTEOARTHRITIS primary osteoarthritis right hip and went to the operating room on 05/01/2015 and underwent the above named procedures.    Surgeries: Procedure(s): TOTAL HIP ARTHROPLASTY ANTERIOR APPROACH on 05/01/2015 Patient tolerated the surgery well. Taken to PACU where she was stabilized and then transferred to the orthopedic floor.  Started on Lovenox 40mg  q 24 hrs. Foot pumps applied bilaterally at 80 mm. Heels elevated on bed with rolled towels. No evidence of DVT. Negative Homan. Physical therapy started on  day #1 for gait training and transfer. OT started day #1 for ADL and assisted devices.  Patient's IV and Foley were d/c on POD1  Implants: Medacta AMIS size 4 standard stem with 54 mm Mpact cup DM with S 28 mm head  He was given perioperative antibiotics:  Anti-infectives    Start     Dose/Rate Route Frequency Ordered Stop   05/01/15 1400  ceFAZolin (ANCEF) IVPB 2 g/50 mL premix     2 g 100 mL/hr over 30 Minutes Intravenous Every 6 hours 05/01/15 1352 05/02/15 0410   05/01/15 0905  ceFAZolin (ANCEF) 2-3 GM-% IVPB SOLR    Comments:  Rexanne Mano: cabinet override      05/01/15 0905 05/01/15 1030   05/01/15 0845  ceFAZolin (ANCEF) IVPB 2 g/50 mL premix  Status:  Discontinued     2 g 100 mL/hr over 30 Minutes Intravenous  Once 05/01/15 0842 05/01/15 1352    .  He was given sequential compression devices, early ambulation, and Lovenox for DVT prophylaxis.  He benefited maximally from the hospital stay and there were no complications.    Recent vital signs:  Filed Vitals:   05/04/15 0534 05/04/15 0727  BP: 127/65 133/69  Pulse: 63 59  Temp: 98.9 F (37.2 C) 99.3 F (37.4 C)  Resp: 7 17    Recent laboratory studies:  Lab Results  Component Value Date   HGB 10.5* 05/03/2015   HGB 12.1* 05/01/2015   HGB 13.1 04/23/2015   Lab Results  Component Value Date   WBC 7.5 05/03/2015  PLT 156 05/03/2015   Lab Results  Component Value Date   INR 0.99 04/23/2015   Lab Results  Component Value Date   NA 139 05/03/2015   K 4.4 05/03/2015   CL 108 05/03/2015   CO2 24 05/03/2015   BUN 22* 05/03/2015   CREATININE 1.04 05/03/2015   GLUCOSE 168* 05/03/2015    Discharge Medications:     Medication List    TAKE these medications        ALPRAZolam 0.5 MG tablet  Commonly known as:  XANAX  Take 0.5 mg by mouth 2 (two) times daily as needed.     amLODipine 10 MG tablet  Commonly known as:  NORVASC  Take 10 mg by mouth daily.     ANUCORT-HC 25 MG suppository   Generic drug:  hydrocortisone  Place 25 mg rectally daily as needed.     atenolol 100 MG tablet  Commonly known as:  TENORMIN  Take 100 mg by mouth daily.     dicyclomine 10 MG capsule  Commonly known as:  BENTYL  Take 10 mg by mouth 4 (four) times daily -  before meals and at bedtime.     enoxaparin 40 MG/0.4ML injection  Commonly known as:  LOVENOX  Inject 0.4 mLs (40 mg total) into the skin daily.     gabapentin 100 MG capsule  Commonly known as:  NEURONTIN  Take by mouth.     isosorbide mononitrate 30 MG 24 hr tablet  Commonly known as:  IMDUR  Take 30 mg by mouth daily.     lisinopril 20 MG tablet  Commonly known as:  PRINIVIL,ZESTRIL  Take 20 mg by mouth daily.     MEGA MULTI MEN PO  Take 1 tablet by mouth daily.     metFORMIN 1000 MG tablet  Commonly known as:  GLUCOPHAGE  Take 1,000 mg by mouth daily with breakfast.     morphine 20 MG/ML concentrated solution  Commonly known as:  ROXANOL  Take 1 mL (20 mg total) by mouth every 2 (two) hours as needed for severe pain (Take 0.5 - 1 mL (10 - 20 mg) once every 6-8 hours as needed for pain).     ondansetron 4 MG tablet  Commonly known as:  ZOFRAN  Take 1 tablet (4 mg total) by mouth every 6 (six) hours as needed for nausea.     oxyCODONE 5 MG immediate release tablet  Commonly known as:  Oxy IR/ROXICODONE  Take 1-2 tablets (5-10 mg total) by mouth every 4 (four) hours as needed for breakthrough pain.     predniSONE 5 MG tablet  Commonly known as:  DELTASONE  Take 1 tablet (5 mg total) by mouth 2 (two) times daily with a meal.     sertraline 25 MG tablet  Commonly known as:  ZOLOFT  Take 25 mg by mouth daily.     simvastatin 40 MG tablet  Commonly known as:  ZOCOR  Take 40 mg by mouth every evening.     tamsulosin 0.4 MG Caps capsule  Commonly known as:  FLOMAX  Take 1 capsule (0.4 mg total) by mouth daily after supper.     Vitamin D (Ergocalciferol) 50000 UNITS Caps capsule  Commonly known as:   DRISDOL  Take 50,000 Units by mouth every 7 (seven) days.     ZYTIGA 250 MG tablet  Generic drug:  abiraterone Acetate  Take 4 tablets (1,000 mg total) by mouth daily.  Diagnostic Studies: Dg Hip Operative Unilat With Pelvis Right  05/01/2015  CLINICAL DATA:  Right hip arthroplasty EXAM: OPERATIVE RIGHT HIP (WITH PELVIS IF PERFORMED) 2 VIEWS TECHNIQUE: Fluoroscopic spot image(s) were submitted for interpretation post-operatively. COMPARISON:  02/07/2015 right hip radiographs. FINDINGS: Fluoroscopy time 32 seconds. Two nondiagnostic spot fluoroscopic intraoperative radiographs of the right hip were provided. The second image demonstrates postsurgical changes from right hip hemiarthroplasty. IMPRESSION: Intraoperative fluoroscopic guidance for right hip arthroplasty. Electronically Signed   By: Ilona Sorrel M.D.   On: 05/01/2015 12:18   Dg Hip Unilat W Or W/o Pelvis 2-3 Views Right  05/01/2015  CLINICAL DATA:  Status post right hip replacement EXAM: DG HIP (WITH OR WITHOUT PELVIS) 2-3V RIGHT COMPARISON:  02/07/2015 FINDINGS: Right hip replacement is now seen. No acute bony abnormality is noted. No soft tissue changes are seen. IMPRESSION: Status post right hip replacement. Electronically Signed   By: Inez Catalina M.D.   On: 05/01/2015 13:50   Disposition:   Patient is stable and can be discharged home today with home health.  Pt has ambulated an appropriate distance and has cleared stairs.  Pt will continue Lovenox 40mg  injections every 24 hours for DVT prophylaxis.  Will follow-up in the office in 10-15 days for staple removal.       Follow-up Information    Follow up with GAINES, THOMAS CHRISTOPHER, PA-C In 10 days.   Specialties:  Orthopedic Surgery, Emergency Medicine   Why:  For staple removal, For wound re-check   Contact information:   [place] [address] [phone]        Signed: Judson Roch PA-C 05/04/2015, 9:07 AM

## 2015-05-04 NOTE — Care Management Note (Signed)
Case Management Note  Patient Details  Name: Sean Petersen MRN: [id number] Date of Birth: May 17, 1956  Subjective/Objective:      Discussed options for obtaining a rolling walker with Mr Fago. He chose to have one shipped to him from [place] which will arrive at his home in 1 to 2 days. Mr Ramrattan originally told Will from [place] that he already has a rolling walker at home. Will delivered only a bedside commode to Mr Rajagopalan's hospital room. Now that he is ready to be discharged, Mr Cappello is requesting a new rolling walker "because the one I have is in bad shape.' A request for shipment of a rolling walker to Mr Cortes was faxed to [facility].               Action/Plan:   Expected Discharge Date:  05/04/15               Expected Discharge Plan:  [place]  In-House Referral:     Discharge planning Services  CM Consult  Post Acute Care Choice:  Durable Medical Equipment, Home Health Choice offered to:  Patient  DME Arranged:  3-N-1 DME Agency:  [place]:  PT [place]:  [place]  Status of Service:  In process, will continue to follow  Medicare Important Message Given:    Date Medicare IM Given:    Medicare IM give by:    Date Additional Medicare IM Given:    Additional Medicare Important Message give by:     If discussed at [place] of Stay Meetings, dates discussed:    Additional Comments:  Mikyle Sox A, RN 05/04/2015, 2:31 PM

## 2015-05-04 NOTE — Discharge Instructions (Signed)
Diet: As you were doing prior to hospitalization   Shower:  May shower but keep the wounds dry, use an occlusive plastic wrap, NO SOAKING IN TUB.  If the bandage gets wet, change with a clean dry gauze.  Dressing:  You may change your dressing as needed. Change the dressing with sterile gauze dressing.    Activity:  Increase activity slowly as tolerated, but follow the weight bearing instructions below.  No lifting or driving for 6 weeks.  Weight Bearing:   Weight bearing as tolerated to right lower extremity  To prevent constipation: you may use a stool softener such as -  Colace (over the counter) 100 mg by mouth twice a day  Drink plenty of fluids (prune juice may be helpful) and high fiber foods Miralax (over the counter) for constipation as needed.    Itching:  If you experience itching with your medications, try taking only a single pain pill, or even half a pain pill at a time.  You may take up to 10 pain pills per day, and you can also use benadryl over the counter for itching or also to help with sleep.   Precautions:  If you experience chest pain or shortness of breath - call 911 immediately for transfer to the hospital emergency department!!  If you develop a fever greater that 101 F, purulent drainage from wound, increased redness or drainage from wound, or calf pain-Call [place]                                              Follow- Up Appointment:  Please call for an appointment to be seen in 2 weeks at [facility] ted hose during the day. Off at night . Administer shot daily between 9-11am.change  Dressing every 7 days or as needed when soiled

## 2015-05-04 NOTE — Progress Notes (Signed)
Subjective: 3 Days Post-Op Procedure(s) (LRB): TOTAL HIP ARTHROPLASTY ANTERIOR APPROACH (Right) Patient reports pain as 5 on 0-10 scale in the right hip.    Patient is well, and has had no acute complaints or problems Continue Physical Therapy Plan is to go Home after hospital stay. Pt not complaining of N/V with the oxycodone and zofran. Most recent Temp 99.3  Objective: Vital signs in last 24 hours: Temp:  [97.7 F (36.5 C)-99.3 F (37.4 C)] 99.3 F (37.4 C) (12/18 0727) Pulse Rate:  [54-63] 59 (12/18 0727) Resp:  [7-18] 17 (12/18 0727) BP: (112-133)/(62-69) 133/69 mmHg (12/18 0727) SpO2:  [93 %-99 %] 93 % (12/18 0727)  Intake/Output from previous day: 12/17 0701 - 12/18 0700 In: 720 [P.O.:720] Out: -  Intake/Output this shift:     Recent Labs  05/01/15 1659 05/03/15 0322  HGB 12.1* 10.5*    Recent Labs  05/01/15 1659 05/03/15 0322  WBC 12.3* 7.5  RBC 3.75* 3.27*  HCT 36.5* 31.8*  PLT 184 156    Recent Labs  05/01/15 1659 05/03/15 0322  NA  --  139  K  --  4.4  CL  --  108  CO2  --  24  BUN  --  22*  CREATININE 1.22 1.04  GLUCOSE  --  168*  CALCIUM  --  8.2*   No results for input(s): LABPT, INR in the last 72 hours.  EXAM General - Patient is Alert, Appropriate and Oriented Extremity - Neurovascular intact Sensation intact distally Intact pulses distally Dorsiflexion/Plantar flexion intact Dressing - dressing C/D/I Motor Function - intact, moving foot and toes well on exam.   Dressing changed this morning, new honeycomb placed.  Pt denies pain with palpation of the lateral and medial joint line of right knee, negate Homan's sign bilaterally. Pt has moderate pain when placing knee into flexion.  No signs of infection such as warmth or erythema.  Past Medical History  Diagnosis Date  . Type II or unspecified type diabetes mellitus without mention of complication, uncontrolled   . Other and unspecified hyperlipidemia   . Essential  hypertension, benign   . Myocardial infarction ([place])   . Anxiety   . Depression   . Obesity   . Varicosities   . Chronic kidney disease     stage 3  . Prostate cancer ([place])   . Cancer [facility])     states he had cancer in his left forearm but is unaware of type and was told his body took care of it  . PONV (postoperative nausea and vomiting)     nausea  . Family history of adverse reaction to anesthesia     adopted    Assessment/Plan:   3 Days Post-Op Procedure(s) (LRB): TOTAL HIP ARTHROPLASTY ANTERIOR APPROACH (Right) Active Problems:   Primary osteoarthritis of right hip  Estimated body mass index is 33.46 kg/(m^2) as calculated from the following:   Height as of this encounter: 5\' 8"  (1.727 m).   Weight as of this encounter: 99.8 kg (220 lb 0.3 oz). Advance diet Up with therapy Discharge home with home health  Pt has had a BM Labs reviewed.  Pt did stairs and ambulated 240 feet yesterday on POD1 and he has cleared stairs.  I believe his knee pain is likely due to stresses placed on his right leg with PT yesterday.  Will continue to monitor, will get up with PT today.  Will discharge home today.  DVT Prophylaxis - Lovenox, Foot Pumps and  TED hose Weight-Bearing as tolerated to right leg D/C O2 and Pulse OX and try on [place], PA-C [place] 05/04/2015, 9:04 AM

## 2015-05-04 NOTE — Care Management Note (Signed)
Case Management Note  Patient Details  Name: Sean Petersen MRN: [id number] Date of Birth: 1955-11-16  Subjective/Objective:  A referral for home health PT was confirmed with [facility]. He is already on their list of patients to see. Mr Wigginton already has DME equipment at home.                   Action/Plan:   Expected Discharge Date:  05/04/15               Expected Discharge Plan:  [place]  In-House Referral:     Discharge planning Services  CM Consult  Post Acute Care Choice:  Durable Medical Equipment, Home Health Choice offered to:  Patient  DME Arranged:  3-N-1 DME Agency:  [place]:  PT [place]:  [place]  Status of Service:  In process, will continue to follow  Medicare Important Message Given:    Date Medicare IM Given:    Medicare IM give by:    Date Additional Medicare IM Given:    Additional Medicare Important Message give by:     If discussed at [place] of Stay Meetings, dates discussed:    Additional Comments:  Liticia Gasior A, RN 05/04/2015, 9:41 AM

## 2015-05-05 ENCOUNTER — Telehealth: Payer: Self-pay

## 2015-05-05 LAB — SURGICAL PATHOLOGY

## 2015-05-05 NOTE — Telephone Encounter (Signed)
Erroneous note encounter  

## 2015-05-09 ENCOUNTER — Inpatient Hospital Stay: Payer: 59 | Admitting: Hematology and Oncology

## 2015-05-09 ENCOUNTER — Ambulatory Visit: Payer: 59

## 2015-05-09 ENCOUNTER — Ambulatory Visit: Payer: 59 | Admitting: Hematology and Oncology

## 2015-05-09 ENCOUNTER — Other Ambulatory Visit: Payer: 59

## 2015-05-09 ENCOUNTER — Inpatient Hospital Stay: Payer: 59

## 2015-05-15 ENCOUNTER — Other Ambulatory Visit: Payer: Self-pay | Admitting: Nurse Practitioner

## 2015-06-05 ENCOUNTER — Telehealth: Payer: Self-pay | Admitting: *Deleted

## 2015-06-05 NOTE — Telephone Encounter (Signed)
Received fax. Pt no longer has health insurance. He will need co-pay assistance through [employer]. Application for the Zytiga assistance was faxed as well to our office.  Called pt. Left vm asking pt to bring proof of federal tax income, # of hold house residence and estimated income based on last year's tax statement.  Pt has an apt with Dr. Massie Maroon tom. I asked pt to bring this information with him. He will also need to sign the pt assistance forms.  Pt will need a new RX for the zytiga to accompany the pt assistance application.

## 2015-06-06 ENCOUNTER — Ambulatory Visit: Payer: Self-pay | Admitting: Radiation Oncology

## 2015-06-06 ENCOUNTER — Other Ambulatory Visit: Payer: Self-pay | Admitting: Internal Medicine

## 2015-06-06 ENCOUNTER — Inpatient Hospital Stay: Admission: RE | Admit: 2015-06-06 | Payer: Self-pay | Source: Ambulatory Visit | Admitting: Radiation Oncology

## 2015-06-06 DIAGNOSIS — C771 Secondary and unspecified malignant neoplasm of intrathoracic lymph nodes: Principal | ICD-10-CM

## 2015-06-06 DIAGNOSIS — C61 Malignant neoplasm of prostate: Secondary | ICD-10-CM

## 2015-06-06 MED ORDER — ZYTIGA 250 MG PO TABS: 1000.0000 mg | ORAL_TABLET | Freq: Every day | ORAL | Status: DC

## 2015-06-11 ENCOUNTER — Telehealth: Payer: Self-pay | Admitting: *Deleted

## 2015-06-11 NOTE — Telephone Encounter (Signed)
Pt returned RN's call. Pt has a hip replacement in December. Pt states that "ever since my orthopedic MD performed hip replacement, I have had severe knee pain due to arthritis. I had a cortisone injection in the knee, but the injection seemed to make the pain worse." "I missed all my appointments because I can 't even drive. My sister transports me to all my appointments." pt states that he took his last Zytiga tablet yesterday 06/10/15. He is completely out. He is unable to get any RF as he is now self pay. He has not seen oncologist since 03/14/15 and has missed several lab appointment dates. I explained the importance of these appointments. The patient states that "due to my depression, I didn't feel like coming to these appointments. My wife has had her share of medical problems and I'm just really frustrated. I feel that if I ever have to live by myself, I will have nothing to live for anymore in my life. Don't take me saying this as if I plan to hurt myself because I do not plan to do that right now. I'm just really frustrated at my circumstances. I lost my job and my health insurance." "I'm having lots of problems. I have numbness in left hand like I have neuropathy. I keep dropping objects from my hand. I am also experiencing a rash on my chest and back, but it doesn't itch."  I offered the patient an apt to peak to the nurse and provider about his concerns. He agreed to come this Friday 06/13/15 at 1415.   Effective listening & reassurance provided for patient. I explained to the patient that [facility] reached out to the cancer center and provided pt with financial application for [employer] for Zytiga. I have the application; however, it will require that the patient to provide last year's tax return or proof of yearly income.  He states that "he has not been to the post office these last few days and he doesn't know if his 2016 proof of income W2 form has been sent to him yet. He has not filed the  2016 taxes at this time. He states that he doesn't want to present his 2015 tax return copies because he was making more money at that time.  I asked the patient to bring the [place] form if available. As soon as patient can provide this information, the sooner, I can help him get assistance with the [employer] foundation. I explained that this process could take up to 4-6 weeks to approve for patient assistance.  In the meantime, I need him to come to see the doctor, obtain routine labs for further work up and evaluation. I also offered a chaplin services and cancer center counselor. He states that he doesn't want to "talk to anyone but to the nurse and doctor about his depression."  Approximately 25 minutes spent talking to patient.

## 2015-06-13 ENCOUNTER — Inpatient Hospital Stay: Payer: Self-pay | Attending: Internal Medicine | Admitting: Internal Medicine

## 2015-06-13 ENCOUNTER — Encounter: Payer: Self-pay | Admitting: Internal Medicine

## 2015-06-13 ENCOUNTER — Inpatient Hospital Stay: Payer: Self-pay

## 2015-06-13 VITALS — BP 146/76 | HR 64 | Temp 98.4°F | Resp 18 | Wt 201.3 lb

## 2015-06-13 DIAGNOSIS — Z96649 Presence of unspecified artificial hip joint: Secondary | ICD-10-CM

## 2015-06-13 DIAGNOSIS — I252 Old myocardial infarction: Secondary | ICD-10-CM

## 2015-06-13 DIAGNOSIS — E669 Obesity, unspecified: Secondary | ICD-10-CM

## 2015-06-13 DIAGNOSIS — Z7951 Long term (current) use of inhaled steroids: Secondary | ICD-10-CM

## 2015-06-13 DIAGNOSIS — E1165 Type 2 diabetes mellitus with hyperglycemia: Secondary | ICD-10-CM

## 2015-06-13 DIAGNOSIS — F419 Anxiety disorder, unspecified: Secondary | ICD-10-CM

## 2015-06-13 DIAGNOSIS — R5383 Other fatigue: Secondary | ICD-10-CM

## 2015-06-13 DIAGNOSIS — Z79899 Other long term (current) drug therapy: Secondary | ICD-10-CM

## 2015-06-13 DIAGNOSIS — M1611 Unilateral primary osteoarthritis, right hip: Secondary | ICD-10-CM

## 2015-06-13 DIAGNOSIS — F4010 Social phobia, unspecified: Secondary | ICD-10-CM

## 2015-06-13 DIAGNOSIS — Z7984 Long term (current) use of oral hypoglycemic drugs: Secondary | ICD-10-CM

## 2015-06-13 DIAGNOSIS — N183 Chronic kidney disease, stage 3 (moderate): Secondary | ICD-10-CM

## 2015-06-13 DIAGNOSIS — E785 Hyperlipidemia, unspecified: Secondary | ICD-10-CM

## 2015-06-13 DIAGNOSIS — I129 Hypertensive chronic kidney disease with stage 1 through stage 4 chronic kidney disease, or unspecified chronic kidney disease: Secondary | ICD-10-CM

## 2015-06-13 DIAGNOSIS — C7951 Secondary malignant neoplasm of bone: Secondary | ICD-10-CM

## 2015-06-13 DIAGNOSIS — F329 Major depressive disorder, single episode, unspecified: Secondary | ICD-10-CM

## 2015-06-13 DIAGNOSIS — M25561 Pain in right knee: Secondary | ICD-10-CM

## 2015-06-13 DIAGNOSIS — F17210 Nicotine dependence, cigarettes, uncomplicated: Secondary | ICD-10-CM

## 2015-06-13 DIAGNOSIS — I8390 Asymptomatic varicose veins of unspecified lower extremity: Secondary | ICD-10-CM

## 2015-06-13 DIAGNOSIS — C61 Malignant neoplasm of prostate: Secondary | ICD-10-CM

## 2015-06-13 MED ORDER — ESCITALOPRAM OXALATE 10 MG PO TABS
10.0000 mg | ORAL_TABLET | Freq: Every day | ORAL | Status: DC
Start: 2015-06-13 — End: 2015-06-13

## 2015-06-13 MED ORDER — SERTRALINE HCL 50 MG PO TABS: 50.0000 mg | ORAL_TABLET | Freq: Every day | ORAL | Status: DC

## 2015-06-13 MED ORDER — ESCITALOPRAM OXALATE 10 MG PO TABS: 10.0000 mg | ORAL_TABLET | Freq: Every day | ORAL | Status: DC

## 2015-06-13 NOTE — Progress Notes (Signed)
[place] OFFICE PROGRESS NOTE  Patient Care Team: Tracie Harrier, MD as PCP - General (Internal Medicine)   SUMMARY OF ONCOLOGIC HISTORY:  # 2014- PROSTATE CA [s/p Bx-Gleason score 5+4; s/p EBRT [Dr.Crystal];July 2015- Biochem Relapse [PSA-15]; non-compliance with Lupron; JAN 2016-PSA 50   # MAY 2016- PSA-100/Bone scan- Bone mets  [s/p EBRT Right Hip]; CT A/P- PELVIC/RP LN;   # July 2016- ? CASTRATE RESISTANT START ZYTIGA + Prednisone; AUG PSA 3.2; OCT 2016- CT- Improved Pelvic/RP LN ~10-17MM; Multiple bone lesions;OCT 2016-Bone scan- Neg for progressive mets.OCT 2016- 1.   # Bone mets- X-geva q 8W     INTERVAL HISTORY:  60 year old male patient with above history of metastatic prostate cancer question castrate resistant currently on first line therapy with Zytiga plus prednisone; Lupron every 4 months; X-geva is for follow-up.  In the interim patient underwent right hip  Replacement.  Patient has noted significant improvement of his hip pain.  However he continues to have significant pain in his right knee.  He continues to take pain medications.   Unfortunately he has lost insurance;  This is created lot of stress for him.  He feels depressed. Patient continues to feel tired which is chronic.   REVIEW OF SYSTEMS:  A complete 10 point review of system is done which is negative except mentioned above/history of present illness.   PAST MEDICAL HISTORY :  Past Medical History  Diagnosis Date  . Type II or unspecified type diabetes mellitus without mention of complication, uncontrolled   . Other and unspecified hyperlipidemia   . Essential hypertension, benign   . Myocardial infarction ([place])   . Anxiety   . Depression   . Obesity   . Varicosities   . Chronic kidney disease     stage 3  . Prostate cancer ([place])   . Cancer [facility])     states he had cancer in his left forearm but is unaware of type and was told his body took care of it  . PONV (postoperative  nausea and vomiting)     nausea  . Family history of adverse reaction to anesthesia     adopted    PAST SURGICAL HISTORY :   Past Surgical History  Procedure Laterality Date  . Colonoscopy  2013  . Intraoperative arteriogram      for vascular disease  . Colonoscopy with propofol N/A 01/23/2015    Procedure: COLONOSCOPY WITH PROPOFOL;  Surgeon: Hulen Luster, MD;  Location: [facility] ENDOSCOPY;  Service: Gastroenterology;  Laterality: N/A;  . Tonsillectomy    . Cardiac catheterization    . Total hip arthroplasty Right 05/01/2015    Procedure: TOTAL HIP ARTHROPLASTY ANTERIOR APPROACH;  Surgeon: Hessie Knows, MD;  Location: [facility] ORS;  Service: Orthopedics;  Laterality: Right;    FAMILY HISTORY :   Family History  Problem Relation Age of Onset  . Adopted: Yes    SOCIAL HISTORY:   Social History  Substance Use Topics  . Smoking status: Current Every Day Smoker -- 0.50 packs/day    Types: Cigarettes  . Smokeless tobacco: Never Used  . Alcohol Use: 0.0 oz/week    0 Standard drinks or equivalent per week     Comment: 28 12 ounce beers per week    ALLERGIES:  is allergic to codeine sulfate and prednisone.  MEDICATIONS:  Current Outpatient Prescriptions  Medication Sig Dispense Refill  . ALPRAZolam (XANAX) 0.5 MG tablet Take 0.5 mg by mouth 2 (two) times daily as needed.     Marland Kitchen  amLODipine (NORVASC) 10 MG tablet Take 10 mg by mouth daily.    . ANUCORT-HC 25 MG suppository Place 25 mg rectally daily as needed.     Marland Kitchen atenolol (TENORMIN) 100 MG tablet Take 100 mg by mouth daily.    Marland Kitchen dicyclomine (BENTYL) 10 MG capsule Take 10 mg by mouth 4 (four) times daily -  before meals and at bedtime.    . enoxaparin (LOVENOX) 40 MG/0.4ML injection Inject 0.4 mLs (40 mg total) into the skin daily. 14 Syringe 0  . isosorbide mononitrate (IMDUR) 30 MG 24 hr tablet Take 30 mg by mouth daily.    Marland Kitchen lisinopril (PRINIVIL,ZESTRIL) 20 MG tablet Take 20 mg by mouth daily.     . metFORMIN (GLUCOPHAGE) 1000 MG  tablet Take 1,000 mg by mouth daily with breakfast.     . morphine (ROXANOL) 20 MG/ML concentrated solution Take 1 mL (20 mg total) by mouth every 2 (two) hours as needed for severe pain (Take 0.5 - 1 mL (10 - 20 mg) once every 6-8 hours as needed for pain). 90 mL 0  . Multiple Vitamins-Minerals (MEGA MULTI MEN PO) Take 1 tablet by mouth daily.     . ondansetron (ZOFRAN) 4 MG tablet Take 1 tablet (4 mg total) by mouth every 6 (six) hours as needed for nausea. 40 tablet 0  . oxyCODONE (OXY IR/ROXICODONE) 5 MG immediate release tablet Take 1-2 tablets (5-10 mg total) by mouth every 4 (four) hours as needed for breakthrough pain. 60 tablet 0  . predniSONE (DELTASONE) 5 MG tablet Take 1 tablet (5 mg total) by mouth 2 (two) times daily with a meal. 60 tablet 6  . sertraline (ZOLOFT) 25 MG tablet Take 25 mg by mouth daily.     . simvastatin (ZOCOR) 40 MG tablet Take 40 mg by mouth every evening.    . tamsulosin (FLOMAX) 0.4 MG CAPS capsule Take 1 capsule (0.4 mg total) by mouth daily after supper. 90 capsule 4  . Vitamin D, Ergocalciferol, (DRISDOL) 50000 UNITS CAPS capsule Take 50,000 Units by mouth every 7 (seven) days.     Marland Kitchen ZYTIGA 250 MG tablet Take 4 tablets (1,000 mg total) by mouth daily. 120 tablet 12  . gabapentin (NEURONTIN) 100 MG capsule Take by mouth.     No current facility-administered medications for this visit.    PHYSICAL EXAMINATION: ECOG PERFORMANCE STATUS: 2 - Symptomatic, <50% confined to bed  BP 146/76 mmHg  Pulse 64  Temp(Src) 98.4 F (36.9 C) (Tympanic)  Resp 18  Wt 201 lb 4.5 oz (91.3 kg)  Filed Weights   06/13/15 1434  Weight: 201 lb 4.5 oz (91.3 kg)    GENERAL: Well-nourished well-developed; Alert, no distress and comfortable.  Cannot sit on the exam table because of pain. Obese.  He is accompanied by his sister. EYES: no pallor or icterus OROPHARYNX: no thrush or ulceration; good dentition  NECK: supple, no masses felt LYMPH:  no palpable lymphadenopathy in  the cervical, axillary or inguinal regions LUNGS: clear to auscultation and  No wheeze or crackles HEART/CVS: regular rate & rhythm and no murmurs; No lower extremity edema ABDOMEN:abdomen soft, non-tender and normal bowel sounds Musculoskeletal:no cyanosis of digits and no clubbing  PSYCH: alert & oriented x 3 with fluent speech NEURO: no focal motor/sensory deficits SKIN:  no rashes or significant lesions  LABORATORY DATA:  I have reviewed the data as listed    Component Value Date/Time   NA 139 05/03/2015 0322   K 4.4  05/03/2015 0322   CL 108 05/03/2015 0322   CO2 24 05/03/2015 0322   GLUCOSE 168* 05/03/2015 0322   BUN 22* 05/03/2015 0322   CREATININE 1.04 05/03/2015 0322   CALCIUM 8.2* 05/03/2015 0322   PROT 6.8 03/07/2015 1358   ALBUMIN 4.0 03/07/2015 1358   AST 26 03/07/2015 1358   ALT 17 03/07/2015 1358   ALKPHOS 67 03/07/2015 1358   BILITOT 0.7 03/07/2015 1358   GFRNONAA >60 05/03/2015 0322   GFRAA >60 05/03/2015 0322    No results found for: SPEP, UPEP  Lab Results  Component Value Date   WBC 7.5 05/03/2015   NEUTROABS 6.6* 03/07/2015   HGB 10.5* 05/03/2015   HCT 31.8* 05/03/2015   MCV 97.2 05/03/2015   PLT 156 05/03/2015      Chemistry      Component Value Date/Time   NA 139 05/03/2015 0322   K 4.4 05/03/2015 0322   CL 108 05/03/2015 0322   CO2 24 05/03/2015 0322   BUN 22* 05/03/2015 0322   CREATININE 1.04 05/03/2015 0322      Component Value Date/Time   CALCIUM 8.2* 05/03/2015 0322   ALKPHOS 67 03/07/2015 1358   AST 26 03/07/2015 1358   ALT 17 03/07/2015 1358   BILITOT 0.7 03/07/2015 1358       RADIOGRAPHIC STUDIES:   ASSESSMENT & PLAN:   # METASTATIC CASTRATE RESISTANT PROSTATE CANCER currently on first line therapy with Zytiga plus prednisone since July 2016/Lupron every 4 months.  PSA March 07 2015- 1;chest abdomen pelvis- stable bone lesions/ stable retroperitoneal adenopathy/bone scan-  Stable/ improved  Bone lesions.  #  Given  the issues with insurance-  Fabio Asa is currently on hold;  We'll start Zytiga  Plus prednisone when the patient  The drug available. For now continue  Lupron every 4 months.  I have ordered labs for CBC CMP PSA testosterone [ done through [facility]  #  Xgeva  Every 8 weeks;  Currently on hold because of absence of labs.  #  Right hip  Osteoarthritis-  Pain improved post hip replacement.  #  Depression-  Patient is on Zoloft increase the dose to 50 mg once a day.  New prescriptions  Sent to his pharmacy.  # Chronic  kneepain on narcotics  #  Patient due for  Lupron in February;  We will schedule this based on his labs.  Patient follow-up- planned in 2 months/ not scheduled.  # 25 minutes face-to-face with the patient discussing the above plan of care; more than 50% of time spent on prognosis/ natural history; counseling and coordination.     Cammie Sickle, MD 06/13/2015 2:45 PM

## 2015-06-13 NOTE — Patient Instructions (Signed)

## 2015-06-13 NOTE — Progress Notes (Signed)
RN notified by scheduling. Pt refuses labs at cancer center today and would like to go to [place] for all labs. MD notified. Will write order for [facility].  Pt declined to have patient assistance forms completed today for [employer]. He does not have his tax returns for 2016 completed. He would like to wait until next week to get his taxes completed. He will bring his information next week. I explained that he will need to sign the application as well.  He also brought an additional disability form today for the md to complete and update for his records. He will be applying for [employer] insurance but has not done this at this time.  The patient expresses emotional distress relating his financial concerns. He is on disability and lost his health insurance. He will need patient assistance with Zytiga which will take approximately 4-6 weeks to process his application. The patient has not had any labs since October. He did not keep any follow-up appointments in our clinic since the last visit with md. He voices concerns over depression and anxiety but "refuses to talk to anyone else about his problems." The patient states "if my wife dies I will have not will to live. I'm going to be honest with you. I don't want to talk to a speciality about this depression because I would just tell them what they would want to hear to just get out of the appointment" pt encouraged to vocalize verbal feelings. Pt provided with supported community resources. Clinical social work consult placed.

## 2015-06-18 ENCOUNTER — Other Ambulatory Visit: Payer: Self-pay | Admitting: Internal Medicine

## 2015-06-18 ENCOUNTER — Telehealth: Payer: Self-pay | Admitting: *Deleted

## 2015-06-18 NOTE — Telephone Encounter (Signed)
MD reviewed labs from Sean Petersen. V/o given to nurse to have scheduling arrange xygeva and lupron next week on 06/27/15.  The patient will need to return to clinic in 2 months. Labs to be performed at [facility]: cbc, cmp and testosterone level. I will give the patient the [facility] slip when he comes next week for his treatment. I asked cancer center scheduling to contact the patient with these appointments.  On a clinical note, The patient has not yet signed his patient assistance forms or brought his tax returns to the clinic. I need this information before patient assistance to help the patient apply for patient financial assistance for Zygtia.

## 2015-06-20 NOTE — Telephone Encounter (Signed)
Pt came by cancer center on 06/20/15 at 1245pm. RN spent 20 minutes with patient discussing his concerns and active listening. The patient brought his tax returns and the application for zytiga assistance was signed by patient. I will submit this paperwork. I also reviewed patient's limitations for his disability papers.  He states that he is uncertain whether he will keep the apt on 2/10 for his xgeva and lupron. He is waiting to hear back on his health insurance policy. He states that he should know something next week whether his insurance will be reinstated with [facility].

## 2015-06-27 ENCOUNTER — Inpatient Hospital Stay: Payer: 59

## 2015-06-27 NOTE — Progress Notes (Unsigned)
Patient called PSN back.  Patient stated that he would like to meet to discuss financial concerns.  He stated that he will call PSN back the first of next week to schedule and appointment.

## 2015-06-27 NOTE — Progress Notes (Unsigned)
PSN left a message with patient's wife, asking that patient return call to discuss financial concerns.  Awaiting a return phone call.

## 2015-07-01 ENCOUNTER — Inpatient Hospital Stay: Payer: 59

## 2015-07-02 ENCOUNTER — Inpatient Hospital Stay: Payer: 59

## 2015-07-04 ENCOUNTER — Other Ambulatory Visit: Payer: Self-pay | Admitting: Orthopedic Surgery

## 2015-07-04 DIAGNOSIS — M2391 Unspecified internal derangement of right knee: Secondary | ICD-10-CM

## 2015-07-07 ENCOUNTER — Inpatient Hospital Stay: Payer: 59 | Attending: Internal Medicine

## 2015-07-07 ENCOUNTER — Other Ambulatory Visit: Payer: Self-pay | Admitting: *Deleted

## 2015-07-07 ENCOUNTER — Inpatient Hospital Stay: Payer: 59

## 2015-07-07 DIAGNOSIS — Z79899 Other long term (current) drug therapy: Secondary | ICD-10-CM | POA: Diagnosis not present

## 2015-07-07 DIAGNOSIS — C7951 Secondary malignant neoplasm of bone: Secondary | ICD-10-CM | POA: Insufficient documentation

## 2015-07-07 DIAGNOSIS — Z79818 Long term (current) use of other agents affecting estrogen receptors and estrogen levels: Secondary | ICD-10-CM | POA: Insufficient documentation

## 2015-07-07 DIAGNOSIS — C61 Malignant neoplasm of prostate: Secondary | ICD-10-CM | POA: Diagnosis present

## 2015-07-07 LAB — CREATININE, SERUM
Creatinine, Ser: 1.19 mg/dL (ref 0.61–1.24)
GFR calc Af Amer: >60 mL/min (ref >60)

## 2015-07-07 LAB — CALCIUM: Calcium: 9.5 mg/dL (ref 8.9–10.3)

## 2015-07-07 MED ORDER — DENOSUMAB 120 MG/1.7ML ~~LOC~~ SOLN
120.0000 mg | Freq: Once | SUBCUTANEOUS | Status: AC
  Administered 2015-07-07: 120 mg via SUBCUTANEOUS
  Filled 2015-07-07: qty 1.7

## 2015-07-07 MED ORDER — LEUPROLIDE ACETATE (4 MONTH) 30 MG IM KIT
30.0000 mg | PACK | Freq: Once | INTRAMUSCULAR | Status: AC
  Administered 2015-07-07: 30 mg via INTRAMUSCULAR
  Filled 2015-07-07: qty 30

## 2015-07-16 ENCOUNTER — Ambulatory Visit: Payer: 59

## 2015-08-01 DIAGNOSIS — G40909 Epilepsy, unspecified, not intractable, without status epilepticus: Secondary | ICD-10-CM | POA: Insufficient documentation

## 2015-08-01 DIAGNOSIS — F17209 Nicotine dependence, unspecified, with unspecified nicotine-induced disorders: Secondary | ICD-10-CM | POA: Insufficient documentation

## 2015-08-05 ENCOUNTER — Ambulatory Visit
Admission: RE | Admit: 2015-08-05 | Discharge: 2015-08-05 | Disposition: A | Discharge status: Home / Self Care | Payer: 59 | Source: Ambulatory Visit | Attending: Orthopedic Surgery | Admitting: Orthopedic Surgery

## 2015-08-05 DIAGNOSIS — M2391 Unspecified internal derangement of right knee: Secondary | ICD-10-CM | POA: Diagnosis present

## 2015-08-05 DIAGNOSIS — M948X6 Other specified disorders of cartilage, lower leg: Secondary | ICD-10-CM | POA: Insufficient documentation

## 2015-08-05 DIAGNOSIS — M23303 Other meniscus derangements, unspecified medial meniscus, right knee: Secondary | ICD-10-CM | POA: Diagnosis not present

## 2015-08-05 DIAGNOSIS — M1711 Unilateral primary osteoarthritis, right knee: Secondary | ICD-10-CM | POA: Insufficient documentation

## 2015-08-13 ENCOUNTER — Inpatient Hospital Stay: Payer: 59 | Admitting: Internal Medicine

## 2015-08-22 ENCOUNTER — Inpatient Hospital Stay: Payer: 59 | Attending: Internal Medicine | Admitting: Internal Medicine

## 2015-08-22 ENCOUNTER — Inpatient Hospital Stay: Payer: 59

## 2015-08-22 VITALS — BP 141/78 | HR 62 | Temp 98.5°F | Wt 207.5 lb

## 2015-08-22 DIAGNOSIS — I10 Essential (primary) hypertension: Secondary | ICD-10-CM | POA: Insufficient documentation

## 2015-08-22 DIAGNOSIS — C61 Malignant neoplasm of prostate: Secondary | ICD-10-CM

## 2015-08-22 DIAGNOSIS — E669 Obesity, unspecified: Secondary | ICD-10-CM | POA: Diagnosis not present

## 2015-08-22 DIAGNOSIS — Z7984 Long term (current) use of oral hypoglycemic drugs: Secondary | ICD-10-CM | POA: Diagnosis not present

## 2015-08-22 DIAGNOSIS — I252 Old myocardial infarction: Secondary | ICD-10-CM | POA: Diagnosis not present

## 2015-08-22 DIAGNOSIS — F419 Anxiety disorder, unspecified: Secondary | ICD-10-CM | POA: Insufficient documentation

## 2015-08-22 DIAGNOSIS — F17210 Nicotine dependence, cigarettes, uncomplicated: Secondary | ICD-10-CM | POA: Insufficient documentation

## 2015-08-22 DIAGNOSIS — N183 Chronic kidney disease, stage 3 unspecified: Secondary | ICD-10-CM | POA: Insufficient documentation

## 2015-08-22 DIAGNOSIS — Z7952 Long term (current) use of systemic steroids: Secondary | ICD-10-CM | POA: Insufficient documentation

## 2015-08-22 DIAGNOSIS — E119 Type 2 diabetes mellitus without complications: Secondary | ICD-10-CM | POA: Diagnosis not present

## 2015-08-22 DIAGNOSIS — E785 Hyperlipidemia, unspecified: Secondary | ICD-10-CM | POA: Diagnosis not present

## 2015-08-22 DIAGNOSIS — I129 Hypertensive chronic kidney disease with stage 1 through stage 4 chronic kidney disease, or unspecified chronic kidney disease: Secondary | ICD-10-CM | POA: Insufficient documentation

## 2015-08-22 DIAGNOSIS — Z79899 Other long term (current) drug therapy: Secondary | ICD-10-CM | POA: Diagnosis not present

## 2015-08-22 DIAGNOSIS — C7951 Secondary malignant neoplasm of bone: Secondary | ICD-10-CM | POA: Diagnosis not present

## 2015-08-22 DIAGNOSIS — Z720 Tobacco use: Secondary | ICD-10-CM | POA: Insufficient documentation

## 2015-08-22 DIAGNOSIS — C771 Secondary and unspecified malignant neoplasm of intrathoracic lymph nodes: Principal | ICD-10-CM

## 2015-08-22 DIAGNOSIS — F329 Major depressive disorder, single episode, unspecified: Secondary | ICD-10-CM | POA: Diagnosis not present

## 2015-08-22 LAB — CBC WITH DIFFERENTIAL/PLATELET
Basophils Absolute: 0.1 10*3/uL (ref 0–0.1)
Basophils Relative: 1 %
EOS ABS: 0.2 10*3/uL (ref 0–0.7)
EOS PCT: 3 %
HCT: 36.9 % — ABNORMAL LOW (ref 40.0–52.0)
Hemoglobin: 12.8 g/dL — ABNORMAL LOW (ref 13.0–18.0)
LYMPHS ABS: 0.7 10*3/uL — AB (ref 1.0–3.6)
Lymphocytes Relative: 9 %
MCH: 31.8 pg (ref 26.0–34.0)
MCHC: 34.7 g/dL (ref 32.0–36.0)
MCV: 91.6 fL (ref 80.0–100.0)
MONO ABS: 0.8 10*3/uL (ref 0.2–1.0)
MONOS PCT: 10 %
Neutro Abs: 6.4 10*3/uL (ref 1.4–6.5)
Neutrophils Relative %: 77 %
PLATELETS: 215 10*3/uL (ref 150–440)
RBC: 4.03 MIL/uL — AB (ref 4.40–5.90)
RDW: 15.5 % — AB (ref 11.5–14.5)
WBC: 8.2 10*3/uL (ref 3.8–10.6)

## 2015-08-22 LAB — BASIC METABOLIC PANEL
Anion gap: 6 (ref 5–15)
BUN: 15 mg/dL (ref 6–20)
CALCIUM: 8.8 mg/dL — AB (ref 8.9–10.3)
CO2: 24 mmol/L (ref 22–32)
CREATININE: 0.9 mg/dL (ref 0.61–1.24)
Chloride: 106 mmol/L (ref 101–111)
GFR calc Af Amer: >60 mL/min (ref >60)
Glucose, Bld: 204 mg/dL — ABNORMAL HIGH (ref 65–99)
Potassium: 4.3 mmol/L (ref 3.5–5.1)
SODIUM: 136 mmol/L (ref 135–145)

## 2015-08-22 LAB — PSA: PSA: 7.07 ng/mL — AB (ref 0.00–4.00)

## 2015-08-22 NOTE — Progress Notes (Signed)
[place] OFFICE PROGRESS NOTE  Patient Care Team: Tracie Harrier, MD as PCP - General (Internal Medicine)   SUMMARY OF ONCOLOGIC HISTORY:  # 2014- PROSTATE CA [s/p Bx-Gleason score 5+4; s/p EBRT [Dr.Crystal];July 2015- Biochem Relapse [PSA-15]; non-compliance with Lupron; JAN 2016-PSA 50   # MAY 2016- PSA-100/Bone scan- Bone mets  [s/p EBRT Right Hip]; CT A/P- PELVIC/RP LN;   # July 2016- ? CASTRATE RESISTANT START ZYTIGA + Prednisone; AUG PSA 3.2; OCT 2016- CT- Improved Pelvic/RP LN ~10-17MM; Multiple bone lesions;OCT 2016-Bone scan- Neg for progressive mets.OCT 2016- 1.01   # Bone mets- X-geva q 16 W     INTERVAL HISTORY:  60 year old male patient with above history of metastatic prostate cancer question castrate resistant currently on first line therapy with Zytiga plus prednisone; Lupron every 4 months; X-geva is for follow-up.  Recently lost his job. He is depressed.  He has noted improvement of his hip pain however noted to have worsening knee pain. He was told to have a knee replacement.  Given his elevated blood sugars has been taking 5 prednisone. Otherwise appetite fair. No new swelling in the legs. No shortness of breath.  REVIEW OF SYSTEMS:  A complete 10 point review of system is done which is negative except mentioned above/history of present illness.   PAST MEDICAL HISTORY :  Past Medical History  Diagnosis Date  . Type II or unspecified type diabetes mellitus without mention of complication, uncontrolled   . Other and unspecified hyperlipidemia   . Essential hypertension, benign   . Myocardial infarction ([place])   . Anxiety   . Depression   . Obesity   . Varicosities   . Chronic kidney disease     stage 3  . Prostate cancer ([place])   . Cancer [facility])     states he had cancer in his left forearm but is unaware of type and was told his body took care of it  . PONV (postoperative nausea and vomiting)     nausea  . Family history of adverse  reaction to anesthesia     adopted    PAST SURGICAL HISTORY :   Past Surgical History  Procedure Laterality Date  . Colonoscopy  2013  . Intraoperative arteriogram      for vascular disease  . Colonoscopy with propofol N/A 01/23/2015    Procedure: COLONOSCOPY WITH PROPOFOL;  Surgeon: Hulen Luster, MD;  Location: [facility] ENDOSCOPY;  Service: Gastroenterology;  Laterality: N/A;  . Tonsillectomy    . Cardiac catheterization    . Total hip arthroplasty Right 05/01/2015    Procedure: TOTAL HIP ARTHROPLASTY ANTERIOR APPROACH;  Surgeon: Hessie Knows, MD;  Location: [facility] ORS;  Service: Orthopedics;  Laterality: Right;    FAMILY HISTORY :   Family History  Problem Relation Age of Onset  . Adopted: Yes    SOCIAL HISTORY:   Social History  Substance Use Topics  . Smoking status: Current Every Day Smoker -- 0.50 packs/day    Types: Cigarettes  . Smokeless tobacco: Never Used  . Alcohol Use: 0.0 oz/week    0 Standard drinks or equivalent per week     Comment: 28 12 ounce beers per week    ALLERGIES:  is allergic to codeine sulfate and prednisone.  MEDICATIONS:  Current Outpatient Prescriptions  Medication Sig Dispense Refill  . ALPRAZolam (XANAX) 0.5 MG tablet Take by mouth.    Marland Kitchen amLODipine (NORVASC) 10 MG tablet Take 10 mg by mouth daily.    Marland Kitchen  ANUCORT-HC 25 MG suppository Place 25 mg rectally daily as needed.     Marland Kitchen atenolol (TENORMIN) 100 MG tablet Take 100 mg by mouth daily.    . Cholecalciferol (VITAMIN D) 2000 units tablet     . Cyanocobalamin (RA VITAMIN B-12 TR) 1000 MCG TBCR Take by mouth.    . dicyclomine (BENTYL) 10 MG capsule Take 10 mg by mouth 4 (four) times daily -  before meals and at bedtime.    Marland Kitchen glimepiride (AMARYL) 4 MG tablet Take by mouth.    . isosorbide mononitrate (IMDUR) 30 MG 24 hr tablet Take 30 mg by mouth daily.    Marland Kitchen lisinopril (PRINIVIL,ZESTRIL) 20 MG tablet Take 20 mg by mouth daily.     . metFORMIN (GLUCOPHAGE) 1000 MG tablet Take 1,000 mg by mouth daily  with breakfast.     . Multiple Vitamins-Minerals (MEGA MULTI MEN PO) Take 1 tablet by mouth daily.     . predniSONE (DELTASONE) 5 MG tablet Take by mouth.    . sertraline (ZOLOFT) 50 MG tablet Take 1 tablet (50 mg total) by mouth daily. 30 tablet 3  . simvastatin (ZOCOR) 40 MG tablet Take 40 mg by mouth every evening.    . tamsulosin (FLOMAX) 0.4 MG CAPS capsule Take 1 capsule (0.4 mg total) by mouth daily after supper. 90 capsule 4  . ZYTIGA 250 MG tablet Take 4 tablets (1,000 mg total) by mouth daily. 120 tablet 12  . gabapentin (NEURONTIN) 100 MG capsule Take by mouth.     No current facility-administered medications for this visit.    PHYSICAL EXAMINATION: ECOG PERFORMANCE STATUS: 2 - Symptomatic, <50% confined to bed  BP 141/78 mmHg  Pulse 62  Temp(Src) 98.5 F (36.9 C) (Oral)  Wt 207 lb 7.3 oz (94.1 kg)  SpO2 98%  Filed Weights   08/22/15 1334  Weight: 207 lb 7.3 oz (94.1 kg)    GENERAL: Well-nourished well-developed; Alert, no distress and comfortable.  He can sit on exam table by himself.  Obese.  He is Alone. EYES: no pallor or icterus OROPHARYNX: no thrush or ulceration; good dentition  NECK: supple, no masses felt LYMPH:  no palpable lymphadenopathy in the cervical, axillary or inguinal regions LUNGS: clear to auscultation and  No wheeze or crackles HEART/CVS: regular rate & rhythm and no murmurs; No lower extremity edema ABDOMEN:abdomen soft, non-tender and normal bowel sounds Musculoskeletal:no cyanosis of digits and no clubbing  PSYCH: alert & oriented x 3 with fluent speech NEURO: no focal motor/sensory deficits SKIN:  no rashes or significant lesions  LABORATORY DATA:  I have reviewed the data as listed    Component Value Date/Time   NA 136 08/22/2015 1309   K 4.3 08/22/2015 1309   CL 106 08/22/2015 1309   CO2 24 08/22/2015 1309   GLUCOSE 204* 08/22/2015 1309   BUN 15 08/22/2015 1309   CREATININE 0.90 08/22/2015 1309   CALCIUM 8.8* 08/22/2015 1309    PROT 6.8 03/07/2015 1358   ALBUMIN 4.0 03/07/2015 1358   AST 26 03/07/2015 1358   ALT 17 03/07/2015 1358   ALKPHOS 67 03/07/2015 1358   BILITOT 0.7 03/07/2015 1358   GFRNONAA >60 08/22/2015 1309   GFRAA >60 08/22/2015 1309    No results found for: SPEP, UPEP  Lab Results  Component Value Date   WBC 8.2 08/22/2015   NEUTROABS 6.4 08/22/2015   HGB 12.8* 08/22/2015   HCT 36.9* 08/22/2015   MCV 91.6 08/22/2015   PLT 215 08/22/2015  Chemistry      Component Value Date/Time   NA 136 08/22/2015 1309   K 4.3 08/22/2015 1309   CL 106 08/22/2015 1309   CO2 24 08/22/2015 1309   BUN 15 08/22/2015 1309   CREATININE 0.90 08/22/2015 1309      Component Value Date/Time   CALCIUM 8.8* 08/22/2015 1309   ALKPHOS 67 03/07/2015 1358   AST 26 03/07/2015 1358   ALT 17 03/07/2015 1358   BILITOT 0.7 03/07/2015 1358       RADIOGRAPHIC STUDIES:   ASSESSMENT & PLAN:   # METASTATIC CASTRATE RESISTANT PROSTATE CANCER currently on first line therapy with Zytiga plus prednisone since July 2016/Lupron every 4 months.  PSA March 07 2015- 1;chest abdomen pelvis- stable bone lesions/ stable retroperitoneal adenopathy/bone scan-  Stable/ improved  Bone lesions.PSA from today is pending.  # Patient on Zytiga 4 pills a day plus prednisone 5 mg [because of diabetes]; He'll get Lupron in 8 weeks. No major untoward side effects noted.  #  Xgeva  Every 16 weeks; patient is on calcium and vitamin D. He will get in 8 weeks.    #  Right hip  Osteoarthritis-  Pain improved post hip replacement. Right knee pain- needing replacement.   #  Depression-  Patient is on Zoloft increase the dose to 50 mg once a day. Discussed to check with his PCP regarding changing the medications.  #  Patient follow-up- planned in 2 months/ Lupron Xgeva.     Cammie Sickle, MD 08/22/2015 1:53 PM

## 2015-08-25 ENCOUNTER — Other Ambulatory Visit: Payer: Self-pay | Admitting: Internal Medicine

## 2015-08-25 ENCOUNTER — Telehealth: Payer: Self-pay | Admitting: *Deleted

## 2015-08-25 DIAGNOSIS — C61 Malignant neoplasm of prostate: Secondary | ICD-10-CM

## 2015-08-25 DIAGNOSIS — C771 Secondary and unspecified malignant neoplasm of intrathoracic lymph nodes: Principal | ICD-10-CM

## 2015-08-25 NOTE — Telephone Encounter (Signed)
Called patient to inform him that his PSA is slightly elevated @ 7.  MD ordering CT scan to be performed a few days before next appointment in June.  Scheduling will contacting patient with appointment information.  Patient verbalized understanding.

## 2015-08-25 NOTE — Telephone Encounter (Signed)
-----   Message from Cammie Sickle, MD sent at 08/25/2015  7:41 AM EDT ----- Amandeep Hogston-Please inform patient that this PSA slightly up at 7; we will get CT chest abdomen pelvis/bone scan- few days prior to next visit. Thx

## 2015-10-07 ENCOUNTER — Ambulatory Visit: Payer: 59

## 2015-10-07 ENCOUNTER — Encounter
Admission: RE | Admit: 2015-10-07 | Discharge: 2015-10-07 | Disposition: A | Discharge status: Home / Self Care | Payer: 59 | Source: Ambulatory Visit | Attending: Internal Medicine | Admitting: Internal Medicine

## 2015-10-07 DIAGNOSIS — C771 Secondary and unspecified malignant neoplasm of intrathoracic lymph nodes: Secondary | ICD-10-CM | POA: Insufficient documentation

## 2015-10-07 DIAGNOSIS — C61 Malignant neoplasm of prostate: Secondary | ICD-10-CM | POA: Diagnosis present

## 2015-10-07 DIAGNOSIS — R937 Abnormal findings on diagnostic imaging of other parts of musculoskeletal system: Secondary | ICD-10-CM | POA: Diagnosis not present

## 2015-10-07 MED ORDER — TECHNETIUM TC 99M MEDRONATE IV KIT
25.0000 | PACK | Freq: Once | INTRAVENOUS | Status: AC | PRN
  Administered 2015-10-07: 21.66 via INTRAVENOUS

## 2015-10-09 ENCOUNTER — Other Ambulatory Visit: Payer: Self-pay | Admitting: *Deleted

## 2015-10-09 DIAGNOSIS — C61 Malignant neoplasm of prostate: Secondary | ICD-10-CM

## 2015-10-09 MED ORDER — TAMSULOSIN HCL 0.4 MG PO CAPS: 0.4000 mg | ORAL_CAPSULE | Freq: Every day | ORAL | Status: DC

## 2015-10-15 ENCOUNTER — Ambulatory Visit
Admission: RE | Admit: 2015-10-15 | Discharge: 2015-10-15 | Disposition: A | Discharge status: Home / Self Care | Payer: 59 | Source: Ambulatory Visit | Attending: Internal Medicine | Admitting: Internal Medicine

## 2015-10-15 DIAGNOSIS — I700 Atherosclerosis of aorta: Secondary | ICD-10-CM | POA: Diagnosis not present

## 2015-10-15 DIAGNOSIS — J439 Emphysema, unspecified: Secondary | ICD-10-CM | POA: Diagnosis not present

## 2015-10-15 DIAGNOSIS — C7951 Secondary malignant neoplasm of bone: Secondary | ICD-10-CM | POA: Insufficient documentation

## 2015-10-15 DIAGNOSIS — R932 Abnormal findings on diagnostic imaging of liver and biliary tract: Secondary | ICD-10-CM | POA: Insufficient documentation

## 2015-10-15 DIAGNOSIS — C61 Malignant neoplasm of prostate: Secondary | ICD-10-CM | POA: Insufficient documentation

## 2015-10-15 DIAGNOSIS — C771 Secondary and unspecified malignant neoplasm of intrathoracic lymph nodes: Secondary | ICD-10-CM | POA: Insufficient documentation

## 2015-10-15 DIAGNOSIS — I2510 Atherosclerotic heart disease of native coronary artery without angina pectoris: Secondary | ICD-10-CM | POA: Insufficient documentation

## 2015-10-15 DIAGNOSIS — K760 Fatty (change of) liver, not elsewhere classified: Secondary | ICD-10-CM | POA: Insufficient documentation

## 2015-10-15 LAB — POCT I-STAT CREATININE: Creatinine, Ser: 1 mg/dL (ref 0.61–1.24)

## 2015-10-15 MED ORDER — IOPAMIDOL (ISOVUE-300) INJECTION 61%
100.0000 mL | Freq: Once | INTRAVENOUS | Status: AC | PRN
  Administered 2015-10-15: 100 mL via INTRAVENOUS

## 2015-10-23 ENCOUNTER — Other Ambulatory Visit: Payer: 59

## 2015-10-23 ENCOUNTER — Ambulatory Visit: Payer: 59 | Admitting: Internal Medicine

## 2015-10-23 ENCOUNTER — Ambulatory Visit: Payer: 59

## 2015-10-24 ENCOUNTER — Inpatient Hospital Stay: Payer: 59 | Attending: Internal Medicine

## 2015-10-24 ENCOUNTER — Inpatient Hospital Stay: Payer: 59

## 2015-10-24 ENCOUNTER — Inpatient Hospital Stay (HOSPITAL_BASED_OUTPATIENT_CLINIC_OR_DEPARTMENT_OTHER): Payer: 59 | Admitting: Internal Medicine

## 2015-10-24 ENCOUNTER — Other Ambulatory Visit: Payer: Self-pay | Admitting: *Deleted

## 2015-10-24 VITALS — BP 132/74 | HR 52 | Temp 98.5°F | Resp 18 | Wt 207.7 lb

## 2015-10-24 DIAGNOSIS — I129 Hypertensive chronic kidney disease with stage 1 through stage 4 chronic kidney disease, or unspecified chronic kidney disease: Secondary | ICD-10-CM

## 2015-10-24 DIAGNOSIS — F329 Major depressive disorder, single episode, unspecified: Secondary | ICD-10-CM | POA: Insufficient documentation

## 2015-10-24 DIAGNOSIS — K921 Melena: Secondary | ICD-10-CM | POA: Diagnosis not present

## 2015-10-24 DIAGNOSIS — Z79818 Long term (current) use of other agents affecting estrogen receptors and estrogen levels: Secondary | ICD-10-CM | POA: Diagnosis not present

## 2015-10-24 DIAGNOSIS — C7951 Secondary malignant neoplasm of bone: Secondary | ICD-10-CM | POA: Diagnosis not present

## 2015-10-24 DIAGNOSIS — E785 Hyperlipidemia, unspecified: Secondary | ICD-10-CM | POA: Diagnosis not present

## 2015-10-24 DIAGNOSIS — E1165 Type 2 diabetes mellitus with hyperglycemia: Secondary | ICD-10-CM | POA: Insufficient documentation

## 2015-10-24 DIAGNOSIS — C61 Malignant neoplasm of prostate: Secondary | ICD-10-CM

## 2015-10-24 DIAGNOSIS — R197 Diarrhea, unspecified: Secondary | ICD-10-CM | POA: Insufficient documentation

## 2015-10-24 DIAGNOSIS — F17210 Nicotine dependence, cigarettes, uncomplicated: Secondary | ICD-10-CM | POA: Insufficient documentation

## 2015-10-24 DIAGNOSIS — C771 Secondary and unspecified malignant neoplasm of intrathoracic lymph nodes: Secondary | ICD-10-CM

## 2015-10-24 DIAGNOSIS — Z79899 Other long term (current) drug therapy: Secondary | ICD-10-CM | POA: Insufficient documentation

## 2015-10-24 DIAGNOSIS — F418 Other specified anxiety disorders: Secondary | ICD-10-CM | POA: Diagnosis not present

## 2015-10-24 DIAGNOSIS — I252 Old myocardial infarction: Secondary | ICD-10-CM | POA: Diagnosis not present

## 2015-10-24 DIAGNOSIS — Z7984 Long term (current) use of oral hypoglycemic drugs: Secondary | ICD-10-CM | POA: Insufficient documentation

## 2015-10-24 DIAGNOSIS — N183 Chronic kidney disease, stage 3 (moderate): Secondary | ICD-10-CM | POA: Insufficient documentation

## 2015-10-24 DIAGNOSIS — R5383 Other fatigue: Secondary | ICD-10-CM

## 2015-10-24 LAB — CBC WITH DIFFERENTIAL/PLATELET
Basophils Absolute: 0.2 10*3/uL — ABNORMAL HIGH (ref 0–0.1)
Basophils Relative: 3 %
Eosinophils Absolute: 0.5 10*3/uL (ref 0–0.7)
Eosinophils Relative: 7 %
HCT: 37.6 % — ABNORMAL LOW (ref 40.0–52.0)
Hemoglobin: 13.3 g/dL (ref 13.0–18.0)
LYMPHS PCT: 11 %
Lymphs Abs: 0.8 10*3/uL — ABNORMAL LOW (ref 1.0–3.6)
MCH: 32.7 pg (ref 26.0–34.0)
MCHC: 35.3 g/dL (ref 32.0–36.0)
MCV: 92.7 fL (ref 80.0–100.0)
Monocytes Absolute: 0.6 10*3/uL (ref 0.2–1.0)
Monocytes Relative: 9 %
NEUTROS ABS: 5.3 10*3/uL (ref 1.4–6.5)
NEUTROS PCT: 72 %
PLATELETS: 278 10*3/uL (ref 150–440)
RBC: 4.06 MIL/uL — AB (ref 4.40–5.90)
RDW: 14.7 % — ABNORMAL HIGH (ref 11.5–14.5)
WBC: 7.4 10*3/uL (ref 3.8–10.6)

## 2015-10-24 LAB — COMPREHENSIVE METABOLIC PANEL
ALT: 14 U/L — AB (ref 17–63)
AST: 19 U/L (ref 15–41)
Albumin: 3.7 g/dL (ref 3.5–5.0)
Alkaline Phosphatase: 74 U/L (ref 38–126)
Anion gap: 9 (ref 5–15)
BUN: 16 mg/dL (ref 6–20)
CALCIUM: 8.9 mg/dL (ref 8.9–10.3)
CHLORIDE: 101 mmol/L (ref 101–111)
CO2: 25 mmol/L (ref 22–32)
CREATININE: 0.97 mg/dL (ref 0.61–1.24)
Glucose, Bld: 190 mg/dL — ABNORMAL HIGH (ref 65–99)
Potassium: 3.6 mmol/L (ref 3.5–5.1)
Sodium: 135 mmol/L (ref 135–145)
TOTAL PROTEIN: 6.6 g/dL (ref 6.5–8.1)
Total Bilirubin: 0.8 mg/dL (ref 0.3–1.2)

## 2015-10-24 LAB — PSA: PSA: 24.2 ng/mL — ABNORMAL HIGH (ref 0.00–4.00)

## 2015-10-24 LAB — TSH: TSH: 2.206 u[IU]/mL (ref 0.350–4.500)

## 2015-10-24 MED ORDER — DENOSUMAB 120 MG/1.7ML ~~LOC~~ SOLN
120.0000 mg | Freq: Once | SUBCUTANEOUS | Status: AC
  Administered 2015-10-24: 120 mg via SUBCUTANEOUS
  Filled 2015-10-24: qty 1.7

## 2015-10-24 MED ORDER — TAMSULOSIN HCL 0.4 MG PO CAPS: 0.4000 mg | ORAL_CAPSULE | Freq: Every day | ORAL | Status: DC

## 2015-10-24 MED ORDER — LEUPROLIDE ACETATE (4 MONTH) 30 MG IM KIT
30.0000 mg | PACK | Freq: Once | INTRAMUSCULAR | Status: AC
  Administered 2015-10-24: 30 mg via INTRAMUSCULAR
  Filled 2015-10-24: qty 30

## 2015-10-24 MED ORDER — TAMSULOSIN HCL 0.4 MG PO CAPS: 0.4000 mg | ORAL_CAPSULE | Freq: Every day | ORAL | Status: AC

## 2015-10-24 NOTE — Progress Notes (Signed)
Patient states Sean Petersen gives him bloody diarrhea depending on what time of day he takes it.  Patient states he feels like he is 60 years old because he sleeps so much.  States he slept once for 54 hours straight. Finds he sleeps more and more.

## 2015-10-24 NOTE — Progress Notes (Signed)
[place] OFFICE PROGRESS NOTE  Patient Care Team: Tracie Harrier, MD as PCP - General (Internal Medicine)   SUMMARY OF ONCOLOGIC HISTORY:  # 2014- PROSTATE CA [s/p Bx-Gleason score 5+4; s/p EBRT [Dr.Crystal];July 2015- Biochem Relapse [PSA-15]; non-compliance with Lupron; JAN 2016-PSA 50   # MAY 2016- PSA-100/Bone scan- Bone mets  [s/p EBRT Right Hip]; CT A/P- PELVIC/RP LN;   # July 2016- ? CASTRATE RESISTANT START ZYTIGA + Prednisone; AUG PSA 3.2; OCT 2016- CT- Improved Pelvic/RP LN ~10-17MM; Multiple bone lesions;OCT 2016-Bone scan- Neg for progressive mets.OCT 2016- 1.01   # Bone mets- X-geva q 16 W     INTERVAL HISTORY:  60 year old male patient with above history of metastatic prostate cancer question castrate resistant currently on first line therapy with Zytiga plus prednisone; Lupron every 4 months; X-geva is for follow-up.  Patient complains of significant fatigue over the last many months. He has been sleeping a lot. He continues to feel depressed-on Zoloft.  He complains of "bloody loose stool" after taking the Zytiga. Given his elevated blood sugars has been taking 5 prednisone. Otherwise appetite fair. No new swelling in the legs. No shortness of breath.  REVIEW OF SYSTEMS:  A complete 10 point review of system is done which is negative except mentioned above/history of present illness.   PAST MEDICAL HISTORY :  Past Medical History  Diagnosis Date  . Type II or unspecified type diabetes mellitus without mention of complication, uncontrolled   . Other and unspecified hyperlipidemia   . Essential hypertension, benign   . Myocardial infarction ([place])   . Anxiety   . Depression   . Obesity   . Varicosities   . Chronic kidney disease     stage 3  . Prostate cancer ([place])   . [facility])     states he had cancer in his left forearm but is unaware of type and was told his body took care of it  . PONV (postoperative nausea and vomiting)     nausea   . Family history of adverse reaction to anesthesia     adopted    PAST SURGICAL HISTORY :   Past Surgical History  Procedure Laterality Date  . Colonoscopy  2013  . Intraoperative arteriogram      for vascular disease  . Colonoscopy with propofol N/A 01/23/2015    Procedure: COLONOSCOPY WITH PROPOFOL;  Surgeon: Hulen Luster, MD;  Location: [facility] ENDOSCOPY;  Service: Gastroenterology;  Laterality: N/A;  . Tonsillectomy    . Cardiac catheterization    . Total hip arthroplasty Right 05/01/2015    Procedure: TOTAL HIP ARTHROPLASTY ANTERIOR APPROACH;  Surgeon: Hessie Knows, MD;  Location: [facility] ORS;  Service: Orthopedics;  Laterality: Right;    FAMILY HISTORY :   Family History  Problem Relation Age of Onset  . Adopted: Yes    SOCIAL HISTORY:   Social History  Substance Use Topics  . Smoking status: Current Every Day Smoker -- 0.50 packs/day    Types: Cigarettes  . Smokeless tobacco: Never Used  . Alcohol Use: 0.0 oz/week    0 Standard drinks or equivalent per week     Comment: 28 12 ounce beers per week    ALLERGIES:  is allergic to codeine sulfate and prednisone.  MEDICATIONS:  Current Outpatient Prescriptions  Medication Sig Dispense Refill  . ALPRAZolam (XANAX) 0.5 MG tablet Take by mouth.    Marland Kitchen amLODipine (NORVASC) 10 MG tablet Take 10 mg by mouth daily.    Marland Kitchen  ANUCORT-HC 25 MG suppository Place 25 mg rectally daily as needed.     Marland Kitchen atenolol (TENORMIN) 100 MG tablet Take 100 mg by mouth daily.    . Cholecalciferol (VITAMIN D) 2000 units tablet     . Cyanocobalamin (RA VITAMIN B-12 TR) 1000 MCG TBCR Take by mouth.    . dicyclomine (BENTYL) 10 MG capsule Take 10 mg by mouth 4 (four) times daily -  before meals and at bedtime.    Marland Kitchen glimepiride (AMARYL) 4 MG tablet Take by mouth.    . isosorbide mononitrate (IMDUR) 30 MG 24 hr tablet Take 30 mg by mouth daily.    Marland Kitchen lisinopril (PRINIVIL,ZESTRIL) 20 MG tablet Take 20 mg by mouth daily.     . metFORMIN (GLUCOPHAGE) 1000 MG tablet  Take 1,000 mg by mouth daily with breakfast.     . Multiple Vitamins-Minerals (MEGA MULTI MEN PO) Take 1 tablet by mouth daily.     . predniSONE (DELTASONE) 5 MG tablet Take by mouth.    . sertraline (ZOLOFT) 50 MG tablet Take 1 tablet (50 mg total) by mouth daily. 30 tablet 3  . simvastatin (ZOCOR) 40 MG tablet Take 40 mg by mouth every evening.    . tamsulosin (FLOMAX) 0.4 MG CAPS capsule Take 1 capsule (0.4 mg total) by mouth daily after supper. 30 capsule 6  . tamsulosin (FLOMAX) 0.4 MG CAPS capsule Take 1 capsule (0.4 mg total) by mouth daily after supper. 30 capsule 11  . Vitamin D, Ergocalciferol, (DRISDOL) 50000 units CAPS capsule Take 1 capsule by mouth once a week.    Marland Kitchen ZYTIGA 250 MG tablet Take 4 tablets (1,000 mg total) by mouth daily. 120 tablet 12  . gabapentin (NEURONTIN) 100 MG capsule Take by mouth.    . morphine 10 MG/5ML solution Take by mouth.     No current facility-administered medications for this visit.    PHYSICAL EXAMINATION: ECOG PERFORMANCE STATUS: 2 - Symptomatic, <50% confined to bed  BP 132/74 mmHg  Pulse 52  Temp(Src) 98.5 F (36.9 C) (Tympanic)  Resp 18  Wt 207 lb 10.8 oz (94.2 kg)  Filed Weights   10/24/15 1403  Weight: 207 lb 10.8 oz (94.2 kg)    GENERAL: Well-nourished well-developed; Alert, no distress and comfortable.  He can sit on exam table by himself.  Obese.  He is Alone. EYES: no pallor or icterus OROPHARYNX: no thrush or ulceration; good dentition  NECK: supple, no masses felt LYMPH:  no palpable lymphadenopathy in the cervical, axillary or inguinal regions LUNGS: clear to auscultation and  No wheeze or crackles HEART/CVS: regular rate & rhythm and no murmurs; No lower extremity edema ABDOMEN:abdomen soft, non-tender and normal bowel sounds Musculoskeletal:no cyanosis of digits and no clubbing  PSYCH: alert & oriented x 3 with fluent speech NEURO: no focal motor/sensory deficits SKIN:  no rashes or significant lesions  LABORATORY  DATA:  I have reviewed the data as listed    Component Value Date/Time   NA 135 10/24/2015 1300   K 3.6 10/24/2015 1300   CL 101 10/24/2015 1300   CO2 25 10/24/2015 1300   GLUCOSE 190* 10/24/2015 1300   BUN 16 10/24/2015 1300   CREATININE 0.97 10/24/2015 1300   CALCIUM 8.9 10/24/2015 1300   PROT 6.6 10/24/2015 1300   ALBUMIN 3.7 10/24/2015 1300   AST 19 10/24/2015 1300   ALT 14* 10/24/2015 1300   ALKPHOS 74 10/24/2015 1300   BILITOT 0.8 10/24/2015 1300   GFRNONAA >60 10/24/2015 1300  GFRAA >60 10/24/2015 1300    No results found for: SPEP, UPEP  Lab Results  Component Value Date   WBC 7.4 10/24/2015   NEUTROABS 5.3 10/24/2015   HGB 13.3 10/24/2015   HCT 37.6* 10/24/2015   MCV 92.7 10/24/2015   PLT 278 10/24/2015      Chemistry      Component Value Date/Time   NA 135 10/24/2015 1300   K 3.6 10/24/2015 1300   CL 101 10/24/2015 1300   CO2 25 10/24/2015 1300   BUN 16 10/24/2015 1300   CREATININE 0.97 10/24/2015 1300      Component Value Date/Time   CALCIUM 8.9 10/24/2015 1300   ALKPHOS 74 10/24/2015 1300   AST 19 10/24/2015 1300   ALT 14* 10/24/2015 1300   BILITOT 0.8 10/24/2015 1300       RADIOGRAPHIC STUDIES:   ASSESSMENT & PLAN:   # METASTATIC CASTRATE RESISTANT PROSTATE CANCER currently on first line therapy with Zytiga plus prednisone since July 2016/Lupron every 4 months.  PSA April 2017-7; ;chest abdomen pelvis- Improving  retroperitoneal adenopathy; however bone scan shows questionable new right femoral /right iliac lesions. I discussed the role of Taxotere every 3 weeks discussed the potential side effects of fatigue diarrhea neuropathy with Taxotere. I would watch his PSA closely if it continues to rapidly go up and recommend starting chemotherapy. Otherwise watch the patient closely on Zytiga.  # Patient on Zytiga 4 pills a day plus prednisone 5 mg [because of diabetes]; He'll get Lupron today. No major untoward side effects noted- except for  fatigue [C discussion below]  #  Xgeva  Every 16 weeks; patient is on calcium and vitamin D; Xgeva today.   # Fatigue- check TSH- likely from testosterone suppression/ question depression.  # blood in stools- Sep 2016 colo; # Hemorroidal bleed- recommend anusol. Hemoglobin stable. Unlikely related to Zytiga.  #  Depression-  Patient is on Zoloft increase the dose to 50 mg once a day.  #  Patient follow-up- in 2 months/PSA/labs.    Cammie Sickle, MD 10/24/2015 2:31 PM

## 2015-10-25 LAB — TESTOSTERONE

## 2015-11-04 ENCOUNTER — Telehealth: Payer: Self-pay | Admitting: Internal Medicine

## 2015-11-04 DIAGNOSIS — C61 Malignant neoplasm of prostate: Secondary | ICD-10-CM

## 2015-11-04 NOTE — Telephone Encounter (Signed)
Per md order-msg sent to cancer center scheduling to change apt in August to July 20'th. Labs will need to be drawn a few days prior to md apt.

## 2015-11-04 NOTE — Telephone Encounter (Signed)
I Spoke to patient regarding elevated PSA; recommend follow-up in one month; discussed regarding plan for upcoming chemotherapy.   Heather please move his appt/labs to 1 month from now. Thx

## 2015-11-04 NOTE — Addendum Note (Signed)
Addended by: Sabino Gasser on: 11/04/2015 02:18 PM   Modules accepted: Orders

## 2015-11-17 ENCOUNTER — Telehealth: Payer: Self-pay | Admitting: *Deleted

## 2015-11-17 NOTE — Telephone Encounter (Signed)
Received fax from [facility]. Mail order pharmacy has made multiple attempts to contact patient to fill his zytiga.  RN attempted to call patient. Left vm asking patient contact [facility] asap to arrange shipment for his zytiga so that no doses are missed.  Phone number provided [phone].  I also asked patient to contact cancer asap to let us know that this msg was rcvd.

## 2015-12-02 ENCOUNTER — Inpatient Hospital Stay: Payer: 59

## 2015-12-02 DIAGNOSIS — E785 Hyperlipidemia, unspecified: Secondary | ICD-10-CM | POA: Insufficient documentation

## 2015-12-02 DIAGNOSIS — R5383 Other fatigue: Secondary | ICD-10-CM | POA: Insufficient documentation

## 2015-12-02 DIAGNOSIS — F418 Other specified anxiety disorders: Secondary | ICD-10-CM | POA: Diagnosis not present

## 2015-12-02 DIAGNOSIS — E1165 Type 2 diabetes mellitus with hyperglycemia: Secondary | ICD-10-CM | POA: Insufficient documentation

## 2015-12-02 DIAGNOSIS — C771 Secondary and unspecified malignant neoplasm of intrathoracic lymph nodes: Secondary | ICD-10-CM | POA: Insufficient documentation

## 2015-12-02 DIAGNOSIS — C7951 Secondary malignant neoplasm of bone: Secondary | ICD-10-CM | POA: Insufficient documentation

## 2015-12-02 DIAGNOSIS — C61 Malignant neoplasm of prostate: Secondary | ICD-10-CM | POA: Insufficient documentation

## 2015-12-02 DIAGNOSIS — N183 Chronic kidney disease, stage 3 (moderate): Secondary | ICD-10-CM | POA: Insufficient documentation

## 2015-12-02 DIAGNOSIS — I129 Hypertensive chronic kidney disease with stage 1 through stage 4 chronic kidney disease, or unspecified chronic kidney disease: Secondary | ICD-10-CM | POA: Insufficient documentation

## 2015-12-02 DIAGNOSIS — F17210 Nicotine dependence, cigarettes, uncomplicated: Secondary | ICD-10-CM | POA: Insufficient documentation

## 2015-12-02 DIAGNOSIS — Z79899 Other long term (current) drug therapy: Secondary | ICD-10-CM | POA: Diagnosis not present

## 2015-12-02 DIAGNOSIS — Z7984 Long term (current) use of oral hypoglycemic drugs: Secondary | ICD-10-CM | POA: Insufficient documentation

## 2015-12-02 DIAGNOSIS — E669 Obesity, unspecified: Secondary | ICD-10-CM | POA: Insufficient documentation

## 2015-12-02 DIAGNOSIS — F329 Major depressive disorder, single episode, unspecified: Secondary | ICD-10-CM | POA: Insufficient documentation

## 2015-12-02 DIAGNOSIS — I252 Old myocardial infarction: Secondary | ICD-10-CM | POA: Diagnosis not present

## 2015-12-02 DIAGNOSIS — R197 Diarrhea, unspecified: Secondary | ICD-10-CM | POA: Insufficient documentation

## 2015-12-02 LAB — COMPREHENSIVE METABOLIC PANEL
ALK PHOS: 82 U/L (ref 38–126)
ALT: 17 U/L (ref 17–63)
ANION GAP: 10 (ref 5–15)
AST: 16 U/L (ref 15–41)
Albumin: 3.9 g/dL (ref 3.5–5.0)
BILIRUBIN TOTAL: 0.5 mg/dL (ref 0.3–1.2)
BUN: 19 mg/dL (ref 6–20)
CALCIUM: 8.9 mg/dL (ref 8.9–10.3)
CO2: 21 mmol/L — ABNORMAL LOW (ref 22–32)
Chloride: 103 mmol/L (ref 101–111)
Creatinine, Ser: 0.93 mg/dL (ref 0.61–1.24)
GFR calc non Af Amer: >60 mL/min (ref >60)
Glucose, Bld: 162 mg/dL — ABNORMAL HIGH (ref 65–99)
Potassium: 4.1 mmol/L (ref 3.5–5.1)
SODIUM: 134 mmol/L — AB (ref 135–145)
TOTAL PROTEIN: 7.1 g/dL (ref 6.5–8.1)

## 2015-12-02 LAB — CBC WITH DIFFERENTIAL/PLATELET
Basophils Absolute: 0.1 10*3/uL (ref 0–0.1)
Basophils Relative: 1 %
EOS ABS: 0.3 10*3/uL (ref 0–0.7)
Eosinophils Relative: 4 %
HEMATOCRIT: 39.6 % — AB (ref 40.0–52.0)
HEMOGLOBIN: 13.8 g/dL (ref 13.0–18.0)
LYMPHS ABS: 0.8 10*3/uL — AB (ref 1.0–3.6)
Lymphocytes Relative: 11 %
MCH: 32.3 pg (ref 26.0–34.0)
MCHC: 34.9 g/dL (ref 32.0–36.0)
MCV: 92.6 fL (ref 80.0–100.0)
MONO ABS: 0.7 10*3/uL (ref 0.2–1.0)
MONOS PCT: 9 %
NEUTROS PCT: 75 %
Neutro Abs: 5.6 10*3/uL (ref 1.4–6.5)
Platelets: 277 10*3/uL (ref 150–440)
RBC: 4.27 MIL/uL — ABNORMAL LOW (ref 4.40–5.90)
RDW: 14.4 % (ref 11.5–14.5)
WBC: 7.5 10*3/uL (ref 3.8–10.6)

## 2015-12-02 LAB — PSA: PSA: 43.69 ng/mL — ABNORMAL HIGH (ref 0.00–4.00)

## 2015-12-04 ENCOUNTER — Inpatient Hospital Stay: Payer: 59 | Attending: Internal Medicine | Admitting: Internal Medicine

## 2015-12-04 VITALS — BP 138/78 | HR 60 | Temp 98.9°F | Resp 18 | Wt 210.5 lb

## 2015-12-04 DIAGNOSIS — C7951 Secondary malignant neoplasm of bone: Secondary | ICD-10-CM | POA: Diagnosis not present

## 2015-12-04 DIAGNOSIS — F329 Major depressive disorder, single episode, unspecified: Secondary | ICD-10-CM

## 2015-12-04 DIAGNOSIS — C61 Malignant neoplasm of prostate: Secondary | ICD-10-CM | POA: Diagnosis not present

## 2015-12-04 DIAGNOSIS — R5383 Other fatigue: Secondary | ICD-10-CM

## 2015-12-04 DIAGNOSIS — I252 Old myocardial infarction: Secondary | ICD-10-CM

## 2015-12-04 DIAGNOSIS — Z79899 Other long term (current) drug therapy: Secondary | ICD-10-CM

## 2015-12-04 DIAGNOSIS — F418 Other specified anxiety disorders: Secondary | ICD-10-CM

## 2015-12-04 DIAGNOSIS — Z7984 Long term (current) use of oral hypoglycemic drugs: Secondary | ICD-10-CM

## 2015-12-04 DIAGNOSIS — R197 Diarrhea, unspecified: Secondary | ICD-10-CM | POA: Diagnosis not present

## 2015-12-04 DIAGNOSIS — E669 Obesity, unspecified: Secondary | ICD-10-CM

## 2015-12-04 DIAGNOSIS — C771 Secondary and unspecified malignant neoplasm of intrathoracic lymph nodes: Secondary | ICD-10-CM

## 2015-12-04 DIAGNOSIS — I129 Hypertensive chronic kidney disease with stage 1 through stage 4 chronic kidney disease, or unspecified chronic kidney disease: Secondary | ICD-10-CM

## 2015-12-04 DIAGNOSIS — E1165 Type 2 diabetes mellitus with hyperglycemia: Secondary | ICD-10-CM

## 2015-12-04 DIAGNOSIS — N183 Chronic kidney disease, stage 3 (moderate): Secondary | ICD-10-CM

## 2015-12-04 DIAGNOSIS — E785 Hyperlipidemia, unspecified: Secondary | ICD-10-CM

## 2015-12-04 DIAGNOSIS — F17210 Nicotine dependence, cigarettes, uncomplicated: Secondary | ICD-10-CM

## 2015-12-04 MED ORDER — PROCHLORPERAZINE MALEATE 10 MG PO TABS: 10.0000 mg | ORAL_TABLET | Freq: Four times a day (QID) | ORAL | Status: DC | PRN

## 2015-12-04 MED ORDER — ONDANSETRON HCL 8 MG PO TABS: 8.0000 mg | ORAL_TABLET | Freq: Two times a day (BID) | ORAL | Status: DC | PRN

## 2015-12-04 MED ORDER — DEXAMETHASONE 4 MG PO TABS: 4.0000 mg | ORAL_TABLET | Freq: Two times a day (BID) | ORAL | Status: DC

## 2015-12-04 NOTE — Assessment & Plan Note (Addendum)
#   METASTATIC CASTRATE RESISTANT PROSTATE CANCER currently on first line therapy with Zytiga plus prednisone since July 2016/Lupron every 4 months.Rising July 2017-43 from 24 a month ago. Chest abdomen pelvis- Improving retroperitoneal adenopathy; however bone scan shows questionable new right femoral /right iliac lesions.  # Patient has progressive castrate resistant prostate cancer. I recommend discontinuation of Zytiga and prednisone at this time [possible diarrhea].  #  Recommend starting Taxotere every 3 weeks discussed the potential side effects. Start Neulasta.  Growth factor-Neulasta/On pro would be given as prophylaxis for chemotherapy-induced neutropenia to prevent febrile neutropenias. Discussed potential side effect- myalgias/arthralgias- recommend Claritin for 4 days.     I discussed the role of Taxotere every 3 weeks discussed the potential side effects of fatigue diarrhea neuropathy with Taxotere. I would watch his PSA closely if it continues to rapidly go up and recommend starting chemotherapy. Otherwise watch the patient closely on Zytiga.  # Diarrhea-  questions Zytiga. Zytiga. also check C. difficile and stool studies  # v follow-up with me in 2 weeks to start chemotherapy. Premedications given. Recommend steroids prior to chemotherapy.

## 2015-12-04 NOTE — Patient Instructions (Signed)
Docetaxel injection  What is this medicine?  DOCETAXEL (doe se TAX el) is a chemotherapy drug. It targets fast dividing cells, like cancer cells, and causes these cells to die. This medicine is used to treat many types of cancers like breast cancer, certain stomach cancers, head and neck cancer, lung cancer, and prostate cancer.  This medicine may be used for other purposes; ask your health care provider or pharmacist if you have questions.  What should I tell my health care provider before I take this medicine?  They need to know if you have any of these conditions:  -infection (especially a virus infection such as chickenpox, cold sores, or herpes)  -liver disease  -low blood counts, like low white cell, platelet, or red cell counts  -an unusual or allergic reaction to docetaxel, polysorbate 80, other chemotherapy agents, other medicines, foods, dyes, or preservatives  -pregnant or trying to get pregnant  -breast-feeding  How should I use this medicine?  This drug is given as an infusion into a vein. It is administered in a hospital or clinic by a specially trained health care professional.  Talk to your pediatrician regarding the use of this medicine in children. Special care may be needed.  Overdosage: If you think you have taken too much of this medicine contact a poison control center or emergency room at once.  NOTE: This medicine is only for you. Do not share this medicine with others.  What if I miss a dose?  It is important not to miss your dose. Call your doctor or health care professional if you are unable to keep an appointment.  What may interact with this medicine?  -cyclosporine  -erythromycin  -ketoconazole  -medicines to increase blood counts like filgrastim, pegfilgrastim, sargramostim  -vaccines  Talk to your doctor or health care professional before taking any of these medicines:  -acetaminophen  -aspirin  -ibuprofen  -ketoprofen  -naproxen  This list may not describe all possible interactions.  Give your health care provider a list of all the medicines, herbs, non-prescription drugs, or dietary supplements you use. Also tell them if you smoke, drink alcohol, or use illegal drugs. Some items may interact with your medicine.  What should I watch for while using this medicine?  Your condition will be monitored carefully while you are receiving this medicine. You will need important blood work done while you are taking this medicine.  This drug may make you feel generally unwell. This is not uncommon, as chemotherapy can affect healthy cells as well as cancer cells. Report any side effects. Continue your course of treatment even though you feel ill unless your doctor tells you to stop.  In some cases, you may be given additional medicines to help with side effects. Follow all directions for their use.  Call your doctor or health care professional for advice if you get a fever, chills or sore throat, or other symptoms of a cold or flu. Do not treat yourself. This drug decreases your body's ability to fight infections. Try to avoid being around people who are sick.  This medicine may increase your risk to bruise or bleed. Call your doctor or health care professional if you notice any unusual bleeding.  This medicine may contain alcohol in the product. You may get drowsy or dizzy. Do not drive, use machinery, or do anything that needs mental alertness until you know how this medicine affects you. Do not stand or sit up quickly, especially if you are an older   patient. This reduces the risk of dizzy or fainting spells. Avoid alcoholic drinks.  Do not become pregnant while taking this medicine. Women should inform their doctor if they wish to become pregnant or think they might be pregnant. There is a potential for serious side effects to an unborn child. Talk to your health care professional or pharmacist for more information. Do not breast-feed an infant while taking this medicine.  What side effects may I notice  from receiving this medicine?  Side effects that you should report to your doctor or health care professional as soon as possible:  -allergic reactions like skin rash, itching or hives, swelling of the face, lips, or tongue  -low blood counts - This drug may decrease the number of white blood cells, red blood cells and platelets. You may be at increased risk for infections and bleeding.  -signs of infection - fever or chills, cough, sore throat, pain or difficulty passing urine  -signs of decreased platelets or bleeding - bruising, pinpoint red spots on the skin, black, tarry stools, nosebleeds  -signs of decreased red blood cells - unusually weak or tired, fainting spells, lightheadedness  -breathing problems  -fast or irregular heartbeat  -low blood pressure  -mouth sores  -nausea and vomiting  -pain, swelling, redness or irritation at the injection site  -pain, tingling, numbness in the hands or feet  -swelling of the ankle, feet, hands  -weight gain  Side effects that usually do not require medical attention (report to your prescriber or health care professional if they continue or are bothersome):  -bone pain  -complete hair loss including hair on your head, underarms, pubic hair, eyebrows, and eyelashes  -diarrhea  -excessive tearing  -changes in the color of fingernails  -loosening of the fingernails  -nausea  -muscle pain  -red flush to skin  -sweating  -weak or tired  This list may not describe all possible side effects. Call your doctor for medical advice about side effects. You may report side effects to FDA at 1-800-FDA-1088.  Where should I keep my medicine?  This drug is given in a hospital or clinic and will not be stored at home.  NOTE: This sheet is a summary. It may not cover all possible information. If you have questions about this medicine, talk to your doctor, pharmacist, or health care provider.      2016, [employer]. (2014-05-20 16:04:57)

## 2015-12-04 NOTE — Progress Notes (Signed)
Patient states he sometimes sleeps for 2 days and still does not feel rested.  Also states his diarrhea is uncontrolled.  States he is embarrassed to go out.  Has to wear depends.  Has not eaten today because he knew he had an appointment here today.  States he takes imodium and lomotil but nothing seems to be helping him.

## 2015-12-04 NOTE — Progress Notes (Signed)
[place] OFFICE PROGRESS NOTE  Patient Care Team: Tracie Harrier, MD as PCP - General (Internal Medicine)   SUMMARY OF ONCOLOGIC HISTORY:  Oncology History   # 2014- PROSTATE CA [s/p Bx-Gleason score 5+4; s/p EBRT [Dr.Crystal];July 2015- Biochem Relapse [PSA-15]; non-compliance with Lupron; JAN 2016-PSA 50   # MAY 2016- PSA-100/Bone scan- Bone mets [s/p EBRT Right Hip]; CT A/P- PELVIC/RP LN;   # July 2016- ? 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 Patient complains of "significant diarrhea"  For "many years". Patient thinks his diarrhea could be exacerbated from [facility].  His weight is steady. Denies any blood in stools. Denies any worsening pain. Continues to have chronic fatigue. Continues to depressed on Zoloft.   No new swelling in the legs. No shortness of breath.  REVIEW OF SYSTEMS:  A complete 10 point review of system is done which is negative except mentioned above/history of present illness.   PAST MEDICAL HISTORY :  Past Medical History  Diagnosis Date  . Type II or unspecified type diabetes mellitus without mention of complication, uncontrolled   . Other and unspecified hyperlipidemia   . Essential hypertension, benign   . Myocardial infarction ([place])   . Anxiety   . Depression    . Obesity   . Varicosities   . Chronic kidney disease     stage 3  . Prostate cancer ([place])   . Cancer [facility])     states he had cancer in his left forearm but is unaware of type and was told his body took care of it  . PONV (postoperative nausea and vomiting)     nausea  . Family history of adverse reaction to anesthesia     adopted    PAST SURGICAL HISTORY :   Past Surgical History  Procedure Laterality Date  . Colonoscopy  2013  . Intraoperative arteriogram      for vascular disease  . Colonoscopy with propofol N/A 01/23/2015    Procedure: COLONOSCOPY WITH PROPOFOL;  Surgeon: Hulen Luster, MD;  Location: [facility] ENDOSCOPY;  Service: Gastroenterology;  Laterality: N/A;  . Tonsillectomy    . Cardiac catheterization    . Total hip arthroplasty Right 05/01/2015    Procedure: TOTAL HIP ARTHROPLASTY ANTERIOR APPROACH;  Surgeon: Hessie Knows, MD;  Location: [facility] ORS;  Service: Orthopedics;  Laterality: Right;    FAMILY HISTORY :   Family History  Problem Relation Age of Onset  . Adopted: Yes    SOCIAL HISTORY:   Social History  Substance Use Topics  . Smoking status: Current Every Day Smoker -- 0.50 packs/day    Types: Cigarettes  . Smokeless tobacco: Never Used  . Alcohol Use: 0.0 oz/week    0 Standard drinks or equivalent per week     Comment: 28 12 ounce beers per week    ALLERGIES:  is allergic  to codeine sulfate and prednisone.  MEDICATIONS:  Current Outpatient Prescriptions  Medication Sig Dispense Refill  . ALPRAZolam (XANAX) 0.5 MG tablet Take by mouth.    Marland Kitchen amLODipine (NORVASC) 10 MG tablet Take 10 mg by mouth daily.    . ANUCORT-HC 25 MG suppository Place 25 mg rectally daily as needed.     Marland Kitchen atenolol (TENORMIN) 100 MG tablet Take 100 mg by mouth daily.    . Cholecalciferol (VITAMIN D) 2000 units tablet     . Cyanocobalamin (RA VITAMIN B-12 TR) 1000 MCG TBCR Take by mouth.    . dicyclomine (BENTYL) 10 MG capsule Take 10 mg by mouth 4 (four) times daily -  before  meals and at bedtime.    Marland Kitchen glimepiride (AMARYL) 4 MG tablet Take by mouth.    . isosorbide mononitrate (IMDUR) 30 MG 24 hr tablet Take 30 mg by mouth daily.    Marland Kitchen lisinopril (PRINIVIL,ZESTRIL) 20 MG tablet Take 20 mg by mouth daily.     . metFORMIN (GLUCOPHAGE) 1000 MG tablet Take 1,000 mg by mouth daily with breakfast.     . morphine 10 MG/5ML solution Take by mouth.    . Multiple Vitamins-Minerals (MEGA MULTI MEN PO) Take 1 tablet by mouth daily.     . sertraline (ZOLOFT) 100 MG tablet Take by mouth.    . simvastatin (ZOCOR) 40 MG tablet Take 40 mg by mouth every evening.    . tamsulosin (FLOMAX) 0.4 MG CAPS capsule Take 1 capsule (0.4 mg total) by mouth daily after supper. 30 capsule 6  . tamsulosin (FLOMAX) 0.4 MG CAPS capsule Take 1 capsule (0.4 mg total) by mouth daily after supper. 30 capsule 11  . Vitamin D, Ergocalciferol, (DRISDOL) 50000 units CAPS capsule Take 1 capsule by mouth once a week.    Marland Kitchen dexamethasone (DECADRON) 4 MG tablet Take 1 tablet (4 mg total) by mouth 2 (two) times daily. Start the day before Taxotere. Then daily after chemo for 2 days. 30 tablet 1  . gabapentin (NEURONTIN) 100 MG capsule Take by mouth.    . ondansetron (ZOFRAN) 8 MG tablet Take 1 tablet (8 mg total) by mouth 2 (two) times daily as needed for refractory nausea / vomiting. 30 tablet 1  . prochlorperazine (COMPAZINE) 10 MG tablet Take 1 tablet (10 mg total) by mouth every 6 (six) hours as needed (Nausea or vomiting). 30 tablet 1   No current facility-administered medications for this visit.    PHYSICAL EXAMINATION: ECOG PERFORMANCE STATUS: 2 - Symptomatic, <50% confined to bed  BP 138/78 mmHg  Pulse 60  Temp(Src) 98.9 F (37.2 C) (Tympanic)  Resp 18  Wt 210 lb 8 oz (95.482 kg)  Filed Weights   12/04/15 1509  Weight: 210 lb 8 oz (95.482 kg)    GENERAL: Well-nourished well-developed; Alert, no distress and comfortable.  He can sit on exam table by himself.  Obese.  He is Alone. EYES: no  pallor or icterus OROPHARYNX: no thrush or ulceration; good dentition  NECK: supple, no masses felt LYMPH:  no palpable lymphadenopathy in the cervical, axillary or inguinal regions LUNGS: clear to auscultation and  No wheeze or crackles HEART/CVS: regular rate & rhythm and no murmurs; No lower extremity edema ABDOMEN:abdomen soft, non-tender and normal bowel sounds Musculoskeletal:no cyanosis of digits and no clubbing  PSYCH: alert & oriented x 3 with fluent speech NEURO: no focal motor/sensory deficits SKIN:  no rashes or significant lesions  LABORATORY DATA:  I have reviewed the data  as listed    Component Value Date/Time   NA 134* 12/02/2015 1440   K 4.1 12/02/2015 1440   CL 103 12/02/2015 1440   CO2 21* 12/02/2015 1440   GLUCOSE 162* 12/02/2015 1440   BUN 19 12/02/2015 1440   CREATININE 0.93 12/02/2015 1440   CALCIUM 8.9 12/02/2015 1440   PROT 7.1 12/02/2015 1440   ALBUMIN 3.9 12/02/2015 1440   AST 16 12/02/2015 1440   ALT 17 12/02/2015 1440   ALKPHOS 82 12/02/2015 1440   BILITOT 0.5 12/02/2015 1440   GFRNONAA >60 12/02/2015 1440   GFRAA >60 12/02/2015 1440    No results found for: SPEP, UPEP  Lab Results  Component Value Date   WBC 7.5 12/02/2015   NEUTROABS 5.6 12/02/2015   HGB 13.8 12/02/2015   HCT 39.6* 12/02/2015   MCV 92.6 12/02/2015   PLT 277 12/02/2015      Chemistry      Component Value Date/Time   NA 134* 12/02/2015 1440   K 4.1 12/02/2015 1440   CL 103 12/02/2015 1440   CO2 21* 12/02/2015 1440   BUN 19 12/02/2015 1440   CREATININE 0.93 12/02/2015 1440      Component Value Date/Time   CALCIUM 8.9 12/02/2015 1440   ALKPHOS 82 12/02/2015 1440   AST 16 12/02/2015 1440   ALT 17 12/02/2015 1440   BILITOT 0.5 12/02/2015 1440       RADIOGRAPHIC STUDIES:   ASSESSMENT & PLAN:   Malignant neoplasm of prostate ([place]) # METASTATIC CASTRATE RESISTANT PROSTATE CANCER currently on first line therapy with Zytiga plus prednisone since July  2016/Lupron every 4 months.Rising July 2017-43 from 24 a month ago. Chest abdomen pelvis- Improving retroperitoneal adenopathy; however bone scan shows questionable new right femoral /right iliac lesions.  # Patient has progressive castrate resistant prostate cancer. I recommend discontinuation of Zytiga and prednisone at this time [possible diarrhea].  #  Recommend starting Taxotere every 3 weeks discussed the potential side effects. Start Neulasta.  Growth factor-Neulasta/On pro would be given as prophylaxis for chemotherapy-induced neutropenia to prevent febrile neutropenias. Discussed potential side effect- myalgias/arthralgias- recommend Claritin for 4 days.     I discussed the role of Taxotere every 3 weeks discussed the potential side effects of fatigue diarrhea neuropathy with Taxotere. I would watch his PSA closely if it continues to rapidly go up and recommend starting chemotherapy. Otherwise watch the patient closely on Zytiga.  # Diarrhea-  questions Zytiga. Zytiga. also check C. difficile and stool studies  # v follow-up with me in 2 weeks to start chemotherapy. Premedications given. Recommend steroids prior to chemotherapy.        Cammie Sickle, MD 12/04/2015 4:48 PM

## 2015-12-05 ENCOUNTER — Telehealth: Payer: Self-pay | Admitting: *Deleted

## 2015-12-05 NOTE — Telephone Encounter (Signed)
Called [facility] to inform them patient will be changing therapy, therefore, advised them not to refill his zytiga.  Per Delsa Sale patient placed on inactive status.

## 2015-12-05 NOTE — Telephone Encounter (Signed)
Sean Petersen, patient did not go to lab after leaving yesterday to pick up stool collection kit.  We need to get in touch with him on Monday to ask him to come by cancer center to pick up kit for stool culture and to r/o c-diff.

## 2015-12-05 NOTE — Telephone Encounter (Signed)
Due to change of therapy, we need to contact [place]. Mail order pharmacy -- not to refill his zytiga. Phone number provided [phone].   Nira Conn

## 2015-12-08 NOTE — Telephone Encounter (Signed)
Called patient and LVM that he should come to [facility] lab to pick up stool cards.  He was instructed to do so before he left appointment on Thursday.

## 2015-12-09 ENCOUNTER — Inpatient Hospital Stay: Payer: 59

## 2015-12-09 ENCOUNTER — Other Ambulatory Visit: Payer: Self-pay | Admitting: *Deleted

## 2015-12-09 NOTE — Patient Instructions (Signed)
Docetaxel injection  What is this medicine?  DOCETAXEL (doe se TAX el) is a chemotherapy drug. It targets fast dividing cells, like cancer cells, and causes these cells to die. This medicine is used to treat many types of cancers like breast cancer, certain stomach cancers, head and neck cancer, lung cancer, and prostate cancer.  This medicine may be used for other purposes; ask your health care provider or pharmacist if you have questions.  What should I tell my health care provider before I take this medicine?  They need to know if you have any of these conditions:  -infection (especially a virus infection such as chickenpox, cold sores, or herpes)  -liver disease  -low blood counts, like low white cell, platelet, or red cell counts  -an unusual or allergic reaction to docetaxel, polysorbate 80, other chemotherapy agents, other medicines, foods, dyes, or preservatives  -pregnant or trying to get pregnant  -breast-feeding  How should I use this medicine?  This drug is given as an infusion into a vein. It is administered in a hospital or clinic by a specially trained health care professional.  Talk to your pediatrician regarding the use of this medicine in children. Special care may be needed.  Overdosage: If you think you have taken too much of this medicine contact a poison control center or emergency room at once.  NOTE: This medicine is only for you. Do not share this medicine with others.  What if I miss a dose?  It is important not to miss your dose. Call your doctor or health care professional if you are unable to keep an appointment.  What may interact with this medicine?  -cyclosporine  -erythromycin  -ketoconazole  -medicines to increase blood counts like filgrastim, pegfilgrastim, sargramostim  -vaccines  Talk to your doctor or health care professional before taking any of these medicines:  -acetaminophen  -aspirin  -ibuprofen  -ketoprofen  -naproxen  This list may not describe all possible interactions.  Give your health care provider a list of all the medicines, herbs, non-prescription drugs, or dietary supplements you use. Also tell them if you smoke, drink alcohol, or use illegal drugs. Some items may interact with your medicine.  What should I watch for while using this medicine?  Your condition will be monitored carefully while you are receiving this medicine. You will need important blood work done while you are taking this medicine.  This drug may make you feel generally unwell. This is not uncommon, as chemotherapy can affect healthy cells as well as cancer cells. Report any side effects. Continue your course of treatment even though you feel ill unless your doctor tells you to stop.  In some cases, you may be given additional medicines to help with side effects. Follow all directions for their use.  Call your doctor or health care professional for advice if you get a fever, chills or sore throat, or other symptoms of a cold or flu. Do not treat yourself. This drug decreases your body's ability to fight infections. Try to avoid being around people who are sick.  This medicine may increase your risk to bruise or bleed. Call your doctor or health care professional if you notice any unusual bleeding.  This medicine may contain alcohol in the product. You may get drowsy or dizzy. Do not drive, use machinery, or do anything that needs mental alertness until you know how this medicine affects you. Do not stand or sit up quickly, especially if you are an older   patient. This reduces the risk of dizzy or fainting spells. Avoid alcoholic drinks.  Do not become pregnant while taking this medicine. Women should inform their doctor if they wish to become pregnant or think they might be pregnant. There is a potential for serious side effects to an unborn child. Talk to your health care professional or pharmacist for more information. Do not breast-feed an infant while taking this medicine.  What side effects may I notice  from receiving this medicine?  Side effects that you should report to your doctor or health care professional as soon as possible:  -allergic reactions like skin rash, itching or hives, swelling of the face, lips, or tongue  -low blood counts - This drug may decrease the number of white blood cells, red blood cells and platelets. You may be at increased risk for infections and bleeding.  -signs of infection - fever or chills, cough, sore throat, pain or difficulty passing urine  -signs of decreased platelets or bleeding - bruising, pinpoint red spots on the skin, black, tarry stools, nosebleeds  -signs of decreased red blood cells - unusually weak or tired, fainting spells, lightheadedness  -breathing problems  -fast or irregular heartbeat  -low blood pressure  -mouth sores  -nausea and vomiting  -pain, swelling, redness or irritation at the injection site  -pain, tingling, numbness in the hands or feet  -swelling of the ankle, feet, hands  -weight gain  Side effects that usually do not require medical attention (report to your prescriber or health care professional if they continue or are bothersome):  -bone pain  -complete hair loss including hair on your head, underarms, pubic hair, eyebrows, and eyelashes  -diarrhea  -excessive tearing  -changes in the color of fingernails  -loosening of the fingernails  -nausea  -muscle pain  -red flush to skin  -sweating  -weak or tired  This list may not describe all possible side effects. Call your doctor for medical advice about side effects. You may report side effects to FDA at 1-800-FDA-1088.  Where should I keep my medicine?  This drug is given in a hospital or clinic and will not be stored at home.  NOTE: This sheet is a summary. It may not cover all possible information. If you have questions about this medicine, talk to your doctor, pharmacist, or health care provider.      2016, [employer]. (2014-05-20 16:04:57)

## 2015-12-18 ENCOUNTER — Inpatient Hospital Stay (HOSPITAL_BASED_OUTPATIENT_CLINIC_OR_DEPARTMENT_OTHER): Payer: 59 | Admitting: Internal Medicine

## 2015-12-18 ENCOUNTER — Inpatient Hospital Stay: Payer: 59 | Attending: Internal Medicine

## 2015-12-18 ENCOUNTER — Other Ambulatory Visit: Payer: Self-pay

## 2015-12-18 ENCOUNTER — Inpatient Hospital Stay: Payer: 59

## 2015-12-18 VITALS — BP 154/89 | HR 60 | Temp 98.9°F | Resp 18 | Wt 210.2 lb

## 2015-12-18 VITALS — BP 152/88 | HR 50

## 2015-12-18 DIAGNOSIS — E785 Hyperlipidemia, unspecified: Secondary | ICD-10-CM | POA: Insufficient documentation

## 2015-12-18 DIAGNOSIS — E119 Type 2 diabetes mellitus without complications: Secondary | ICD-10-CM

## 2015-12-18 DIAGNOSIS — I252 Old myocardial infarction: Secondary | ICD-10-CM

## 2015-12-18 DIAGNOSIS — C61 Malignant neoplasm of prostate: Secondary | ICD-10-CM | POA: Insufficient documentation

## 2015-12-18 DIAGNOSIS — Z79899 Other long term (current) drug therapy: Secondary | ICD-10-CM

## 2015-12-18 DIAGNOSIS — F419 Anxiety disorder, unspecified: Secondary | ICD-10-CM | POA: Diagnosis not present

## 2015-12-18 DIAGNOSIS — F17210 Nicotine dependence, cigarettes, uncomplicated: Secondary | ICD-10-CM | POA: Insufficient documentation

## 2015-12-18 DIAGNOSIS — C7951 Secondary malignant neoplasm of bone: Secondary | ICD-10-CM

## 2015-12-18 DIAGNOSIS — C771 Secondary and unspecified malignant neoplasm of intrathoracic lymph nodes: Secondary | ICD-10-CM | POA: Diagnosis not present

## 2015-12-18 DIAGNOSIS — I129 Hypertensive chronic kidney disease with stage 1 through stage 4 chronic kidney disease, or unspecified chronic kidney disease: Secondary | ICD-10-CM

## 2015-12-18 DIAGNOSIS — Z5111 Encounter for antineoplastic chemotherapy: Secondary | ICD-10-CM | POA: Diagnosis not present

## 2015-12-18 DIAGNOSIS — N183 Chronic kidney disease, stage 3 (moderate): Secondary | ICD-10-CM

## 2015-12-18 DIAGNOSIS — R197 Diarrhea, unspecified: Secondary | ICD-10-CM | POA: Diagnosis not present

## 2015-12-18 DIAGNOSIS — Z7984 Long term (current) use of oral hypoglycemic drugs: Secondary | ICD-10-CM

## 2015-12-18 DIAGNOSIS — R5383 Other fatigue: Secondary | ICD-10-CM

## 2015-12-18 DIAGNOSIS — E669 Obesity, unspecified: Secondary | ICD-10-CM | POA: Insufficient documentation

## 2015-12-18 DIAGNOSIS — Z7689 Persons encountering health services in other specified circumstances: Secondary | ICD-10-CM

## 2015-12-18 LAB — GASTROINTESTINAL PANEL BY PCR, STOOL (REPLACES STOOL CULTURE)
ADENOVIRUS F40/41: NOT DETECTED
ASTROVIRUS: NOT DETECTED
CAMPYLOBACTER SPECIES: NOT DETECTED
Cryptosporidium: NOT DETECTED
Cyclospora cayetanensis: NOT DETECTED
E. COLI O157: NOT DETECTED
ENTEROPATHOGENIC E COLI (EPEC): NOT DETECTED
ENTEROTOXIGENIC E COLI (ETEC): NOT DETECTED
Entamoeba histolytica: NOT DETECTED
Enteroaggregative E coli (EAEC): NOT DETECTED
Giardia lamblia: NOT DETECTED
Norovirus GI/GII: NOT DETECTED
PLESIMONAS SHIGELLOIDES: NOT DETECTED
ROTAVIRUS A: NOT DETECTED
SAPOVIRUS (I, II, IV, AND V): NOT DETECTED
SHIGA LIKE TOXIN PRODUCING E COLI (STEC): NOT DETECTED
Salmonella species: NOT DETECTED
Shigella/Enteroinvasive E coli (EIEC): NOT DETECTED
Vibrio cholerae: NOT DETECTED
Vibrio species: NOT DETECTED
Yersinia enterocolitica: NOT DETECTED

## 2015-12-18 LAB — CBC WITH DIFFERENTIAL/PLATELET
Basophils Absolute: 0 10*3/uL (ref 0–0.1)
Basophils Relative: 0 %
EOS ABS: 0.2 10*3/uL (ref 0–0.7)
EOS PCT: 3 %
HCT: 40.5 % (ref 40.0–52.0)
Hemoglobin: 14.2 g/dL (ref 13.0–18.0)
LYMPHS ABS: 0.9 10*3/uL — AB (ref 1.0–3.6)
LYMPHS PCT: 10 %
MCH: 32.7 pg (ref 26.0–34.0)
MCHC: 35 g/dL (ref 32.0–36.0)
MCV: 93.4 fL (ref 80.0–100.0)
MONO ABS: 0.8 10*3/uL (ref 0.2–1.0)
Monocytes Relative: 9 %
Neutro Abs: 6.8 10*3/uL — ABNORMAL HIGH (ref 1.4–6.5)
Neutrophils Relative %: 78 %
PLATELETS: 216 10*3/uL (ref 150–440)
RBC: 4.34 MIL/uL — AB (ref 4.40–5.90)
RDW: 14.5 % (ref 11.5–14.5)
WBC: 8.8 10*3/uL (ref 3.8–10.6)

## 2015-12-18 LAB — COMPREHENSIVE METABOLIC PANEL
ALT: 29 U/L (ref 17–63)
ANION GAP: 9 (ref 5–15)
AST: 33 U/L (ref 15–41)
Albumin: 4 g/dL (ref 3.5–5.0)
Alkaline Phosphatase: 82 U/L (ref 38–126)
BUN: 23 mg/dL — ABNORMAL HIGH (ref 6–20)
CALCIUM: 10.1 mg/dL (ref 8.9–10.3)
CHLORIDE: 102 mmol/L (ref 101–111)
CO2: 25 mmol/L (ref 22–32)
CREATININE: 0.94 mg/dL (ref 0.61–1.24)
Glucose, Bld: 208 mg/dL — ABNORMAL HIGH (ref 65–99)
Potassium: 4.9 mmol/L (ref 3.5–5.1)
SODIUM: 136 mmol/L (ref 135–145)
Total Bilirubin: 0.5 mg/dL (ref 0.3–1.2)
Total Protein: 7.1 g/dL (ref 6.5–8.1)

## 2015-12-18 LAB — C DIFFICILE QUICK SCREEN W PCR REFLEX
C DIFFICILE (CDIFF) INTERP: NOT DETECTED
C DIFFICILE (CDIFF) TOXIN: NEGATIVE
C DIFFICLE (CDIFF) ANTIGEN: NEGATIVE

## 2015-12-18 MED ORDER — DOCETAXEL CHEMO INJECTION 160 MG/16ML
60.0000 mg/m2 | Freq: Once | INTRAVENOUS | Status: AC
  Administered 2015-12-18: 130 mg via INTRAVENOUS
  Filled 2015-12-18: qty 13

## 2015-12-18 MED ORDER — HEPARIN SOD (PORK) LOCK FLUSH 100 UNIT/ML IV SOLN: 500.0000 [IU] | Freq: Once | INTRAVENOUS | Status: DC | PRN

## 2015-12-18 MED ORDER — SODIUM CHLORIDE 0.9 % IV SOLN
Freq: Once | INTRAVENOUS | Status: AC
  Administered 2015-12-18: 10:00:00 via INTRAVENOUS
  Filled 2015-12-18: qty 1000

## 2015-12-18 MED ORDER — SODIUM CHLORIDE 0.9% FLUSH
10.0000 mL | INTRAVENOUS | Status: DC | PRN
  Filled 2015-12-18: qty 10

## 2015-12-18 MED ORDER — PEGFILGRASTIM 6 MG/0.6ML ~~LOC~~ PSKT
6.0000 mg | PREFILLED_SYRINGE | Freq: Once | SUBCUTANEOUS | Status: AC
  Administered 2015-12-18: 6 mg via SUBCUTANEOUS
  Filled 2015-12-18: qty 0.6

## 2015-12-18 MED ORDER — SODIUM CHLORIDE 0.9 % IV SOLN
10.0000 mg | Freq: Once | INTRAVENOUS | Status: AC
  Administered 2015-12-18: 10 mg via INTRAVENOUS
  Filled 2015-12-18: qty 1

## 2015-12-18 NOTE — Patient Instructions (Signed)
Reminder: Since you did not take your oral steriods last evening, it is imperative that you take these today as directed by Dr. Rogue Bussing.   Decadron 4 mg tablets:  Instructions.  Take 1 tablet (4 mg total) by mouth 2 (two) times daily. Start the day before Taxotere. Then daily after chemo for 2 days. - Oral

## 2015-12-18 NOTE — Progress Notes (Signed)
[place] OFFICE PROGRESS NOTE  Patient Care Team: Tracie Harrier, MD as PCP - General (Internal Medicine)   SUMMARY OF ONCOLOGIC HISTORY:  Oncology History   # 2014- PROSTATE CA [s/p Bx-Gleason score 5+4; s/p EBRT [Dr.Crystal];July 2015- Biochem Relapse [PSA-15]; non-compliance with Lupron; JAN 2016-PSA 50   # MAY 2016- PSA-100/Bone scan- Bone mets [s/p EBRT Right Hip]; CT A/P- PELVIC/RP LN;   # July 2016- ? 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   Patient complains of "significant diarrhea"  For "many years". This has not improved even after stopping Zytiga. His weight is steady. Denies any blood in stools. Denies any worsening pain. Continues to have chronic fatigue.   Patient is here to start Taxotere cycle #1.   No new swelling in the legs. No shortness of breath.  REVIEW OF SYSTEMS:  A complete 10 point review of system is done which is negative except mentioned above/history of present illness.   PAST MEDICAL HISTORY :  Past Medical History:  Diagnosis Date  . Anxiety   . Cancer [facility])    states he had cancer in his left forearm but is unaware of type and was told his body took care of it  .  Chronic kidney disease    stage 3  . Depression   . Essential hypertension, benign   . Family history of adverse reaction to anesthesia    adopted  . Myocardial infarction ([place])   . Obesity   . Other and unspecified hyperlipidemia   . PONV (postoperative nausea and vomiting)    nausea  . Prostate cancer ([place])   . Type II or unspecified type diabetes mellitus without mention of complication, uncontrolled   . Varicosities     PAST SURGICAL HISTORY :   Past Surgical History:  Procedure Laterality Date  . CARDIAC CATHETERIZATION    . COLONOSCOPY  2013  . COLONOSCOPY WITH PROPOFOL N/A 01/23/2015   Procedure: COLONOSCOPY WITH PROPOFOL;  Surgeon: Hulen Luster, MD;  Location: [facility] ENDOSCOPY;  Service: Gastroenterology;  Laterality: N/A;  . INTRAOPERATIVE ARTERIOGRAM     for vascular disease  . TONSILLECTOMY    . TOTAL HIP ARTHROPLASTY Right 05/01/2015   Procedure: TOTAL HIP ARTHROPLASTY ANTERIOR APPROACH;  Surgeon: Hessie Knows, MD;  Location: [facility] ORS;  Service: Orthopedics;  Laterality: Right;    FAMILY HISTORY :   Family History  Problem Relation Age of Onset  . Adopted: Yes    SOCIAL HISTORY:   Social History  Substance Use Topics  . Smoking status: Current Every Day Smoker    Packs/day: 0.50    Types: Cigarettes  . Smokeless tobacco: Never Used  . Alcohol use 0.0 oz/week     Comment: 28  12 ounce beers per week    ALLERGIES:  is allergic to codeine sulfate and prednisone.  MEDICATIONS:  Current Outpatient Prescriptions  Medication Sig Dispense Refill  . ALPRAZolam (XANAX) 0.5 MG tablet Take by mouth.    Marland Kitchen amLODipine (NORVASC) 10 MG tablet Take 10 mg by mouth daily.    Marland Kitchen atenolol (TENORMIN) 100 MG tablet Take 100 mg by mouth daily.    . Cholecalciferol (VITAMIN D) 2000 units tablet     . Cyanocobalamin (RA VITAMIN B-12 TR) 1000 MCG TBCR Take by mouth.    . dexamethasone (DECADRON) 4 MG tablet Take 4 mg by mouth 2 (two) times daily with a meal. Take 1 tablet (4 mg total)  by mouth 2 (two) times daily. Start the day before Taxotere. Then daily after chemo for 2 days. - Oral    . glimepiride (AMARYL) 4 MG tablet Take by mouth.    . isosorbide mononitrate (IMDUR) 30 MG 24 hr tablet Take 30 mg by mouth daily.    Marland Kitchen lisinopril (PRINIVIL,ZESTRIL) 20 MG tablet Take 20 mg by mouth daily.     . metFORMIN (GLUCOPHAGE) 1000 MG tablet Take 1,000 mg by mouth daily with breakfast.     . Multiple Vitamins-Minerals (MEGA MULTI MEN PO) Take 1 tablet by mouth daily.     . ondansetron (ZOFRAN) 8 MG tablet Take 1 tablet (8 mg total) by mouth 2 (two) times daily as needed for refractory nausea / vomiting. 30 tablet 1  . prochlorperazine (COMPAZINE) 10 MG tablet Take 1 tablet (10 mg total) by mouth every 6 (six) hours as needed (Nausea or vomiting). 30 tablet 1  . sertraline (ZOLOFT) 100 MG tablet Take by mouth.    . simvastatin (ZOCOR) 40 MG tablet Take 40 mg by mouth every evening.    . tamsulosin (FLOMAX) 0.4 MG CAPS capsule Take 1 capsule (0.4 mg total) by mouth daily after supper. 30 capsule 6  . gabapentin (NEURONTIN) 100 MG capsule Take by mouth.     No current facility-administered medications for this visit.     PHYSICAL EXAMINATION: ECOG PERFORMANCE STATUS: 2 - Symptomatic, <50% confined to bed  BP (!) 154/89 (BP Location: Left Arm, Patient Position: Sitting)   Pulse 60   Temp 98.9 F (37.2 C) (Tympanic)   Resp 18   Wt 210 lb 4 oz (95.4 kg)   BMI 31.97 kg/m   Filed Weights   12/18/15 0901  Weight: 210 lb 4 oz (95.4 kg)    GENERAL: Well-nourished well-developed; Alert, no distress and comfortable.  He can sit on exam table by himself.  Obese.  He is Alone. EYES: no pallor or icterus OROPHARYNX: no thrush or ulceration; good dentition  NECK: supple, no masses felt LYMPH:  no palpable lymphadenopathy in the cervical, axillary or inguinal regions LUNGS: clear to auscultation and  No wheeze or crackles HEART/CVS: regular rate & rhythm and no murmurs; No lower  extremity edema ABDOMEN:abdomen soft, non-tender and normal bowel sounds Musculoskeletal:no cyanosis of digits and no clubbing  PSYCH: alert & oriented x 3 with fluent speech NEURO: no focal motor/sensory deficits SKIN:  no rashes or significant lesions  LABORATORY DATA:  I have reviewed the data as listed    Component Value Date/Time   NA 136 12/18/2015 0820   K 4.9 12/18/2015 0820   CL 102 12/18/2015 0820   CO2 25 12/18/2015 0820   GLUCOSE 208 (H) 12/18/2015 0820   BUN 23 (H) 12/18/2015 0820   CREATININE 0.94 12/18/2015  0820   CALCIUM 10.1 12/18/2015 0820   PROT 7.1 12/18/2015 0820   ALBUMIN 4.0 12/18/2015 0820   AST 33 12/18/2015 0820   ALT 29 12/18/2015 0820   ALKPHOS 82 12/18/2015 0820   BILITOT 0.5 12/18/2015 0820   GFRNONAA >60 12/18/2015 0820   GFRAA >60 12/18/2015 0820    No results found for: SPEP, UPEP  Lab Results  Component Value Date   WBC 8.8 12/18/2015   NEUTROABS 6.8 (H) 12/18/2015   HGB 14.2 12/18/2015   HCT 40.5 12/18/2015   MCV 93.4 12/18/2015   PLT 216 12/18/2015      Chemistry      Component Value Date/Time   NA 136 12/18/2015 0820   K 4.9 12/18/2015 0820   CL 102 12/18/2015 0820   CO2 25 12/18/2015 0820   BUN 23 (H) 12/18/2015 0820   CREATININE 0.94 12/18/2015 0820      Component Value Date/Time   CALCIUM 10.1 12/18/2015 0820   ALKPHOS 82 12/18/2015 0820   AST 33 12/18/2015 0820   ALT 29 12/18/2015 0820   BILITOT 0.5 12/18/2015 0820       RADIOGRAPHIC STUDIES:   ASSESSMENT & PLAN:   Malignant neoplasm of prostate ([place]) # METASTATIC CASTRATE RESISTANT PROSTATE CANCER-Rising July 2017-43 from 24 a month ago. Chest abdomen pelvis- Improving retroperitoneal adenopathy; however bone scan shows questionable new right femoral /right iliac lesions.   # Patient has progressive castrate resistant prostate cancer. Recommend starting Taxotere every 3 weeks discussed the potential side effects. Start Neulasta.   # chronic diarrhea-  [> 8-10 months]; awaiting stool studies today.   #   Growth factor-Neulasta/On pro would be given as prophylaxis for chemotherapy-induced neutropenia to prevent febrile neutropenias. Discussed potential side effect- myalgias/arthralgias- recommend Claritin for 4 days.   # follow up in 10 days/cbc/bmp/possible IVFs.   # follow up with md/Labs/ chemo in 3 weeks.       Cammie Sickle, MD 12/18/2015 8:55 PM

## 2015-12-18 NOTE — Assessment & Plan Note (Addendum)
#   METASTATIC CASTRATE RESISTANT PROSTATE CANCER-Rising July 2017-43 from 24 a month ago. Chest abdomen pelvis- Improving retroperitoneal adenopathy; however bone scan shows questionable new right femoral /right iliac lesions.   # Patient has progressive castrate resistant prostate cancer. Recommend starting Taxotere every 3 weeks discussed the potential side effects. Start Neulasta.   # chronic diarrhea- [> 8-10 months]; awaiting stool studies today.   #   Growth factor-Neulasta/On pro would be given as prophylaxis for chemotherapy-induced neutropenia to prevent febrile neutropenias. Discussed potential side effect- myalgias/arthralgias- recommend Claritin for 4 days.   # follow up in 10 days/cbc/bmp/possible IVFs.   # follow up with md/Labs/ chemo in 3 weeks.

## 2015-12-19 ENCOUNTER — Other Ambulatory Visit: Payer: 59

## 2015-12-19 ENCOUNTER — Ambulatory Visit: Payer: 59 | Admitting: Internal Medicine

## 2015-12-29 ENCOUNTER — Inpatient Hospital Stay: Payer: 59

## 2016-01-08 ENCOUNTER — Inpatient Hospital Stay: Payer: 59

## 2016-01-08 ENCOUNTER — Inpatient Hospital Stay (HOSPITAL_BASED_OUTPATIENT_CLINIC_OR_DEPARTMENT_OTHER): Payer: 59 | Admitting: Internal Medicine

## 2016-01-08 VITALS — BP 157/82 | HR 56 | Temp 97.1°F | Resp 18 | Ht 68.0 in | Wt 207.8 lb

## 2016-01-08 VITALS — BP 137/77 | HR 66 | Temp 98.9°F | Resp 18

## 2016-01-08 DIAGNOSIS — E785 Hyperlipidemia, unspecified: Secondary | ICD-10-CM

## 2016-01-08 DIAGNOSIS — I252 Old myocardial infarction: Secondary | ICD-10-CM

## 2016-01-08 DIAGNOSIS — Z7689 Persons encountering health services in other specified circumstances: Secondary | ICD-10-CM

## 2016-01-08 DIAGNOSIS — C771 Secondary and unspecified malignant neoplasm of intrathoracic lymph nodes: Secondary | ICD-10-CM | POA: Diagnosis not present

## 2016-01-08 DIAGNOSIS — C61 Malignant neoplasm of prostate: Secondary | ICD-10-CM

## 2016-01-08 DIAGNOSIS — N183 Chronic kidney disease, stage 3 (moderate): Secondary | ICD-10-CM

## 2016-01-08 DIAGNOSIS — Z7984 Long term (current) use of oral hypoglycemic drugs: Secondary | ICD-10-CM

## 2016-01-08 DIAGNOSIS — F419 Anxiety disorder, unspecified: Secondary | ICD-10-CM

## 2016-01-08 DIAGNOSIS — R197 Diarrhea, unspecified: Secondary | ICD-10-CM

## 2016-01-08 DIAGNOSIS — E119 Type 2 diabetes mellitus without complications: Secondary | ICD-10-CM

## 2016-01-08 DIAGNOSIS — E669 Obesity, unspecified: Secondary | ICD-10-CM

## 2016-01-08 DIAGNOSIS — F17210 Nicotine dependence, cigarettes, uncomplicated: Secondary | ICD-10-CM

## 2016-01-08 DIAGNOSIS — C7951 Secondary malignant neoplasm of bone: Secondary | ICD-10-CM | POA: Diagnosis not present

## 2016-01-08 DIAGNOSIS — R5383 Other fatigue: Secondary | ICD-10-CM

## 2016-01-08 DIAGNOSIS — I129 Hypertensive chronic kidney disease with stage 1 through stage 4 chronic kidney disease, or unspecified chronic kidney disease: Secondary | ICD-10-CM

## 2016-01-08 DIAGNOSIS — Z79899 Other long term (current) drug therapy: Secondary | ICD-10-CM

## 2016-01-08 LAB — COMPREHENSIVE METABOLIC PANEL
ALBUMIN: 3.9 g/dL (ref 3.5–5.0)
ALT: 26 U/L (ref 17–63)
AST: 21 U/L (ref 15–41)
Alkaline Phosphatase: 69 U/L (ref 38–126)
Anion gap: 10 (ref 5–15)
BUN: 24 mg/dL — AB (ref 6–20)
CHLORIDE: 99 mmol/L — AB (ref 101–111)
CO2: 19 mmol/L — AB (ref 22–32)
CREATININE: 1.03 mg/dL (ref 0.61–1.24)
Calcium: 9.6 mg/dL (ref 8.9–10.3)
GFR calc non Af Amer: >60 mL/min (ref >60)
Glucose, Bld: 261 mg/dL — ABNORMAL HIGH (ref 65–99)
Potassium: 4.8 mmol/L (ref 3.5–5.1)
SODIUM: 128 mmol/L — AB (ref 135–145)
Total Bilirubin: 0.5 mg/dL (ref 0.3–1.2)
Total Protein: 6.9 g/dL (ref 6.5–8.1)

## 2016-01-08 LAB — CBC WITH DIFFERENTIAL/PLATELET
BASOS ABS: 0 10*3/uL (ref 0–0.1)
BASOS PCT: 1 %
EOS ABS: 0 10*3/uL (ref 0–0.7)
EOS PCT: 0 %
HCT: 36.3 % — ABNORMAL LOW (ref 40.0–52.0)
Hemoglobin: 12.8 g/dL — ABNORMAL LOW (ref 13.0–18.0)
Lymphocytes Relative: 4 %
Lymphs Abs: 0.3 10*3/uL — ABNORMAL LOW (ref 1.0–3.6)
MCH: 32.5 pg (ref 26.0–34.0)
MCHC: 35.2 g/dL (ref 32.0–36.0)
MCV: 92.4 fL (ref 80.0–100.0)
Monocytes Absolute: 0.3 10*3/uL (ref 0.2–1.0)
Monocytes Relative: 4 %
Neutro Abs: 6.2 10*3/uL (ref 1.4–6.5)
Neutrophils Relative %: 91 %
PLATELETS: 295 10*3/uL (ref 150–440)
RBC: 3.93 MIL/uL — AB (ref 4.40–5.90)
RDW: 14.4 % (ref 11.5–14.5)
WBC: 6.7 10*3/uL (ref 3.8–10.6)

## 2016-01-08 MED ORDER — DIPHENOXYLATE-ATROPINE 2.5-0.025 MG PO TABS: 1.0000 | ORAL_TABLET | Freq: Three times a day (TID) | ORAL | 0 refills | Status: DC | PRN

## 2016-01-08 MED ORDER — PEGFILGRASTIM 6 MG/0.6ML ~~LOC~~ PSKT
6.0000 mg | PREFILLED_SYRINGE | Freq: Once | SUBCUTANEOUS | Status: AC
  Administered 2016-01-08: 6 mg via SUBCUTANEOUS
  Filled 2016-01-08: qty 0.6

## 2016-01-08 MED ORDER — DOCETAXEL CHEMO INJECTION 160 MG/16ML
60.0000 mg/m2 | Freq: Once | INTRAVENOUS | Status: AC
  Administered 2016-01-08: 130 mg via INTRAVENOUS
  Filled 2016-01-08: qty 13

## 2016-01-08 MED ORDER — SODIUM CHLORIDE 0.9 % IV SOLN
10.0000 mg | Freq: Once | INTRAVENOUS | Status: AC
  Administered 2016-01-08: 10 mg via INTRAVENOUS
  Filled 2016-01-08: qty 1

## 2016-01-08 MED ORDER — SODIUM CHLORIDE 0.9% FLUSH
10.0000 mL | INTRAVENOUS | Status: DC | PRN
  Filled 2016-01-08: qty 10

## 2016-01-08 MED ORDER — SODIUM CHLORIDE 0.9 % IV SOLN
Freq: Once | INTRAVENOUS | Status: AC
  Administered 2016-01-08: 09:00:00 via INTRAVENOUS
  Filled 2016-01-08: qty 1000

## 2016-01-08 NOTE — Progress Notes (Signed)
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 Denies any significant nausea vomiting. Continues to f "significant diarrhea"  For "many years". This has not gotten significantly worse.  3 pounds weight loss. Denies any blood in stools. Denies any worsening pain. Continues to have chronic fatigue.  No new swelling in the legs. No shortness of breath.  REVIEW OF SYSTEMS:  A complete 10 point review of system is done which is negative except mentioned above/history of present illness.   PAST MEDICAL HISTORY :  Past Medical History:  Diagnosis Date  . Anxiety   . Cancer [facility])    states he had cancer in his left forearm but is unaware of type and was told his body took care of it  . Chronic kidney  disease    stage 3  . Depression   . Essential hypertension, benign   . Family history of adverse reaction to anesthesia    adopted  . Myocardial infarction ([place])   . Obesity   . Other and unspecified hyperlipidemia   . PONV (postoperative nausea and vomiting)    nausea  . Prostate cancer ([place])   . Type II or unspecified type diabetes mellitus without mention of complication, uncontrolled   . Varicosities     PAST SURGICAL HISTORY :   Past Surgical History:  Procedure Laterality Date  . CARDIAC CATHETERIZATION    . COLONOSCOPY  2013  . COLONOSCOPY WITH PROPOFOL N/A 01/23/2015   Procedure: COLONOSCOPY WITH PROPOFOL;  Surgeon: Hulen Luster, MD;  Location: [facility] ENDOSCOPY;  Service: Gastroenterology;  Laterality: N/A;  . INTRAOPERATIVE ARTERIOGRAM     for vascular disease  . TONSILLECTOMY    . TOTAL HIP ARTHROPLASTY Right 05/01/2015   Procedure: TOTAL HIP ARTHROPLASTY ANTERIOR APPROACH;  Surgeon: Hessie Knows, MD;  Location: [facility] ORS;  Service: Orthopedics;  Laterality: Right;    FAMILY HISTORY :   Family History  Problem Relation Age of Onset  . Adopted: Yes    SOCIAL HISTORY:   Social History  Substance Use Topics  . Smoking status: Current Every Day Smoker    Packs/day: 0.50    Types: Cigarettes  . Smokeless tobacco: Never Used  . Alcohol use 0.0 oz/week     Comment: 28 12 ounce beers per week  ALLERGIES:  is allergic to codeine sulfate and prednisone.  MEDICATIONS:  Current Outpatient Prescriptions  Medication Sig Dispense Refill  . ALPRAZolam (XANAX) 0.5 MG tablet Take by mouth.    Marland Kitchen amLODipine (NORVASC) 10 MG tablet Take 10 mg by mouth daily.    Marland Kitchen atenolol (TENORMIN) 100 MG tablet Take 100 mg by mouth daily.    . Cholecalciferol (VITAMIN D) 2000 units tablet     . Cyanocobalamin (RA VITAMIN B-12 TR) 1000 MCG TBCR Take by mouth.    . dexamethasone (DECADRON) 4 MG tablet Take 4 mg by mouth 2 (two) times daily with a meal. Take 1 tablet (4 mg total) by mouth 2  (two) times daily. Start the day before Taxotere. Then daily after chemo for 2 days. - Oral    . glimepiride (AMARYL) 4 MG tablet Take by mouth.    . isosorbide mononitrate (IMDUR) 30 MG 24 hr tablet Take 30 mg by mouth daily.    Marland Kitchen lisinopril (PRINIVIL,ZESTRIL) 20 MG tablet Take 20 mg by mouth daily.     Marland Kitchen loratadine (CLARITIN) 10 MG tablet Take 10 mg by mouth daily.    . metFORMIN (GLUCOPHAGE) 1000 MG tablet Take 1,000 mg by mouth daily with breakfast.     . Multiple Vitamins-Minerals (MEGA MULTI MEN PO) Take 1 tablet by mouth daily.     . ondansetron (ZOFRAN) 8 MG tablet Take 1 tablet (8 mg total) by mouth 2 (two) times daily as needed for refractory nausea / vomiting. 30 tablet 1  . prochlorperazine (COMPAZINE) 10 MG tablet Take 1 tablet (10 mg total) by mouth every 6 (six) hours as needed (Nausea or vomiting). 30 tablet 1  . sertraline (ZOLOFT) 100 MG tablet Take by mouth.    . simvastatin (ZOCOR) 40 MG tablet Take 40 mg by mouth every evening.    . tamsulosin (FLOMAX) 0.4 MG CAPS capsule Take 1 capsule (0.4 mg total) by mouth daily after supper. 30 capsule 6  . diphenoxylate-atropine (LOMOTIL) 2.5-0.025 MG tablet Take 1 tablet by mouth every 8 (eight) hours as needed for diarrhea or loose stools. Take it along with immodium 40 tablet 0  . gabapentin (NEURONTIN) 100 MG capsule Take by mouth.     No current facility-administered medications for this visit.     PHYSICAL EXAMINATION: ECOG PERFORMANCE STATUS: 2 - Symptomatic, <50% confined to bed  BP (!) 157/82 (BP Location: Left Arm, Patient Position: Sitting)   Pulse (!) 56   Temp 97.1 F (36.2 C) (Tympanic)   Resp 18   Ht 5\' 8"  (1.727 m)   Wt 207 lb 12.8 oz (94.3 kg)   BMI 31.60 kg/m   Filed Weights   01/08/16 0834  Weight: 207 lb 12.8 oz (94.3 kg)    GENERAL: Well-nourished well-developed; Alert, no distress and comfortable.  He can sit on exam table by himself.  Obese.  He is Alone. EYES: no pallor or icterus OROPHARYNX: no  thrush or ulceration; good dentition  NECK: supple, no masses felt LYMPH:  no palpable lymphadenopathy in the cervical, axillary or inguinal regions LUNGS: clear to auscultation and  No wheeze or crackles HEART/CVS: regular rate & rhythm and no murmurs; No lower extremity edema ABDOMEN:abdomen soft, non-tender and normal bowel sounds Musculoskeletal:no cyanosis of digits and no clubbing  PSYCH: alert & oriented x 3 with fluent speech NEURO: no focal motor/sensory deficits SKIN:  no rashes or significant lesions  LABORATORY DATA:  I have reviewed the data as listed  Component Value Date/Time   NA 128 (L) 01/08/2016 0813   K 4.8 01/08/2016 0813   CL 99 (L) 01/08/2016 0813   CO2 19 (L) 01/08/2016 0813   GLUCOSE 261 (H) 01/08/2016 0813   BUN 24 (H) 01/08/2016 0813   CREATININE 1.03 01/08/2016 0813   CALCIUM 9.6 01/08/2016 0813   PROT 6.9 01/08/2016 0813   ALBUMIN 3.9 01/08/2016 0813   AST 21 01/08/2016 0813   ALT 26 01/08/2016 0813   ALKPHOS 69 01/08/2016 0813   BILITOT 0.5 01/08/2016 0813   GFRNONAA >60 01/08/2016 0813   GFRAA >60 01/08/2016 0813    No results found for: SPEP, UPEP  Lab Results  Component Value Date   WBC 6.7 01/08/2016   NEUTROABS 6.2 01/08/2016   HGB 12.8 (L) 01/08/2016   HCT 36.3 (L) 01/08/2016   MCV 92.4 01/08/2016   PLT 295 01/08/2016      Chemistry      Component Value Date/Time   NA 128 (L) 01/08/2016 0813   K 4.8 01/08/2016 0813   CL 99 (L) 01/08/2016 0813   CO2 19 (L) 01/08/2016 0813   BUN 24 (H) 01/08/2016 0813   CREATININE 1.03 01/08/2016 0813      Component Value Date/Time   CALCIUM 9.6 01/08/2016 0813   ALKPHOS 69 01/08/2016 0813   AST 21 01/08/2016 0813   ALT 26 01/08/2016 0813   BILITOT 0.5 01/08/2016 0813       RADIOGRAPHIC STUDIES:   ASSESSMENT & PLAN:   Malignant neoplasm of prostate ([place]) # METASTATIC CASTRATE RESISTANT PROSTATE CANCER-Rising July 2017-43 from 24 a month ago. Chest abdomen pelvis- Improving  retroperitoneal adenopathy; however bone scan shows questionable new right femoral /right iliac lesions. On Taxotere palliative chemotherapy s/p #1. Tolerated okay- except extreme fatigue/ diarrhea.   # Proceed with cycle # 2. Labs- acceptable for chemo  # Sodium 128/likely poor by mouth intake- recommend increased fluid intake. Electrolytes/salt  # chronic diarrhea- [> 8-10 months]; negative. Continue immodium; add lomotil.  # follow up in 10 days/cbc/bmp/possible IVFs.   #  follow up with md/Labs/ chemo in 3 weeks.       Cammie Sickle, MD 01/08/2016 9:05 AM

## 2016-01-08 NOTE — Progress Notes (Signed)
Pt reports times a day of diarrhea

## 2016-01-08 NOTE — Assessment & Plan Note (Addendum)
#   METASTATIC CASTRATE RESISTANT PROSTATE CANCER-Rising July 2017-43 from 24 a month ago. Chest abdomen pelvis- Improving retroperitoneal adenopathy; however bone scan shows questionable new right femoral /right iliac lesions. On Taxotere palliative chemotherapy s/p #1. Tolerated okay- except extreme fatigue/ diarrhea.   # Proceed with cycle # 2. Labs- acceptable for chemo  # Sodium 128/likely poor by mouth intake- recommend increased fluid intake. Electrolytes/salt  # chronic diarrhea- [> 8-10 months]; negative. Continue immodium; add lomotil.  # follow up in 10 days/cbc/bmp/possible IVFs.   #  follow up with md/Labs/ chemo in 3 weeks.

## 2016-01-09 ENCOUNTER — Telehealth: Payer: Self-pay | Admitting: *Deleted

## 2016-01-09 ENCOUNTER — Inpatient Hospital Stay: Payer: 59

## 2016-01-09 DIAGNOSIS — C61 Malignant neoplasm of prostate: Secondary | ICD-10-CM

## 2016-01-09 DIAGNOSIS — C771 Secondary and unspecified malignant neoplasm of intrathoracic lymph nodes: Principal | ICD-10-CM

## 2016-01-09 MED ORDER — PEGFILGRASTIM INJECTION 6 MG/0.6ML ~~LOC~~
PREFILLED_SYRINGE | SUBCUTANEOUS | Status: AC
  Filled 2016-01-09: qty 0.6

## 2016-01-09 MED ORDER — PEGFILGRASTIM INJECTION 6 MG/0.6ML ~~LOC~~
6.0000 mg | PREFILLED_SYRINGE | Freq: Once | SUBCUTANEOUS | Status: AC
  Administered 2016-01-09: 6 mg via SUBCUTANEOUS

## 2016-01-09 NOTE — Progress Notes (Signed)
Patient here to get Neulasta injection.  OnPro was put on yesterday but patient stated that it came off during the night before her received injection.

## 2016-01-09 NOTE — Telephone Encounter (Signed)
Called to report that his On Pro came off. I spoke with Infusion charge nurse who said to have him come on in now. He said it would take about an hour to get here and he will bring injector with him

## 2016-01-20 ENCOUNTER — Inpatient Hospital Stay: Payer: 59

## 2016-01-20 ENCOUNTER — Other Ambulatory Visit: Payer: Self-pay | Admitting: Internal Medicine

## 2016-01-20 ENCOUNTER — Inpatient Hospital Stay: Payer: 59 | Attending: Internal Medicine

## 2016-01-20 VITALS — BP 109/67 | HR 62 | Temp 96.7°F | Resp 18

## 2016-01-20 DIAGNOSIS — I129 Hypertensive chronic kidney disease with stage 1 through stage 4 chronic kidney disease, or unspecified chronic kidney disease: Secondary | ICD-10-CM | POA: Insufficient documentation

## 2016-01-20 DIAGNOSIS — Z9119 Patient's noncompliance with other medical treatment and regimen: Secondary | ICD-10-CM | POA: Insufficient documentation

## 2016-01-20 DIAGNOSIS — Z5111 Encounter for antineoplastic chemotherapy: Secondary | ICD-10-CM | POA: Diagnosis not present

## 2016-01-20 DIAGNOSIS — N183 Chronic kidney disease, stage 3 (moderate): Secondary | ICD-10-CM | POA: Diagnosis not present

## 2016-01-20 DIAGNOSIS — Z7689 Persons encountering health services in other specified circumstances: Secondary | ICD-10-CM | POA: Insufficient documentation

## 2016-01-20 DIAGNOSIS — R634 Abnormal weight loss: Secondary | ICD-10-CM | POA: Insufficient documentation

## 2016-01-20 DIAGNOSIS — R197 Diarrhea, unspecified: Secondary | ICD-10-CM | POA: Diagnosis not present

## 2016-01-20 DIAGNOSIS — C771 Secondary and unspecified malignant neoplasm of intrathoracic lymph nodes: Secondary | ICD-10-CM | POA: Diagnosis not present

## 2016-01-20 DIAGNOSIS — E785 Hyperlipidemia, unspecified: Secondary | ICD-10-CM | POA: Diagnosis not present

## 2016-01-20 DIAGNOSIS — Z79899 Other long term (current) drug therapy: Secondary | ICD-10-CM | POA: Diagnosis not present

## 2016-01-20 DIAGNOSIS — C61 Malignant neoplasm of prostate: Secondary | ICD-10-CM | POA: Insufficient documentation

## 2016-01-20 DIAGNOSIS — E669 Obesity, unspecified: Secondary | ICD-10-CM | POA: Diagnosis not present

## 2016-01-20 DIAGNOSIS — C7951 Secondary malignant neoplasm of bone: Secondary | ICD-10-CM | POA: Insufficient documentation

## 2016-01-20 DIAGNOSIS — F419 Anxiety disorder, unspecified: Secondary | ICD-10-CM | POA: Diagnosis not present

## 2016-01-20 DIAGNOSIS — F17210 Nicotine dependence, cigarettes, uncomplicated: Secondary | ICD-10-CM | POA: Insufficient documentation

## 2016-01-20 DIAGNOSIS — E860 Dehydration: Secondary | ICD-10-CM

## 2016-01-20 DIAGNOSIS — E1165 Type 2 diabetes mellitus with hyperglycemia: Secondary | ICD-10-CM | POA: Insufficient documentation

## 2016-01-20 DIAGNOSIS — I252 Old myocardial infarction: Secondary | ICD-10-CM | POA: Diagnosis not present

## 2016-01-20 DIAGNOSIS — Z7984 Long term (current) use of oral hypoglycemic drugs: Secondary | ICD-10-CM | POA: Insufficient documentation

## 2016-01-20 LAB — CBC WITH DIFFERENTIAL/PLATELET
BASOS ABS: 0.1 10*3/uL (ref 0–0.1)
Basophils Relative: 0 %
EOS ABS: 0.1 10*3/uL (ref 0–0.7)
EOS PCT: 1 %
HCT: 34.4 % — ABNORMAL LOW (ref 40.0–52.0)
Hemoglobin: 11.8 g/dL — ABNORMAL LOW (ref 13.0–18.0)
LYMPHS PCT: 6 %
Lymphs Abs: 0.8 10*3/uL — ABNORMAL LOW (ref 1.0–3.6)
MCH: 32.1 pg (ref 26.0–34.0)
MCHC: 34.3 g/dL (ref 32.0–36.0)
MCV: 93.6 fL (ref 80.0–100.0)
Monocytes Absolute: 0.6 10*3/uL (ref 0.2–1.0)
Monocytes Relative: 5 %
Neutro Abs: 12.2 10*3/uL — ABNORMAL HIGH (ref 1.4–6.5)
Neutrophils Relative %: 88 %
PLATELETS: 181 10*3/uL (ref 150–440)
RBC: 3.68 MIL/uL — AB (ref 4.40–5.90)
RDW: 15.9 % — ABNORMAL HIGH (ref 11.5–14.5)
WBC: 13.8 10*3/uL — AB (ref 3.8–10.6)

## 2016-01-20 LAB — BASIC METABOLIC PANEL
ANION GAP: 7 (ref 5–15)
BUN: 21 mg/dL — ABNORMAL HIGH (ref 6–20)
CO2: 20 mmol/L — ABNORMAL LOW (ref 22–32)
Calcium: 8.6 mg/dL — ABNORMAL LOW (ref 8.9–10.3)
Chloride: 108 mmol/L (ref 101–111)
Creatinine, Ser: 1.04 mg/dL (ref 0.61–1.24)
Glucose, Bld: 208 mg/dL — ABNORMAL HIGH (ref 65–99)
POTASSIUM: 4.9 mmol/L (ref 3.5–5.1)
SODIUM: 135 mmol/L (ref 135–145)

## 2016-01-20 MED ORDER — SODIUM CHLORIDE 0.9 % IV SOLN
Freq: Once | INTRAVENOUS | Status: AC
  Administered 2016-01-20: 15:00:00 via INTRAVENOUS
  Filled 2016-01-20: qty 4

## 2016-01-20 MED ORDER — SODIUM CHLORIDE 0.9 % IV SOLN
Freq: Once | INTRAVENOUS | Status: AC
Start: 2016-01-20 — End: 2016-01-20
  Administered 2016-01-20: 15:00:00 via INTRAVENOUS
  Filled 2016-01-20: qty 1000

## 2016-01-29 ENCOUNTER — Inpatient Hospital Stay: Payer: 59

## 2016-01-29 ENCOUNTER — Inpatient Hospital Stay (HOSPITAL_BASED_OUTPATIENT_CLINIC_OR_DEPARTMENT_OTHER): Payer: 59 | Admitting: Internal Medicine

## 2016-01-29 ENCOUNTER — Other Ambulatory Visit: Payer: Self-pay

## 2016-01-29 ENCOUNTER — Encounter: Payer: Self-pay | Admitting: Internal Medicine

## 2016-01-29 VITALS — BP 149/83 | HR 71 | Temp 97.5°F | Resp 19 | Ht 68.0 in | Wt 220.8 lb

## 2016-01-29 DIAGNOSIS — Z9119 Patient's noncompliance with other medical treatment and regimen: Secondary | ICD-10-CM

## 2016-01-29 DIAGNOSIS — C61 Malignant neoplasm of prostate: Secondary | ICD-10-CM

## 2016-01-29 DIAGNOSIS — I252 Old myocardial infarction: Secondary | ICD-10-CM

## 2016-01-29 DIAGNOSIS — I129 Hypertensive chronic kidney disease with stage 1 through stage 4 chronic kidney disease, or unspecified chronic kidney disease: Secondary | ICD-10-CM

## 2016-01-29 DIAGNOSIS — F17210 Nicotine dependence, cigarettes, uncomplicated: Secondary | ICD-10-CM

## 2016-01-29 DIAGNOSIS — Z7984 Long term (current) use of oral hypoglycemic drugs: Secondary | ICD-10-CM

## 2016-01-29 DIAGNOSIS — E669 Obesity, unspecified: Secondary | ICD-10-CM

## 2016-01-29 DIAGNOSIS — C771 Secondary and unspecified malignant neoplasm of intrathoracic lymph nodes: Secondary | ICD-10-CM

## 2016-01-29 DIAGNOSIS — Z79899 Other long term (current) drug therapy: Secondary | ICD-10-CM

## 2016-01-29 DIAGNOSIS — C7951 Secondary malignant neoplasm of bone: Secondary | ICD-10-CM | POA: Diagnosis not present

## 2016-01-29 DIAGNOSIS — E1165 Type 2 diabetes mellitus with hyperglycemia: Secondary | ICD-10-CM

## 2016-01-29 DIAGNOSIS — N183 Chronic kidney disease, stage 3 (moderate): Secondary | ICD-10-CM

## 2016-01-29 DIAGNOSIS — Z7689 Persons encountering health services in other specified circumstances: Secondary | ICD-10-CM

## 2016-01-29 DIAGNOSIS — R197 Diarrhea, unspecified: Secondary | ICD-10-CM

## 2016-01-29 DIAGNOSIS — F419 Anxiety disorder, unspecified: Secondary | ICD-10-CM

## 2016-01-29 DIAGNOSIS — R634 Abnormal weight loss: Secondary | ICD-10-CM

## 2016-01-29 DIAGNOSIS — E785 Hyperlipidemia, unspecified: Secondary | ICD-10-CM

## 2016-01-29 LAB — CBC WITH DIFFERENTIAL/PLATELET
BASOS PCT: 0 %
Basophils Absolute: 0 10*3/uL (ref 0–0.1)
EOS ABS: 0 10*3/uL (ref 0–0.7)
EOS PCT: 0 %
HCT: 34.1 % — ABNORMAL LOW (ref 40.0–52.0)
HEMOGLOBIN: 11.7 g/dL — AB (ref 13.0–18.0)
LYMPHS ABS: 0.4 10*3/uL — AB (ref 1.0–3.6)
Lymphocytes Relative: 4 %
MCH: 32.5 pg (ref 26.0–34.0)
MCHC: 34.4 g/dL (ref 32.0–36.0)
MCV: 94.3 fL (ref 80.0–100.0)
Monocytes Absolute: 0.4 10*3/uL (ref 0.2–1.0)
Monocytes Relative: 4 %
NEUTROS PCT: 92 %
Neutro Abs: 8.2 10*3/uL — ABNORMAL HIGH (ref 1.4–6.5)
PLATELETS: 254 10*3/uL (ref 150–440)
RBC: 3.61 MIL/uL — AB (ref 4.40–5.90)
RDW: 16.5 % — ABNORMAL HIGH (ref 11.5–14.5)
WBC: 9.1 10*3/uL (ref 3.8–10.6)

## 2016-01-29 LAB — COMPREHENSIVE METABOLIC PANEL
ALBUMIN: 3.7 g/dL (ref 3.5–5.0)
ALT: 21 U/L (ref 17–63)
ANION GAP: 10 (ref 5–15)
AST: 23 U/L (ref 15–41)
Alkaline Phosphatase: 67 U/L (ref 38–126)
BUN: 28 mg/dL — ABNORMAL HIGH (ref 6–20)
CHLORIDE: 98 mmol/L — AB (ref 101–111)
CO2: 21 mmol/L — AB (ref 22–32)
Calcium: 8.9 mg/dL (ref 8.9–10.3)
Creatinine, Ser: 1.38 mg/dL — ABNORMAL HIGH (ref 0.61–1.24)
GFR calc non Af Amer: 54 mL/min — ABNORMAL LOW (ref >60)
GLUCOSE: 427 mg/dL — AB (ref 65–99)
Potassium: 5.3 mmol/L — ABNORMAL HIGH (ref 3.5–5.1)
SODIUM: 129 mmol/L — AB (ref 135–145)
Total Bilirubin: 0.5 mg/dL (ref 0.3–1.2)
Total Protein: 6.4 g/dL — ABNORMAL LOW (ref 6.5–8.1)

## 2016-01-29 MED ORDER — PEGFILGRASTIM 6 MG/0.6ML ~~LOC~~ PSKT
6.0000 mg | PREFILLED_SYRINGE | Freq: Once | SUBCUTANEOUS | Status: AC
  Administered 2016-01-29: 6 mg via SUBCUTANEOUS
  Filled 2016-01-29: qty 0.6

## 2016-01-29 MED ORDER — DEXTROSE 5 % IV SOLN
60.0000 mg/m2 | Freq: Once | INTRAVENOUS | Status: AC
  Administered 2016-01-29: 130 mg via INTRAVENOUS
  Filled 2016-01-29: qty 13

## 2016-01-29 MED ORDER — SODIUM CHLORIDE 0.9 % IV SOLN
10.0000 mg | Freq: Once | INTRAVENOUS | Status: AC
  Administered 2016-01-29: 10 mg via INTRAVENOUS
  Filled 2016-01-29: qty 1

## 2016-01-29 MED ORDER — GLIMEPIRIDE 4 MG PO TABS: 4.0000 mg | ORAL_TABLET | Freq: Every day | ORAL | 3 refills | Status: DC

## 2016-01-29 MED ORDER — SODIUM CHLORIDE 0.9 % IV SOLN
Freq: Once | INTRAVENOUS | Status: AC
  Administered 2016-01-29: 11:00:00 via INTRAVENOUS
  Filled 2016-01-29: qty 1000

## 2016-01-29 NOTE — Progress Notes (Signed)
[place] OFFICE PROGRESS NOTE  Patient Care Team: Tracie Harrier, MD as PCP - General (Internal Medicine)   SUMMARY OF ONCOLOGIC HISTORY:  Oncology History   # 2014- PROSTATE CA [s/p Bx-Gleason score 5+4; s/p EBRT [Dr.Crystal];July 2015- Biochem Relapse [PSA-15]; non-compliance with Lupron; JAN 2016-PSA 50   # MAY 2016- PSA-100/Bone scan- Bone mets [s/p EBRT Right Hip]; CT A/P- PELVIC/RP LN;   # July 2016- ? 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  Continues to have intermittent diarrhea which is not any significant do worse. Mild weight loss. No significant worsening pain. Chronic fatigue. Denies any tingling and numbness in his legs.  No new swelling in the legs. No shortness of breath.  REVIEW OF SYSTEMS:  A complete 10 point review of system is done which is negative except mentioned above/history of present illness.   PAST MEDICAL HISTORY :  Past Medical History:  Diagnosis Date  . Anxiety   . Cancer [facility])    states he had cancer in his left forearm but is unaware of type and was told his body took care of it  . Chronic kidney disease    stage 3  . Depression   . Essential hypertension, benign   . Family history of adverse reaction to anesthesia     adopted  . Myocardial infarction ([place])   . Obesity   . Other and unspecified hyperlipidemia   . PONV (postoperative nausea and vomiting)    nausea  . Prostate cancer ([place])   . Type II or unspecified type diabetes mellitus without mention of complication, uncontrolled   . Varicosities     PAST SURGICAL HISTORY :   Past Surgical History:  Procedure Laterality Date  . CARDIAC CATHETERIZATION    . COLONOSCOPY  2013  . COLONOSCOPY WITH PROPOFOL N/A 01/23/2015   Procedure: COLONOSCOPY WITH PROPOFOL;  Surgeon: Hulen Luster, MD;  Location: [facility] ENDOSCOPY;  Service: Gastroenterology;  Laterality: N/A;  . INTRAOPERATIVE ARTERIOGRAM     for vascular disease  . TONSILLECTOMY    . TOTAL HIP ARTHROPLASTY Right 05/01/2015   Procedure: TOTAL HIP ARTHROPLASTY ANTERIOR APPROACH;  Surgeon: Hessie Knows, MD;  Location: [facility] ORS;  Service: Orthopedics;  Laterality: Right;    FAMILY HISTORY :   Family History  Problem Relation Age of Onset  . Adopted: Yes    SOCIAL HISTORY:   Social History  Substance Use Topics  . Smoking status: Current Every Day Smoker    Packs/day: 0.50    Types: Cigarettes  . Smokeless tobacco: Never Used  . Alcohol use 0.0 oz/week     Comment: 28 12 ounce beers per week    ALLERGIES:  is allergic to codeine sulfate and prednisone.  MEDICATIONS:  Current Outpatient Prescriptions  Medication  Sig Dispense Refill  . ALPRAZolam (XANAX) 0.5 MG tablet Take by mouth.    Marland Kitchen amLODipine (NORVASC) 10 MG tablet Take 10 mg by mouth daily.    Marland Kitchen atenolol (TENORMIN) 100 MG tablet Take 100 mg by mouth daily.    . Cholecalciferol (VITAMIN D) 2000 units tablet     . Cyanocobalamin (RA VITAMIN B-12 TR) 1000 MCG TBCR Take by mouth.    . dexamethasone (DECADRON) 4 MG tablet Take 4 mg by mouth 2 (two) times daily with a meal. Take 1 tablet (4 mg total) by mouth 2 (two) times daily. Start the day before Taxotere. Then daily after chemo for 2 days. - Oral    . diphenoxylate-atropine  (LOMOTIL) 2.5-0.025 MG tablet Take 1 tablet by mouth every 8 (eight) hours as needed for diarrhea or loose stools. Take it along with immodium 40 tablet 0  . glimepiride (AMARYL) 4 MG tablet Take 1 tablet (4 mg total) by mouth daily with breakfast. 30 tablet 3  . isosorbide mononitrate (IMDUR) 30 MG 24 hr tablet Take 30 mg by mouth daily.    Marland Kitchen lisinopril (PRINIVIL,ZESTRIL) 20 MG tablet Take 20 mg by mouth daily.     Marland Kitchen loratadine (CLARITIN) 10 MG tablet Take 10 mg by mouth daily.    . metFORMIN (GLUCOPHAGE) 1000 MG tablet Take 1,000 mg by mouth daily with breakfast.     . Multiple Vitamins-Minerals (MEGA MULTI MEN PO) Take 1 tablet by mouth daily.     . ondansetron (ZOFRAN) 8 MG tablet Take 1 tablet (8 mg total) by mouth 2 (two) times daily as needed for refractory nausea / vomiting. 30 tablet 1  . prochlorperazine (COMPAZINE) 10 MG tablet Take 1 tablet (10 mg total) by mouth every 6 (six) hours as needed (Nausea or vomiting). 30 tablet 1  . sertraline (ZOLOFT) 100 MG tablet Take by mouth.    . simvastatin (ZOCOR) 40 MG tablet Take 40 mg by mouth every evening.    . tamsulosin (FLOMAX) 0.4 MG CAPS capsule Take 1 capsule (0.4 mg total) by mouth daily after supper. 30 capsule 6  . gabapentin (NEURONTIN) 100 MG capsule Take by mouth.     No current facility-administered medications for this visit.     PHYSICAL EXAMINATION: ECOG PERFORMANCE STATUS: 2 - Symptomatic, <50% confined to bed  BP (!) 149/83 (BP Location: Left Arm, Patient Position: Sitting)   Pulse 71   Temp 97.5 F (36.4 C) (Tympanic)   Resp 19   Ht 5\' 8"  (1.727 m)   Wt 220 lb 12.8 oz (100.2 kg)   BMI 33.57 kg/m   Filed Weights   01/29/16 0928  Weight: 220 lb 12.8 oz (100.2 kg)    GENERAL: Well-nourished well-developed; Alert, no distress and comfortable.  He can sit on exam table by himself.  Obese.  He is Alone. EYES: no pallor or icterus OROPHARYNX: no thrush or ulceration; good dentition  NECK: supple, no masses  felt LYMPH:  no palpable lymphadenopathy in the cervical, axillary or inguinal regions LUNGS: clear to auscultation and  No wheeze or crackles HEART/CVS: regular rate & rhythm and no murmurs; No lower extremity edema ABDOMEN:abdomen soft, non-tender and normal bowel sounds Musculoskeletal:no cyanosis of digits and no clubbing  PSYCH: alert & oriented x 3 with fluent speech NEURO: no focal motor/sensory deficits SKIN:  no rashes or significant lesions  LABORATORY DATA:  I have reviewed the data as listed    Component Value Date/Time   NA 129 (L) 01/29/2016  0913   K 5.3 (H) 01/29/2016 0913   CL 98 (L) 01/29/2016 0913   CO2 21 (L) 01/29/2016 0913   GLUCOSE 427 (H) 01/29/2016 0913   BUN 28 (H) 01/29/2016 0913   CREATININE 1.38 (H) 01/29/2016 0913   CALCIUM 8.9 01/29/2016 0913   PROT 6.4 (L) 01/29/2016 0913   ALBUMIN 3.7 01/29/2016 0913   AST 23 01/29/2016 0913   ALT 21 01/29/2016 0913   ALKPHOS 67 01/29/2016 0913   BILITOT 0.5 01/29/2016 0913   GFRNONAA 54 (L) 01/29/2016 0913   GFRAA >60 01/29/2016 0913    No results found for: SPEP, UPEP  Lab Results  Component Value Date   WBC 9.1 01/29/2016   NEUTROABS 8.2 (H) 01/29/2016   HGB 11.7 (L) 01/29/2016   HCT 34.1 (L) 01/29/2016   MCV 94.3 01/29/2016   PLT 254 01/29/2016      Chemistry      Component Value Date/Time   NA 129 (L) 01/29/2016 0913   K 5.3 (H) 01/29/2016 0913   CL 98 (L) 01/29/2016 0913   CO2 21 (L) 01/29/2016 0913   BUN 28 (H) 01/29/2016 0913   CREATININE 1.38 (H) 01/29/2016 0913      Component Value Date/Time   CALCIUM 8.9 01/29/2016 0913   ALKPHOS 67 01/29/2016 0913   AST 23 01/29/2016 0913   ALT 21 01/29/2016 0913   BILITOT 0.5 01/29/2016 0913       RADIOGRAPHIC STUDIES:   ASSESSMENT & PLAN:   Malignant neoplasm of prostate ([place]) # METASTATIC CASTRATE RESISTANT PROSTATE CANCER- On Taxotere palliative chemotherapy s/p #2 Tolerated okay- except extreme fatigue/ diarrhea.   # proceed with  cycle # 3 today.labs okay except creatinine- 1.3  # Blood sugars Elevated- 400+ sec to steroids-  Continue 1000 mg /day; Amaryl new script given.  Recommend follow up PCP.   # chronic diarrhea- [> 8-10 months]; negative- improved. Continue immodium; add lomotil.  # IVFs/ labs weekly/ see MD in 3 weeks.  Bring pill bottles next time.      Cammie Sickle, MD 02/01/2016 11:50 AM

## 2016-01-29 NOTE — Progress Notes (Signed)
Pt reports multiple loose stools a day and has rectal bleeding that he believes is related to hemaroids.

## 2016-01-29 NOTE — Assessment & Plan Note (Addendum)
#   METASTATIC CASTRATE RESISTANT PROSTATE CANCER- On Taxotere palliative chemotherapy s/p #2 Tolerated okay- except extreme fatigue/ diarrhea.   # proceed with cycle # 3 today.labs okay except creatinine- 1.3  # Blood sugars Elevated- 400+ sec to steroids-  Continue 1000 mg /day; Amaryl new script given.  Recommend follow up PCP.   # chronic diarrhea- [> 8-10 months]; negative- improved. Continue immodium; add lomotil.  # IVFs/ labs weekly/ see MD in 3 weeks.  Bring pill bottles next time.

## 2016-02-05 ENCOUNTER — Inpatient Hospital Stay: Payer: 59

## 2016-02-12 ENCOUNTER — Inpatient Hospital Stay: Payer: 59

## 2016-02-12 VITALS — BP 131/80 | HR 60 | Temp 98.0°F | Resp 18

## 2016-02-12 DIAGNOSIS — C61 Malignant neoplasm of prostate: Secondary | ICD-10-CM

## 2016-02-12 DIAGNOSIS — C771 Secondary and unspecified malignant neoplasm of intrathoracic lymph nodes: Secondary | ICD-10-CM

## 2016-02-12 DIAGNOSIS — E860 Dehydration: Secondary | ICD-10-CM

## 2016-02-12 LAB — BASIC METABOLIC PANEL
Anion gap: 8 (ref 5–15)
BUN: 23 mg/dL — AB (ref 6–20)
CALCIUM: 9 mg/dL (ref 8.9–10.3)
CHLORIDE: 102 mmol/L (ref 101–111)
CO2: 21 mmol/L — ABNORMAL LOW (ref 22–32)
CREATININE: 1.28 mg/dL — AB (ref 0.61–1.24)
GFR calc Af Amer: >60 mL/min (ref >60)
GFR, EST NON AFRICAN AMERICAN: 59 mL/min — AB (ref >60)
Glucose, Bld: 246 mg/dL — ABNORMAL HIGH (ref 65–99)
Potassium: 4.9 mmol/L (ref 3.5–5.1)
SODIUM: 131 mmol/L — AB (ref 135–145)

## 2016-02-12 MED ORDER — SODIUM CHLORIDE 0.9 % IV SOLN
Freq: Once | INTRAVENOUS | Status: AC
  Administered 2016-02-12: 10:00:00 via INTRAVENOUS
  Filled 2016-02-12: qty 4

## 2016-02-12 MED ORDER — SODIUM CHLORIDE 0.9 % IV SOLN
Freq: Once | INTRAVENOUS | Status: AC
  Administered 2016-02-12: 10:00:00 via INTRAVENOUS
  Filled 2016-02-12: qty 1000

## 2016-02-19 ENCOUNTER — Inpatient Hospital Stay (HOSPITAL_BASED_OUTPATIENT_CLINIC_OR_DEPARTMENT_OTHER): Payer: 59 | Admitting: Internal Medicine

## 2016-02-19 ENCOUNTER — Inpatient Hospital Stay: Payer: 59 | Attending: Internal Medicine

## 2016-02-19 ENCOUNTER — Inpatient Hospital Stay: Payer: 59

## 2016-02-19 ENCOUNTER — Encounter: Payer: Self-pay | Admitting: Internal Medicine

## 2016-02-19 VITALS — BP 131/74 | HR 60

## 2016-02-19 VITALS — BP 123/76 | HR 60 | Temp 96.3°F | Resp 19 | Ht 68.0 in | Wt 219.0 lb

## 2016-02-19 DIAGNOSIS — E119 Type 2 diabetes mellitus without complications: Secondary | ICD-10-CM | POA: Insufficient documentation

## 2016-02-19 DIAGNOSIS — N183 Chronic kidney disease, stage 3 (moderate): Secondary | ICD-10-CM | POA: Diagnosis not present

## 2016-02-19 DIAGNOSIS — F329 Major depressive disorder, single episode, unspecified: Secondary | ICD-10-CM | POA: Diagnosis not present

## 2016-02-19 DIAGNOSIS — K529 Noninfective gastroenteritis and colitis, unspecified: Secondary | ICD-10-CM

## 2016-02-19 DIAGNOSIS — Z5111 Encounter for antineoplastic chemotherapy: Secondary | ICD-10-CM | POA: Diagnosis not present

## 2016-02-19 DIAGNOSIS — E871 Hypo-osmolality and hyponatremia: Secondary | ICD-10-CM | POA: Diagnosis not present

## 2016-02-19 DIAGNOSIS — R197 Diarrhea, unspecified: Secondary | ICD-10-CM | POA: Insufficient documentation

## 2016-02-19 DIAGNOSIS — R5383 Other fatigue: Secondary | ICD-10-CM | POA: Diagnosis not present

## 2016-02-19 DIAGNOSIS — F17210 Nicotine dependence, cigarettes, uncomplicated: Secondary | ICD-10-CM | POA: Diagnosis not present

## 2016-02-19 DIAGNOSIS — R630 Anorexia: Secondary | ICD-10-CM | POA: Insufficient documentation

## 2016-02-19 DIAGNOSIS — R112 Nausea with vomiting, unspecified: Secondary | ICD-10-CM

## 2016-02-19 DIAGNOSIS — Z7984 Long term (current) use of oral hypoglycemic drugs: Secondary | ICD-10-CM | POA: Diagnosis not present

## 2016-02-19 DIAGNOSIS — Z79899 Other long term (current) drug therapy: Secondary | ICD-10-CM | POA: Diagnosis not present

## 2016-02-19 DIAGNOSIS — C7951 Secondary malignant neoplasm of bone: Secondary | ICD-10-CM | POA: Insufficient documentation

## 2016-02-19 DIAGNOSIS — I252 Old myocardial infarction: Secondary | ICD-10-CM

## 2016-02-19 DIAGNOSIS — E785 Hyperlipidemia, unspecified: Secondary | ICD-10-CM | POA: Diagnosis not present

## 2016-02-19 DIAGNOSIS — E669 Obesity, unspecified: Secondary | ICD-10-CM | POA: Diagnosis not present

## 2016-02-19 DIAGNOSIS — F419 Anxiety disorder, unspecified: Secondary | ICD-10-CM

## 2016-02-19 DIAGNOSIS — C61 Malignant neoplasm of prostate: Secondary | ICD-10-CM

## 2016-02-19 DIAGNOSIS — I129 Hypertensive chronic kidney disease with stage 1 through stage 4 chronic kidney disease, or unspecified chronic kidney disease: Secondary | ICD-10-CM | POA: Insufficient documentation

## 2016-02-19 DIAGNOSIS — C771 Secondary and unspecified malignant neoplasm of intrathoracic lymph nodes: Secondary | ICD-10-CM | POA: Insufficient documentation

## 2016-02-19 LAB — CBC WITH DIFFERENTIAL/PLATELET
BASOS PCT: 1 %
Basophils Absolute: 0.1 10*3/uL (ref 0–0.1)
EOS ABS: 0 10*3/uL (ref 0–0.7)
Eosinophils Relative: 0 %
HEMATOCRIT: 33.7 % — AB (ref 40.0–52.0)
Hemoglobin: 11.7 g/dL — ABNORMAL LOW (ref 13.0–18.0)
Lymphocytes Relative: 7 %
Lymphs Abs: 0.7 10*3/uL — ABNORMAL LOW (ref 1.0–3.6)
MCH: 32.1 pg (ref 26.0–34.0)
MCHC: 34.7 g/dL (ref 32.0–36.0)
MCV: 92.6 fL (ref 80.0–100.0)
MONO ABS: 0.8 10*3/uL (ref 0.2–1.0)
MONOS PCT: 7 %
NEUTROS ABS: 9.1 10*3/uL — AB (ref 1.4–6.5)
Neutrophils Relative %: 85 %
Platelets: 268 10*3/uL (ref 150–440)
RBC: 3.65 MIL/uL — ABNORMAL LOW (ref 4.40–5.90)
RDW: 15.9 % — AB (ref 11.5–14.5)
WBC: 10.7 10*3/uL — ABNORMAL HIGH (ref 3.8–10.6)

## 2016-02-19 LAB — COMPREHENSIVE METABOLIC PANEL
ALBUMIN: 3.7 g/dL (ref 3.5–5.0)
ALT: 19 U/L (ref 17–63)
ANION GAP: 10 (ref 5–15)
AST: 19 U/L (ref 15–41)
Alkaline Phosphatase: 66 U/L (ref 38–126)
BILIRUBIN TOTAL: 0.7 mg/dL (ref 0.3–1.2)
BUN: 24 mg/dL — ABNORMAL HIGH (ref 6–20)
CO2: 20 mmol/L — ABNORMAL LOW (ref 22–32)
Calcium: 8.8 mg/dL — ABNORMAL LOW (ref 8.9–10.3)
Chloride: 94 mmol/L — ABNORMAL LOW (ref 101–111)
Creatinine, Ser: 1.39 mg/dL — ABNORMAL HIGH (ref 0.61–1.24)
GFR calc Af Amer: >60 mL/min (ref >60)
GFR calc non Af Amer: 54 mL/min — ABNORMAL LOW (ref >60)
GLUCOSE: 210 mg/dL — AB (ref 65–99)
POTASSIUM: 4.6 mmol/L (ref 3.5–5.1)
Sodium: 124 mmol/L — ABNORMAL LOW (ref 135–145)
TOTAL PROTEIN: 6.5 g/dL (ref 6.5–8.1)

## 2016-02-19 MED ORDER — DOCETAXEL CHEMO INJECTION 160 MG/16ML
60.0000 mg/m2 | Freq: Once | INTRAVENOUS | Status: AC
  Administered 2016-02-19: 130 mg via INTRAVENOUS
  Filled 2016-02-19: qty 13

## 2016-02-19 MED ORDER — PEGFILGRASTIM 6 MG/0.6ML ~~LOC~~ PSKT
6.0000 mg | PREFILLED_SYRINGE | Freq: Once | SUBCUTANEOUS | Status: AC
  Administered 2016-02-19: 6 mg via SUBCUTANEOUS
  Filled 2016-02-19: qty 0.6

## 2016-02-19 MED ORDER — SODIUM CHLORIDE 0.9 % IV SOLN
10.0000 mg | Freq: Once | INTRAVENOUS | Status: AC
  Administered 2016-02-19: 10 mg via INTRAVENOUS
  Filled 2016-02-19: qty 1

## 2016-02-19 MED ORDER — SODIUM CHLORIDE 0.9 % IV SOLN
Freq: Once | INTRAVENOUS | Status: AC
  Administered 2016-02-19: 11:00:00 via INTRAVENOUS
  Filled 2016-02-19: qty 1000

## 2016-02-19 NOTE — Assessment & Plan Note (Addendum)
#   METASTATIC CASTRATE RESISTANT PROSTATE CANCER- On Taxotere palliative chemotherapy s/p #3 Tolerated okay- except extreme fatigue/ diarrhea.   # proceed with cycle # 4 today.labs okay except creatinine- 1.3  # Blood sugars Elevated- 210-  Continue 1000 mg /day; Amaryl  4mg  /day.  Apt with PCP in few days.   # Hyponatremia- sodium- 124; creat 1.3; stop lisinorpil.   # chronic diarrhea-[ "6"]; improved. Continue imodium/ lomotil improved .  #  2 weeks- IVFs/ labs/PSA;  MD in 3 weeks/chemo/labs. Reviewed the pill bottles.

## 2016-02-19 NOTE — Progress Notes (Signed)
Pt reports he has increasing fatigue and tiredness and this last treatment is still making him feel bad.  Pt's other major concern is the diarreah Pt reports having more nausea.

## 2016-02-19 NOTE — Progress Notes (Signed)
[place] OFFICE PROGRESS NOTE  Patient Care Team: Tracie Harrier, MD as PCP - General (Internal Medicine)   SUMMARY OF ONCOLOGIC HISTORY:  Oncology History   # 2014- PROSTATE CA [s/p Bx-Gleason score 5+4; s/p EBRT [Dr.Crystal];July 2015- Biochem Relapse [PSA-15]; non-compliance with Lupron; JAN 2016-PSA 50   # MAY 2016- PSA-100/Bone scan- Bone mets [s/p EBRT Right Hip]; CT A/P- PELVIC/RP LN;   # July 2016- ? 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  Complains of chronic fatigue. Admits to poor by mouth intake. Mild nausea with mild vomiting.  Continues to have intermittent diarrhea which is not any significant do worse.  Chronic fatigue. Denies any tingling and numbness in his legs.  No new swelling in the legs. No shortness of breath.  REVIEW OF SYSTEMS:  A complete 10 point review of system is done which is negative except mentioned above/history of present illness.   PAST MEDICAL HISTORY :  Past Medical History:  Diagnosis Date  . Anxiety   . Cancer [facility])    states he had cancer in his left forearm but is unaware of type and was told his body took care of it  . Chronic kidney disease    stage 3  . Depression   . Essential hypertension, benign   .  Family history of adverse reaction to anesthesia    adopted  . Myocardial infarction   . Obesity   . Other and unspecified hyperlipidemia   . PONV (postoperative nausea and vomiting)    nausea  . Prostate cancer ([place])   . Type II or unspecified type diabetes mellitus without mention of complication, uncontrolled   . Varicosities     PAST SURGICAL HISTORY :   Past Surgical History:  Procedure Laterality Date  . CARDIAC CATHETERIZATION    . COLONOSCOPY  2013  . COLONOSCOPY WITH PROPOFOL N/A 01/23/2015   Procedure: COLONOSCOPY WITH PROPOFOL;  Surgeon: Hulen Luster, MD;  Location: [facility] ENDOSCOPY;  Service: Gastroenterology;  Laterality: N/A;  . INTRAOPERATIVE ARTERIOGRAM     for vascular disease  . TONSILLECTOMY    . TOTAL HIP ARTHROPLASTY Right 05/01/2015   Procedure: TOTAL HIP ARTHROPLASTY ANTERIOR APPROACH;  Surgeon: Hessie Knows, MD;  Location: [facility] ORS;  Service: Orthopedics;  Laterality: Right;    FAMILY HISTORY :   Family History  Problem Relation Age of Onset  . Adopted: Yes    SOCIAL HISTORY:   Social History  Substance Use Topics  . Smoking status: Current Every Day Smoker    Packs/day: 0.50    Types: Cigarettes  . Smokeless tobacco: Never Used  . Alcohol use 0.0 oz/week     Comment: 28 12 ounce beers per week    ALLERGIES:  is allergic to codeine sulfate and  prednisone.  MEDICATIONS:  Current Outpatient Prescriptions  Medication Sig Dispense Refill  . ALPRAZolam (XANAX) 0.5 MG tablet Take by mouth.    Marland Kitchen amLODipine (NORVASC) 10 MG tablet Take 10 mg by mouth daily.    Marland Kitchen atenolol (TENORMIN) 100 MG tablet Take 100 mg by mouth daily.    . Cholecalciferol (VITAMIN D) 2000 units tablet     . Cyanocobalamin (RA VITAMIN B-12 TR) 1000 MCG TBCR Take by mouth.    . dexamethasone (DECADRON) 4 MG tablet Take 4 mg by mouth 2 (two) times daily with a meal. Take 1 tablet (4 mg total) by mouth 2 (two) times daily. Start the day before Taxotere. Then daily after chemo for 2 days.  - Oral    . diphenoxylate-atropine (LOMOTIL) 2.5-0.025 MG tablet Take 1 tablet by mouth every 8 (eight) hours as needed for diarrhea or loose stools. Take it along with immodium 40 tablet 0  . glimepiride (AMARYL) 4 MG tablet Take 1 tablet (4 mg total) by mouth daily with breakfast. 30 tablet 3  . isosorbide mononitrate (IMDUR) 30 MG 24 hr tablet Take 30 mg by mouth daily.    Marland Kitchen lisinopril (PRINIVIL,ZESTRIL) 20 MG tablet Take 20 mg by mouth daily.     Marland Kitchen loperamide (IMODIUM) 1 MG/5ML solution Take 1 mg by mouth as needed for diarrhea or loose stools.    Marland Kitchen loratadine (CLARITIN) 10 MG tablet Take 10 mg by mouth daily.    . metFORMIN (GLUCOPHAGE) 1000 MG tablet Take 1,000 mg by mouth daily with breakfast.     . Multiple Vitamins-Minerals (MEGA MULTI MEN PO) Take 1 tablet by mouth daily.     . ondansetron (ZOFRAN) 8 MG tablet Take 1 tablet (8 mg total) by mouth 2 (two) times daily as needed for refractory nausea / vomiting. 30 tablet 1  . simvastatin (ZOCOR) 40 MG tablet Take 40 mg by mouth every evening.    . tamsulosin (FLOMAX) 0.4 MG CAPS capsule Take 1 capsule (0.4 mg total) by mouth daily after supper. 30 capsule 6  . gabapentin (NEURONTIN) 100 MG capsule Take by mouth.    . prochlorperazine (COMPAZINE) 10 MG tablet Take 1 tablet (10 mg total) by mouth every 6 (six) hours as needed (Nausea or vomiting). (Patient not taking: Reported on 02/19/2016) 30 tablet 1   No current facility-administered medications for this visit.     PHYSICAL EXAMINATION: ECOG PERFORMANCE STATUS: 2 - Symptomatic, <50% confined to bed  BP 123/76 (BP Location: Left Arm, Patient Position: Sitting)   Pulse 60   Temp (!) 96.3 F (35.7 C) (Tympanic)   Resp 19   Ht 5\' 8"  (1.727 m)   Wt 219 lb (99.3 kg)   BMI 33.30 kg/m   Filed Weights   02/19/16 0936  Weight: 219 lb (99.3 kg)    GENERAL: Well-nourished well-developed; Alert, no distress and comfortable.  He can sit on exam table by himself.  Obese.  He is  Alone. EYES: no pallor or icterus OROPHARYNX: no thrush or ulceration; good dentition  NECK: supple, no masses felt LYMPH:  no palpable lymphadenopathy in the cervical, axillary or inguinal regions LUNGS: clear to auscultation and  No wheeze or crackles HEART/CVS: regular rate & rhythm and no murmurs; No lower extremity edema ABDOMEN:abdomen soft, non-tender and normal bowel sounds Musculoskeletal:no cyanosis of digits and no clubbing  PSYCH: alert & oriented x 3 with fluent speech NEURO: no focal motor/sensory deficits SKIN:  no rashes or significant lesions  LABORATORY DATA:  I have reviewed the data as listed    Component Value Date/Time   NA 124 (L) 02/19/2016 0905   K 4.6 02/19/2016 0905   CL 94 (L) 02/19/2016 0905   CO2 20 (L) 02/19/2016 0905   GLUCOSE 210 (H) 02/19/2016 0905   BUN 24 (H) 02/19/2016 0905   CREATININE 1.39 (H) 02/19/2016 0905   CALCIUM 8.8 (L) 02/19/2016 0905   PROT 6.5 02/19/2016 0905   ALBUMIN 3.7 02/19/2016 0905   AST 19 02/19/2016 0905   ALT 19 02/19/2016 0905   ALKPHOS 66 02/19/2016 0905   BILITOT 0.7 02/19/2016 0905   GFRNONAA 54 (L) 02/19/2016 0905   GFRAA >60 02/19/2016 0905    No results found for: SPEP, UPEP  Lab Results  Component Value Date   WBC 10.7 (H) 02/19/2016   NEUTROABS 9.1 (H) 02/19/2016   HGB 11.7 (L) 02/19/2016   HCT 33.7 (L) 02/19/2016   MCV 92.6 02/19/2016   PLT 268 02/19/2016      Chemistry      Component Value Date/Time   NA 124 (L) 02/19/2016 0905   K 4.6 02/19/2016 0905   CL 94 (L) 02/19/2016 0905   CO2 20 (L) 02/19/2016 0905   BUN 24 (H) 02/19/2016 0905   CREATININE 1.39 (H) 02/19/2016 0905      Component Value Date/Time   CALCIUM 8.8 (L) 02/19/2016 0905   ALKPHOS 66 02/19/2016 0905   AST 19 02/19/2016 0905   ALT 19 02/19/2016 0905   BILITOT 0.7 02/19/2016 0905       RADIOGRAPHIC STUDIES:   ASSESSMENT & PLAN:   Malignant neoplasm of prostate ([place]) # METASTATIC CASTRATE RESISTANT PROSTATE  CANCER- On Taxotere palliative chemotherapy s/p #3 Tolerated okay- except extreme fatigue/ diarrhea.   # proceed with cycle # 4 today.labs okay except creatinine- 1.3  # Blood sugars Elevated- 210-  Continue 1000 mg /day; Amaryl  4mg  /day.  Apt with PCP in few days.   # Hyponatremia- sodium- 124; creat 1.3; stop lisinorpil.   # chronic diarrhea-[ "6"]; improved. Continue imodium/ lomotil improved .  #  2 weeks- IVFs/ labs/PSA;  MD in 3 weeks/chemo/labs. Reviewed the pill bottles.      Cammie Sickle, MD 02/20/2016 7:27 AM

## 2016-03-04 ENCOUNTER — Inpatient Hospital Stay: Payer: 59

## 2016-03-04 VITALS — BP 126/77 | HR 62 | Temp 97.1°F | Resp 18

## 2016-03-04 DIAGNOSIS — C61 Malignant neoplasm of prostate: Secondary | ICD-10-CM

## 2016-03-04 DIAGNOSIS — E860 Dehydration: Secondary | ICD-10-CM

## 2016-03-04 LAB — BASIC METABOLIC PANEL
ANION GAP: 7 (ref 5–15)
BUN: 18 mg/dL (ref 6–20)
CALCIUM: 9.2 mg/dL (ref 8.9–10.3)
CO2: 21 mmol/L — ABNORMAL LOW (ref 22–32)
CREATININE: 1.07 mg/dL (ref 0.61–1.24)
Chloride: 102 mmol/L (ref 101–111)
GFR calc Af Amer: >60 mL/min (ref >60)
GLUCOSE: 178 mg/dL — AB (ref 65–99)
Potassium: 5 mmol/L (ref 3.5–5.1)
Sodium: 130 mmol/L — ABNORMAL LOW (ref 135–145)

## 2016-03-04 LAB — CBC WITH DIFFERENTIAL/PLATELET
BASOS ABS: 0.1 10*3/uL (ref 0–0.1)
BASOS PCT: 1 %
EOS ABS: 0.1 10*3/uL (ref 0–0.7)
EOS PCT: 1 %
HCT: 32.6 % — ABNORMAL LOW (ref 40.0–52.0)
Hemoglobin: 11.2 g/dL — ABNORMAL LOW (ref 13.0–18.0)
Lymphocytes Relative: 7 %
Lymphs Abs: 0.8 10*3/uL — ABNORMAL LOW (ref 1.0–3.6)
MCH: 32.2 pg (ref 26.0–34.0)
MCHC: 34.4 g/dL (ref 32.0–36.0)
MCV: 93.7 fL (ref 80.0–100.0)
MONO ABS: 0.7 10*3/uL (ref 0.2–1.0)
Monocytes Relative: 6 %
NEUTROS ABS: 9.8 10*3/uL — AB (ref 1.4–6.5)
Neutrophils Relative %: 85 %
PLATELETS: 230 10*3/uL (ref 150–440)
RBC: 3.47 MIL/uL — ABNORMAL LOW (ref 4.40–5.90)
RDW: 16 % — AB (ref 11.5–14.5)
WBC: 11.5 10*3/uL — ABNORMAL HIGH (ref 3.8–10.6)

## 2016-03-04 LAB — PSA: PSA: 68.03 ng/mL — AB (ref 0.00–4.00)

## 2016-03-04 MED ORDER — SODIUM CHLORIDE 0.9 % IV SOLN
Freq: Once | INTRAVENOUS | Status: AC
  Administered 2016-03-04: 10:00:00 via INTRAVENOUS
  Filled 2016-03-04: qty 1000

## 2016-03-11 ENCOUNTER — Inpatient Hospital Stay: Payer: 59

## 2016-03-11 ENCOUNTER — Inpatient Hospital Stay (HOSPITAL_BASED_OUTPATIENT_CLINIC_OR_DEPARTMENT_OTHER): Payer: 59 | Admitting: Internal Medicine

## 2016-03-11 VITALS — BP 135/75 | HR 66 | Temp 97.3°F | Resp 18 | Wt 220.0 lb

## 2016-03-11 DIAGNOSIS — R197 Diarrhea, unspecified: Secondary | ICD-10-CM | POA: Diagnosis not present

## 2016-03-11 DIAGNOSIS — C61 Malignant neoplasm of prostate: Secondary | ICD-10-CM

## 2016-03-11 DIAGNOSIS — I252 Old myocardial infarction: Secondary | ICD-10-CM

## 2016-03-11 DIAGNOSIS — Z7984 Long term (current) use of oral hypoglycemic drugs: Secondary | ICD-10-CM

## 2016-03-11 DIAGNOSIS — I129 Hypertensive chronic kidney disease with stage 1 through stage 4 chronic kidney disease, or unspecified chronic kidney disease: Secondary | ICD-10-CM

## 2016-03-11 DIAGNOSIS — F329 Major depressive disorder, single episode, unspecified: Secondary | ICD-10-CM

## 2016-03-11 DIAGNOSIS — E785 Hyperlipidemia, unspecified: Secondary | ICD-10-CM

## 2016-03-11 DIAGNOSIS — E871 Hypo-osmolality and hyponatremia: Secondary | ICD-10-CM

## 2016-03-11 DIAGNOSIS — E669 Obesity, unspecified: Secondary | ICD-10-CM

## 2016-03-11 DIAGNOSIS — F17210 Nicotine dependence, cigarettes, uncomplicated: Secondary | ICD-10-CM

## 2016-03-11 DIAGNOSIS — Z79899 Other long term (current) drug therapy: Secondary | ICD-10-CM

## 2016-03-11 DIAGNOSIS — K529 Noninfective gastroenteritis and colitis, unspecified: Secondary | ICD-10-CM

## 2016-03-11 DIAGNOSIS — R630 Anorexia: Secondary | ICD-10-CM

## 2016-03-11 DIAGNOSIS — E119 Type 2 diabetes mellitus without complications: Secondary | ICD-10-CM

## 2016-03-11 DIAGNOSIS — C7951 Secondary malignant neoplasm of bone: Secondary | ICD-10-CM | POA: Diagnosis not present

## 2016-03-11 DIAGNOSIS — R5383 Other fatigue: Secondary | ICD-10-CM

## 2016-03-11 DIAGNOSIS — R112 Nausea with vomiting, unspecified: Secondary | ICD-10-CM

## 2016-03-11 DIAGNOSIS — C771 Secondary and unspecified malignant neoplasm of intrathoracic lymph nodes: Secondary | ICD-10-CM

## 2016-03-11 DIAGNOSIS — N183 Chronic kidney disease, stage 3 (moderate): Secondary | ICD-10-CM

## 2016-03-11 DIAGNOSIS — F419 Anxiety disorder, unspecified: Secondary | ICD-10-CM

## 2016-03-11 LAB — COMPREHENSIVE METABOLIC PANEL
ALBUMIN: 3.6 g/dL (ref 3.5–5.0)
ALK PHOS: 63 U/L (ref 38–126)
ALT: 19 U/L (ref 17–63)
AST: 23 U/L (ref 15–41)
Anion gap: 11 (ref 5–15)
BILIRUBIN TOTAL: 0.4 mg/dL (ref 0.3–1.2)
BUN: 22 mg/dL — ABNORMAL HIGH (ref 6–20)
CALCIUM: 9.1 mg/dL (ref 8.9–10.3)
CO2: 22 mmol/L (ref 22–32)
CREATININE: 1.13 mg/dL (ref 0.61–1.24)
Chloride: 97 mmol/L — ABNORMAL LOW (ref 101–111)
GFR calc non Af Amer: >60 mL/min (ref >60)
GLUCOSE: 187 mg/dL — AB (ref 65–99)
Potassium: 5 mmol/L (ref 3.5–5.1)
Sodium: 130 mmol/L — ABNORMAL LOW (ref 135–145)
TOTAL PROTEIN: 6 g/dL — AB (ref 6.5–8.1)

## 2016-03-11 LAB — CBC WITH DIFFERENTIAL/PLATELET
BASOS ABS: 0.1 10*3/uL (ref 0–0.1)
BASOS PCT: 1 %
EOS PCT: 1 %
Eosinophils Absolute: 0 10*3/uL (ref 0–0.7)
HCT: 30.1 % — ABNORMAL LOW (ref 40.0–52.0)
Hemoglobin: 10.5 g/dL — ABNORMAL LOW (ref 13.0–18.0)
Lymphocytes Relative: 8 %
Lymphs Abs: 0.8 10*3/uL — ABNORMAL LOW (ref 1.0–3.6)
MCH: 33 pg (ref 26.0–34.0)
MCHC: 35 g/dL (ref 32.0–36.0)
MCV: 94.3 fL (ref 80.0–100.0)
MONO ABS: 1.1 10*3/uL — AB (ref 0.2–1.0)
MONOS PCT: 11 %
Neutro Abs: 7.8 10*3/uL — ABNORMAL HIGH (ref 1.4–6.5)
Neutrophils Relative %: 79 %
PLATELETS: 246 10*3/uL (ref 150–440)
RBC: 3.19 MIL/uL — ABNORMAL LOW (ref 4.40–5.90)
RDW: 16.8 % — AB (ref 11.5–14.5)
WBC: 9.7 10*3/uL (ref 3.8–10.6)

## 2016-03-11 MED ORDER — DOCETAXEL CHEMO INJECTION 160 MG/16ML: 60.0000 mg/m2 | Freq: Once | INTRAVENOUS | Status: DC

## 2016-03-11 MED ORDER — PEGFILGRASTIM 6 MG/0.6ML ~~LOC~~ PSKT
6.0000 mg | PREFILLED_SYRINGE | Freq: Once | SUBCUTANEOUS | Status: AC
  Administered 2016-03-11: 6 mg via SUBCUTANEOUS
  Filled 2016-03-11: qty 0.6

## 2016-03-11 MED ORDER — DEXAMETHASONE SODIUM PHOSPHATE 10 MG/ML IJ SOLN
10.0000 mg | Freq: Once | INTRAMUSCULAR | Status: AC
  Administered 2016-03-11: 10 mg via INTRAVENOUS
  Filled 2016-03-11: qty 1

## 2016-03-11 MED ORDER — SODIUM CHLORIDE 0.9 % IV SOLN
60.0000 mg/m2 | Freq: Once | INTRAVENOUS | Status: AC
  Administered 2016-03-11: 130 mg via INTRAVENOUS
  Filled 2016-03-11: qty 13

## 2016-03-11 MED ORDER — SODIUM CHLORIDE 0.9 % IV SOLN
Freq: Once | INTRAVENOUS | Status: AC
  Administered 2016-03-11: 10:00:00 via INTRAVENOUS
  Filled 2016-03-11: qty 1000

## 2016-03-11 MED ORDER — SODIUM CHLORIDE 0.9 % IV SOLN: 10.0000 mg | Freq: Once | INTRAVENOUS | Status: DC

## 2016-03-11 NOTE — Progress Notes (Signed)
Patient is here for follow up  

## 2016-03-11 NOTE — Assessment & Plan Note (Addendum)
#   METASTATIC CASTRATE RESISTANT PROSTATE CANCER- On Taxotere palliative chemotherapy s/p #4 Tolerated okay- except extreme fatigue/ diarrhea.   # proceed with cycle # 5 today.labs okay except Hb 9.8.   # Blood sugars Elevated- 187-  Continue 1000 mg /day; Amaryl  4mg  /day. Improved.  # Hyponatremia- sodium- 130; creat 1.3; off lisinorpil.   # chronic diarrhea-[ "6"]; improved. Continue imodium/ lomotil improved .  # Labs in 1 week/ Lupron/X-geva-   #  MD in 3 weeks/# 6 chemo/labs.

## 2016-03-11 NOTE — Progress Notes (Signed)
[place] OFFICE PROGRESS NOTE  Patient Care Team: Tracie Harrier, MD as PCP - General (Internal Medicine)   SUMMARY OF ONCOLOGIC HISTORY:  Oncology History   # 2014- PROSTATE CA [s/p Bx-Gleason score 5+4; s/p EBRT [Dr.Crystal];July 2015- Biochem Relapse [PSA-15]; non-compliance with Lupron; JAN 2016-PSA 50   # MAY 2016- PSA-100/Bone scan- Bone mets [s/p EBRT Right Hip]; CT A/P- PELVIC/RP LN;   # July 2016- ? 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  Continues to have chronic severe fatigue; continues to have chronic "severe" diarrhea. Improved with Lomotil and Imodium. No weight loss. Denies any tingling and numbness in his legs.  No new swelling in the legs. No shortness of breath.  REVIEW OF SYSTEMS:  A complete 10 point review of system is done which is negative except mentioned above/history of present illness.   PAST MEDICAL HISTORY :  Past Medical History:  Diagnosis Date  . Anxiety   . Cancer [facility])    states he had cancer in his left forearm but is unaware of type and was told his body took care of it  . Chronic kidney disease    stage 3  . Depression   . Essential hypertension, benign   . Family history of adverse reaction to anesthesia     adopted  . Myocardial infarction   . Obesity   . Other and unspecified hyperlipidemia   . PONV (postoperative nausea and vomiting)    nausea  . Prostate cancer ([place])   . Type II or unspecified type diabetes mellitus without mention of complication, uncontrolled   . Varicosities     PAST SURGICAL HISTORY :   Past Surgical History:  Procedure Laterality Date  . CARDIAC CATHETERIZATION    . COLONOSCOPY  2013  . COLONOSCOPY WITH PROPOFOL N/A 01/23/2015   Procedure: COLONOSCOPY WITH PROPOFOL;  Surgeon: Hulen Luster, MD;  Location: [facility] ENDOSCOPY;  Service: Gastroenterology;  Laterality: N/A;  . INTRAOPERATIVE ARTERIOGRAM     for vascular disease  . TONSILLECTOMY    . TOTAL HIP ARTHROPLASTY Right 05/01/2015   Procedure: TOTAL HIP ARTHROPLASTY ANTERIOR APPROACH;  Surgeon: Hessie Knows, MD;  Location: [facility] ORS;  Service: Orthopedics;  Laterality: Right;    FAMILY HISTORY :   Family History  Problem Relation Age of Onset  . Adopted: Yes    SOCIAL HISTORY:   Social History  Substance Use Topics  . Smoking status: Current Every Day Smoker    Packs/day: 0.50    Types: Cigarettes  . Smokeless tobacco: Never Used  . Alcohol use 0.0 oz/week     Comment: 28 12 ounce beers per week    ALLERGIES:  is allergic to codeine sulfate and prednisone.  MEDICATIONS:  Current Outpatient Prescriptions  Medication Sig Dispense  Refill  . ALPRAZolam (XANAX) 0.5 MG tablet Take by mouth.    Marland Kitchen amLODipine (NORVASC) 10 MG tablet Take 10 mg by mouth daily.    Marland Kitchen atenolol (TENORMIN) 100 MG tablet Take 100 mg by mouth daily.    . Cholecalciferol (VITAMIN D) 2000 units tablet     . Cyanocobalamin (RA VITAMIN B-12 TR) 1000 MCG TBCR Take by mouth.    . dexamethasone (DECADRON) 4 MG tablet Take 4 mg by mouth 2 (two) times daily with a meal. Take 1 tablet (4 mg total) by mouth 2 (two) times daily. Start the day before Taxotere. Then daily after chemo for 2 days. - Oral    . diphenoxylate-atropine (LOMOTIL)  2.5-0.025 MG tablet Take 1 tablet by mouth every 8 (eight) hours as needed for diarrhea or loose stools. Take it along with immodium 40 tablet 0  . glimepiride (AMARYL) 4 MG tablet Take 1 tablet (4 mg total) by mouth daily with breakfast. 30 tablet 3  . isosorbide mononitrate (IMDUR) 30 MG 24 hr tablet Take 30 mg by mouth daily.    Marland Kitchen lisinopril (PRINIVIL,ZESTRIL) 20 MG tablet Take 20 mg by mouth daily.     Marland Kitchen loperamide (IMODIUM) 1 MG/5ML solution Take 1 mg by mouth as needed for diarrhea or loose stools.    Marland Kitchen loratadine (CLARITIN) 10 MG tablet Take 10 mg by mouth daily.    . metFORMIN (GLUCOPHAGE) 1000 MG tablet Take 1,000 mg by mouth daily with breakfast.     . Multiple Vitamins-Minerals (MEGA MULTI MEN PO) Take 1 tablet by mouth daily.     . ondansetron (ZOFRAN) 8 MG tablet Take 1 tablet (8 mg total) by mouth 2 (two) times daily as needed for refractory nausea / vomiting. 30 tablet 1  . prochlorperazine (COMPAZINE) 10 MG tablet Take 1 tablet (10 mg total) by mouth every 6 (six) hours as needed (Nausea or vomiting). 30 tablet 1  . simvastatin (ZOCOR) 40 MG tablet Take 40 mg by mouth every evening.    . tamsulosin (FLOMAX) 0.4 MG CAPS capsule Take 1 capsule (0.4 mg total) by mouth daily after supper. 30 capsule 6  . gabapentin (NEURONTIN) 100 MG capsule Take by mouth.     No current facility-administered medications for this visit.     PHYSICAL EXAMINATION: ECOG PERFORMANCE STATUS: 2 - Symptomatic, <50% confined to bed  BP 135/75 (BP Location: Left Arm, Patient Position: Sitting)   Pulse 66   Temp 97.3 F (36.3 C) (Tympanic)   Resp 18   Wt 220 lb (99.8 kg)   SpO2 99%   BMI 33.45 kg/m   Filed Weights   03/11/16 0904  Weight: 220 lb (99.8 kg)    GENERAL: Well-nourished well-developed; Alert, no distress and comfortable.  He can sit on exam table by himself.  Obese.  He is Alone. EYES: no pallor or icterus OROPHARYNX: no thrush or ulceration; good dentition  NECK: supple, no masses  felt LYMPH:  no palpable lymphadenopathy in the cervical, axillary or inguinal regions LUNGS: clear to auscultation and  No wheeze or crackles HEART/CVS: regular rate & rhythm and no murmurs; No lower extremity edema ABDOMEN:abdomen soft, non-tender and normal bowel sounds Musculoskeletal:no cyanosis of digits and no clubbing  PSYCH: alert & oriented x 3 with fluent speech NEURO: no focal motor/sensory deficits SKIN:  no rashes or significant lesions  LABORATORY DATA:  I have reviewed the data as listed    Component Value Date/Time   NA 130 (L) 03/11/2016 0840  K 5.0 03/11/2016 0840   CL 97 (L) 03/11/2016 0840   CO2 22 03/11/2016 0840   GLUCOSE 187 (H) 03/11/2016 0840   BUN 22 (H) 03/11/2016 0840   CREATININE 1.13 03/11/2016 0840   CALCIUM 9.1 03/11/2016 0840   PROT 6.0 (L) 03/11/2016 0840   ALBUMIN 3.6 03/11/2016 0840   AST 23 03/11/2016 0840   ALT 19 03/11/2016 0840   ALKPHOS 63 03/11/2016 0840   BILITOT 0.4 03/11/2016 0840   GFRNONAA >60 03/11/2016 0840   GFRAA >60 03/11/2016 0840    No results found for: SPEP, UPEP  Lab Results  Component Value Date   WBC 9.7 03/11/2016   NEUTROABS 7.8 (H) 03/11/2016   HGB 10.5 (L) 03/11/2016   HCT 30.1 (L) 03/11/2016   MCV 94.3 03/11/2016   PLT 246 03/11/2016      Chemistry      Component Value Date/Time   NA 130 (L) 03/11/2016 0840   K 5.0 03/11/2016 0840   CL 97 (L) 03/11/2016 0840   CO2 22 03/11/2016 0840   BUN 22 (H) 03/11/2016 0840   CREATININE 1.13 03/11/2016 0840      Component Value Date/Time   CALCIUM 9.1 03/11/2016 0840   ALKPHOS 63 03/11/2016 0840   AST 23 03/11/2016 0840   ALT 19 03/11/2016 0840   BILITOT 0.4 03/11/2016 0840       Results for HOBSON, SHOENFELT (MRN [id number]) as of 03/11/2016 09:20  Ref. Range 03/07/2015 13:58 08/22/2015 13:10 10/24/2015 13:00 12/02/2015 14:40 03/04/2016 09:00  PSA Latest Ref Range: 0.00 - 4.00 ng/mL 1.01 7.07 (H) 24.20 (H) 43.69 (H) 68.03 (H)   RADIOGRAPHIC  STUDIES:   ASSESSMENT & PLAN:   Malignant neoplasm of prostate ([place]) # METASTATIC CASTRATE RESISTANT PROSTATE CANCER- On Taxotere palliative chemotherapy s/p #4 Tolerated okay- except extreme fatigue/ diarrhea.   # proceed with cycle # 5 today.labs okay except Hb 9.8.   # Blood sugars Elevated- 187-  Continue 1000 mg /day; Amaryl  4mg  /day. Improved.  # Hyponatremia- sodium- 130; creat 1.3; off lisinorpil.   # chronic diarrhea-[ "6"]; improved. Continue imodium/ lomotil improved .  # Labs in 1 week/ Lupron/X-geva-   #  MD in 3 weeks/# 6 chemo/labs.    Cammie Sickle, MD 03/11/2016 9:33 AM

## 2016-03-18 ENCOUNTER — Inpatient Hospital Stay: Payer: 59

## 2016-03-22 ENCOUNTER — Inpatient Hospital Stay: Payer: 59

## 2016-03-22 ENCOUNTER — Inpatient Hospital Stay: Payer: 59 | Attending: Internal Medicine

## 2016-03-22 DIAGNOSIS — I129 Hypertensive chronic kidney disease with stage 1 through stage 4 chronic kidney disease, or unspecified chronic kidney disease: Secondary | ICD-10-CM | POA: Insufficient documentation

## 2016-03-22 DIAGNOSIS — R197 Diarrhea, unspecified: Secondary | ICD-10-CM | POA: Diagnosis not present

## 2016-03-22 DIAGNOSIS — N183 Chronic kidney disease, stage 3 (moderate): Secondary | ICD-10-CM | POA: Diagnosis not present

## 2016-03-22 DIAGNOSIS — F329 Major depressive disorder, single episode, unspecified: Secondary | ICD-10-CM | POA: Diagnosis not present

## 2016-03-22 DIAGNOSIS — Z79818 Long term (current) use of other agents affecting estrogen receptors and estrogen levels: Secondary | ICD-10-CM | POA: Insufficient documentation

## 2016-03-22 DIAGNOSIS — R5383 Other fatigue: Secondary | ICD-10-CM | POA: Insufficient documentation

## 2016-03-22 DIAGNOSIS — I8390 Asymptomatic varicose veins of unspecified lower extremity: Secondary | ICD-10-CM | POA: Diagnosis not present

## 2016-03-22 DIAGNOSIS — Z7984 Long term (current) use of oral hypoglycemic drugs: Secondary | ICD-10-CM | POA: Insufficient documentation

## 2016-03-22 DIAGNOSIS — Z79899 Other long term (current) drug therapy: Secondary | ICD-10-CM | POA: Insufficient documentation

## 2016-03-22 DIAGNOSIS — F17210 Nicotine dependence, cigarettes, uncomplicated: Secondary | ICD-10-CM | POA: Insufficient documentation

## 2016-03-22 DIAGNOSIS — E1165 Type 2 diabetes mellitus with hyperglycemia: Secondary | ICD-10-CM | POA: Insufficient documentation

## 2016-03-22 DIAGNOSIS — E669 Obesity, unspecified: Secondary | ICD-10-CM | POA: Insufficient documentation

## 2016-03-22 DIAGNOSIS — C61 Malignant neoplasm of prostate: Secondary | ICD-10-CM | POA: Insufficient documentation

## 2016-03-22 DIAGNOSIS — Z5111 Encounter for antineoplastic chemotherapy: Secondary | ICD-10-CM | POA: Diagnosis not present

## 2016-03-22 DIAGNOSIS — E785 Hyperlipidemia, unspecified: Secondary | ICD-10-CM | POA: Diagnosis not present

## 2016-03-22 DIAGNOSIS — E871 Hypo-osmolality and hyponatremia: Secondary | ICD-10-CM | POA: Diagnosis not present

## 2016-03-22 DIAGNOSIS — I252 Old myocardial infarction: Secondary | ICD-10-CM | POA: Diagnosis not present

## 2016-03-22 DIAGNOSIS — Z7689 Persons encountering health services in other specified circumstances: Secondary | ICD-10-CM | POA: Insufficient documentation

## 2016-03-22 DIAGNOSIS — F419 Anxiety disorder, unspecified: Secondary | ICD-10-CM | POA: Insufficient documentation

## 2016-03-22 DIAGNOSIS — C771 Secondary and unspecified malignant neoplasm of intrathoracic lymph nodes: Secondary | ICD-10-CM | POA: Insufficient documentation

## 2016-03-22 DIAGNOSIS — C7951 Secondary malignant neoplasm of bone: Secondary | ICD-10-CM | POA: Diagnosis not present

## 2016-03-22 LAB — CBC WITH DIFFERENTIAL/PLATELET
Basophils Absolute: 0 10*3/uL (ref 0–0.1)
Basophils Relative: 0 %
EOS ABS: 0 10*3/uL (ref 0–0.7)
Eosinophils Relative: 0 %
HCT: 31.2 % — ABNORMAL LOW (ref 40.0–52.0)
HEMOGLOBIN: 10.7 g/dL — AB (ref 13.0–18.0)
LYMPHS ABS: 0.9 10*3/uL — AB (ref 1.0–3.6)
Lymphocytes Relative: 7 %
MCH: 32.6 pg (ref 26.0–34.0)
MCHC: 34.2 g/dL (ref 32.0–36.0)
MCV: 95.4 fL (ref 80.0–100.0)
MONOS PCT: 6 %
Monocytes Absolute: 0.8 10*3/uL (ref 0.2–1.0)
NEUTROS PCT: 87 %
Neutro Abs: 12 10*3/uL — ABNORMAL HIGH (ref 1.4–6.5)
Platelets: 226 10*3/uL (ref 150–440)
RBC: 3.27 MIL/uL — AB (ref 4.40–5.90)
RDW: 16.8 % — ABNORMAL HIGH (ref 11.5–14.5)
WBC: 13.8 10*3/uL — ABNORMAL HIGH (ref 3.8–10.6)

## 2016-03-22 LAB — COMPREHENSIVE METABOLIC PANEL
ALK PHOS: 91 U/L (ref 38–126)
ALT: 17 U/L (ref 17–63)
ANION GAP: 7 (ref 5–15)
AST: 15 U/L (ref 15–41)
Albumin: 3.3 g/dL — ABNORMAL LOW (ref 3.5–5.0)
BILIRUBIN TOTAL: 0.3 mg/dL (ref 0.3–1.2)
BUN: 16 mg/dL (ref 6–20)
CALCIUM: 9.3 mg/dL (ref 8.9–10.3)
CO2: 24 mmol/L (ref 22–32)
CREATININE: 1.41 mg/dL — AB (ref 0.61–1.24)
Chloride: 104 mmol/L (ref 101–111)
GFR, EST NON AFRICAN AMERICAN: 53 mL/min — AB (ref >60)
Glucose, Bld: 192 mg/dL — ABNORMAL HIGH (ref 65–99)
Potassium: 5.3 mmol/L — ABNORMAL HIGH (ref 3.5–5.1)
SODIUM: 135 mmol/L (ref 135–145)
TOTAL PROTEIN: 6.2 g/dL — AB (ref 6.5–8.1)

## 2016-03-22 MED ORDER — LEUPROLIDE ACETATE (4 MONTH) 30 MG IM KIT
30.0000 mg | PACK | Freq: Once | INTRAMUSCULAR | Status: AC
  Administered 2016-03-22: 30 mg via INTRAMUSCULAR
  Filled 2016-03-22: qty 30

## 2016-03-22 MED ORDER — DENOSUMAB 120 MG/1.7ML ~~LOC~~ SOLN
120.0000 mg | Freq: Once | SUBCUTANEOUS | Status: AC
  Administered 2016-03-22: 120 mg via SUBCUTANEOUS
  Filled 2016-03-22: qty 1.7

## 2016-04-01 ENCOUNTER — Inpatient Hospital Stay: Payer: 59

## 2016-04-01 ENCOUNTER — Inpatient Hospital Stay (HOSPITAL_BASED_OUTPATIENT_CLINIC_OR_DEPARTMENT_OTHER): Payer: 59 | Admitting: Internal Medicine

## 2016-04-01 DIAGNOSIS — F419 Anxiety disorder, unspecified: Secondary | ICD-10-CM

## 2016-04-01 DIAGNOSIS — Z79818 Long term (current) use of other agents affecting estrogen receptors and estrogen levels: Secondary | ICD-10-CM

## 2016-04-01 DIAGNOSIS — E669 Obesity, unspecified: Secondary | ICD-10-CM

## 2016-04-01 DIAGNOSIS — R5383 Other fatigue: Secondary | ICD-10-CM

## 2016-04-01 DIAGNOSIS — C61 Malignant neoplasm of prostate: Secondary | ICD-10-CM

## 2016-04-01 DIAGNOSIS — Z7689 Persons encountering health services in other specified circumstances: Secondary | ICD-10-CM

## 2016-04-01 DIAGNOSIS — I252 Old myocardial infarction: Secondary | ICD-10-CM

## 2016-04-01 DIAGNOSIS — Z7984 Long term (current) use of oral hypoglycemic drugs: Secondary | ICD-10-CM

## 2016-04-01 DIAGNOSIS — R197 Diarrhea, unspecified: Secondary | ICD-10-CM

## 2016-04-01 DIAGNOSIS — I8390 Asymptomatic varicose veins of unspecified lower extremity: Secondary | ICD-10-CM

## 2016-04-01 DIAGNOSIS — Z79899 Other long term (current) drug therapy: Secondary | ICD-10-CM

## 2016-04-01 DIAGNOSIS — F329 Major depressive disorder, single episode, unspecified: Secondary | ICD-10-CM

## 2016-04-01 DIAGNOSIS — C7951 Secondary malignant neoplasm of bone: Secondary | ICD-10-CM

## 2016-04-01 DIAGNOSIS — E1165 Type 2 diabetes mellitus with hyperglycemia: Secondary | ICD-10-CM

## 2016-04-01 DIAGNOSIS — N183 Chronic kidney disease, stage 3 (moderate): Secondary | ICD-10-CM

## 2016-04-01 DIAGNOSIS — C771 Secondary and unspecified malignant neoplasm of intrathoracic lymph nodes: Secondary | ICD-10-CM

## 2016-04-01 DIAGNOSIS — E871 Hypo-osmolality and hyponatremia: Secondary | ICD-10-CM

## 2016-04-01 DIAGNOSIS — I129 Hypertensive chronic kidney disease with stage 1 through stage 4 chronic kidney disease, or unspecified chronic kidney disease: Secondary | ICD-10-CM

## 2016-04-01 DIAGNOSIS — E785 Hyperlipidemia, unspecified: Secondary | ICD-10-CM

## 2016-04-01 DIAGNOSIS — F17210 Nicotine dependence, cigarettes, uncomplicated: Secondary | ICD-10-CM

## 2016-04-01 LAB — CBC WITH DIFFERENTIAL/PLATELET
BASOS PCT: 1 %
Basophils Absolute: 0.1 10*3/uL (ref 0–0.1)
Eosinophils Absolute: 0 10*3/uL (ref 0–0.7)
Eosinophils Relative: 0 %
HEMATOCRIT: 32.8 % — AB (ref 40.0–52.0)
HEMOGLOBIN: 11.2 g/dL — AB (ref 13.0–18.0)
LYMPHS ABS: 0.6 10*3/uL — AB (ref 1.0–3.6)
LYMPHS PCT: 5 %
MCH: 33 pg (ref 26.0–34.0)
MCHC: 34.3 g/dL (ref 32.0–36.0)
MCV: 96.2 fL (ref 80.0–100.0)
MONO ABS: 0.4 10*3/uL (ref 0.2–1.0)
MONOS PCT: 4 %
NEUTROS ABS: 9.6 10*3/uL — AB (ref 1.4–6.5)
NEUTROS PCT: 90 %
Platelets: 275 10*3/uL (ref 150–440)
RBC: 3.41 MIL/uL — ABNORMAL LOW (ref 4.40–5.90)
RDW: 17.8 % — AB (ref 11.5–14.5)
WBC: 10.6 10*3/uL (ref 3.8–10.6)

## 2016-04-01 LAB — COMPREHENSIVE METABOLIC PANEL
ALBUMIN: 3.7 g/dL (ref 3.5–5.0)
ALK PHOS: 60 U/L (ref 38–126)
ALT: 23 U/L (ref 17–63)
ANION GAP: 9 (ref 5–15)
AST: 26 U/L (ref 15–41)
BILIRUBIN TOTAL: 0.5 mg/dL (ref 0.3–1.2)
BUN: 16 mg/dL (ref 6–20)
CALCIUM: 8.7 mg/dL — AB (ref 8.9–10.3)
CO2: 21 mmol/L — ABNORMAL LOW (ref 22–32)
Chloride: 102 mmol/L (ref 101–111)
Creatinine, Ser: 1.08 mg/dL (ref 0.61–1.24)
GFR calc Af Amer: >60 mL/min (ref >60)
GLUCOSE: 230 mg/dL — AB (ref 65–99)
POTASSIUM: 4.6 mmol/L (ref 3.5–5.1)
Sodium: 132 mmol/L — ABNORMAL LOW (ref 135–145)
TOTAL PROTEIN: 6.6 g/dL (ref 6.5–8.1)

## 2016-04-01 MED ORDER — SODIUM CHLORIDE 0.9 % IV SOLN
Freq: Once | INTRAVENOUS | Status: AC
  Administered 2016-04-01: 11:00:00 via INTRAVENOUS
  Filled 2016-04-01: qty 1000

## 2016-04-01 MED ORDER — SODIUM CHLORIDE 0.9 % IV SOLN: 10.0000 mg | Freq: Once | INTRAVENOUS | Status: DC

## 2016-04-01 MED ORDER — DIPHENOXYLATE-ATROPINE 2.5-0.025 MG PO TABS: 1.0000 | ORAL_TABLET | Freq: Four times a day (QID) | ORAL | 0 refills | Status: DC | PRN

## 2016-04-01 MED ORDER — DOCETAXEL CHEMO INJECTION 160 MG/16ML
60.0000 mg/m2 | Freq: Once | INTRAVENOUS | Status: AC
  Administered 2016-04-01: 130 mg via INTRAVENOUS
  Filled 2016-04-01: qty 13

## 2016-04-01 MED ORDER — DEXAMETHASONE SODIUM PHOSPHATE 10 MG/ML IJ SOLN
10.0000 mg | Freq: Once | INTRAMUSCULAR | Status: AC
  Administered 2016-04-01: 10 mg via INTRAVENOUS
  Filled 2016-04-01: qty 1

## 2016-04-01 MED ORDER — PEGFILGRASTIM 6 MG/0.6ML ~~LOC~~ PSKT
6.0000 mg | PREFILLED_SYRINGE | Freq: Once | SUBCUTANEOUS | Status: AC
  Administered 2016-04-01: 6 mg via SUBCUTANEOUS
  Filled 2016-04-01: qty 0.6

## 2016-04-01 NOTE — Assessment & Plan Note (Addendum)
#   METASTATIC CASTRATE RESISTANT PROSTATE CANCER- On Taxotere palliative chemotherapy s/p #5 Tolerated okay- except extreme fatigue/ diarrhea. Lupron last 11/06  # proceed with cycle # 6 today.labs okay except Hb 11.2.  The next option would be used to incorporate xofigo.   # Blood sugars Elevated- 230s-  Continue 1000 mg /day; Amaryl  4mg  /day.   # Hyponatremia- sodium- 130; creat 1.1; continue off lisinorpil.   # chronic diarrhea-[ "6"]; improved.stable. new script for lomotil given.   # Follow up with MD/X-geva. in 6 weeks/ Labs/ PSA 1 week before.

## 2016-04-01 NOTE — Progress Notes (Signed)
Patient continues to have 10+ stools- loose / day.

## 2016-04-01 NOTE — Progress Notes (Signed)
[place] OFFICE PROGRESS NOTE  Patient Care Team: Tracie Harrier, MD as PCP - General (Internal Medicine)   SUMMARY OF ONCOLOGIC HISTORY:  Oncology History   # 2014- PROSTATE CA [s/p Bx-Gleason score 5+4; s/p EBRT [Dr.Crystal];July 2015- Biochem Relapse [PSA-15]; non-compliance with Lupron; JAN 2016-PSA 50   # MAY 2016- PSA-100/Bone scan- Bone mets [s/p EBRT Right Hip]; CT A/P- PELVIC/RP LN;   # July 2016- ? 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  Patient continues to complain of chronic severe fatigue; he also complains of loose stools chronic"6-10 a day" which she attributes to his steroids for his Taxotere. He states he has been taking Lomotil every 8 hours. No weight loss. Denies any tingling and numbness in his legs.  No new swelling in the legs. No shortness of breath.  REVIEW OF SYSTEMS:  A complete 10 point review of system is done which is negative except mentioned above/history of present illness.   PAST MEDICAL HISTORY :  Past Medical History:  Diagnosis Date  . Anxiety   . Cancer [facility])    states he had cancer in his left forearm but is unaware of type and was told his body took care of it  . Chronic kidney disease    stage 3  .  Depression   . Essential hypertension, benign   . Family history of adverse reaction to anesthesia    adopted  . Myocardial infarction   . Obesity   . Other and unspecified hyperlipidemia   . PONV (postoperative nausea and vomiting)    nausea  . Prostate cancer ([place])   . Type II or unspecified type diabetes mellitus without mention of complication, uncontrolled   . Varicosities     PAST SURGICAL HISTORY :   Past Surgical History:  Procedure Laterality Date  . CARDIAC CATHETERIZATION    . COLONOSCOPY  2013  . COLONOSCOPY WITH PROPOFOL N/A 01/23/2015   Procedure: COLONOSCOPY WITH PROPOFOL;  Surgeon: Hulen Luster, MD;  Location[facility] ENDOSCOPY;  Service: Gastroenterology;  Laterality: N/A;  . INTRAOPERATIVE ARTERIOGRAM     for vascular disease  . TONSILLECTOMY    . TOTAL HIP ARTHROPLASTY Right 05/01/2015   Procedure: TOTAL HIP ARTHROPLASTY ANTERIOR APPROACH;  Surgeon: Hessie Knows, MD;  Location: [facility] ORS;  Service: Orthopedics;  Laterality: Right;    FAMILY HISTORY :   Family History  Problem Relation Age of Onset  . Adopted: Yes    SOCIAL HISTORY:   Social History  Substance Use Topics  . Smoking status: Current Every Day Smoker    Packs/day: 0.50    Types: Cigarettes  . Smokeless tobacco: Never Used  . Alcohol use 0.0 oz/week     Comment: 28 12 ounce beers per week  ALLERGIES:  is allergic to codeine sulfate and prednisone.  MEDICATIONS:  Current Outpatient Prescriptions  Medication Sig Dispense Refill  . ALPRAZolam (XANAX) 0.5 MG tablet Take 0.5 mg by mouth 2 (two) times daily as needed for sleep.     Marland Kitchen amLODipine (NORVASC) 10 MG tablet Take 10 mg by mouth daily.    Marland Kitchen atenolol (TENORMIN) 100 MG tablet Take 100 mg by mouth daily.    . Cholecalciferol (VITAMIN D) 2000 units tablet Take 2,000 Units by mouth daily.     . Cyanocobalamin (RA VITAMIN B-12 TR) 1000 MCG TBCR Take 1 tablet by mouth daily.     Marland Kitchen dexamethasone (DECADRON) 4 MG tablet Take 4 mg by mouth 2 (two)  times daily with a meal. Take 1 tablet (4 mg total) by mouth 2 (two) times daily. Start the day before Taxotere. Then daily after chemo for 2 days. - Oral    . diphenoxylate-atropine (LOMOTIL) 2.5-0.025 MG tablet Take 1 tablet by mouth 4 (four) times daily as needed for diarrhea or loose stools. Take it along with immodium 40 tablet 0  . glimepiride (AMARYL) 4 MG tablet Take 1 tablet (4 mg total) by mouth daily with breakfast. 30 tablet 3  . isosorbide mononitrate (IMDUR) 30 MG 24 hr tablet Take 30 mg by mouth daily.    Marland Kitchen lisinopril (PRINIVIL,ZESTRIL) 20 MG tablet Take 20 mg by mouth daily.     Marland Kitchen loperamide (IMODIUM) 1 MG/5ML solution Take 1 mg by mouth as needed for diarrhea or loose stools.    Marland Kitchen loratadine (CLARITIN) 10 MG tablet Take 10 mg by mouth daily.    . metFORMIN (GLUCOPHAGE) 1000 MG tablet Take 1,000 mg by mouth daily with breakfast.     . Multiple Vitamins-Minerals (MEGA MULTI MEN PO) Take 1 tablet by mouth daily.     . prochlorperazine (COMPAZINE) 10 MG tablet Take 1 tablet (10 mg total) by mouth every 6 (six) hours as needed (Nausea or vomiting). 30 tablet 1  . simvastatin (ZOCOR) 40 MG tablet Take 40 mg by mouth every evening.    . tamsulosin (FLOMAX) 0.4 MG CAPS capsule Take 1 capsule (0.4 mg total) by mouth daily after supper. 30 capsule 6  . gabapentin (NEURONTIN) 100 MG capsule Take 100 mg by mouth daily.     . ondansetron (ZOFRAN) 8 MG tablet Take 1 tablet (8 mg total) by mouth 2 (two) times daily as needed for refractory nausea / vomiting. (Patient not taking: Reported on 04/01/2016) 30 tablet 1   No current facility-administered medications for this visit.     PHYSICAL EXAMINATION: ECOG PERFORMANCE STATUS: 2 - Symptomatic, <50% confined to bed  There were no vitals taken for this visit.  There were no vitals filed for this visit.  GENERAL: Well-nourished well-developed; Alert, no distress and comfortable.  He can sit on exam table by himself.  Obese.  He is  Alone. EYES: no pallor or icterus OROPHARYNX: no thrush or ulceration; good dentition  NECK: supple, no masses felt LYMPH:  no palpable lymphadenopathy in the cervical, axillary or inguinal regions LUNGS: clear to auscultation and  No wheeze or crackles HEART/CVS: regular rate & rhythm and no murmurs; No lower extremity edema ABDOMEN:abdomen soft, non-tender and normal bowel sounds Musculoskeletal:no cyanosis of digits and no clubbing  PSYCH: alert & oriented x 3 with fluent speech NEURO: no focal motor/sensory deficits SKIN:  no rashes or significant lesions  LABORATORY DATA:  I have reviewed the data as listed  Component Value Date/Time   NA 132 (L) 04/01/2016 0840   K 4.6 04/01/2016 0840   CL 102 04/01/2016 0840   CO2 21 (L) 04/01/2016 0840   GLUCOSE 230 (H) 04/01/2016 0840   BUN 16 04/01/2016 0840   CREATININE 1.08 04/01/2016 0840   CALCIUM 8.7 (L) 04/01/2016 0840   PROT 6.6 04/01/2016 0840   ALBUMIN 3.7 04/01/2016 0840   AST 26 04/01/2016 0840   ALT 23 04/01/2016 0840   ALKPHOS 60 04/01/2016 0840   BILITOT 0.5 04/01/2016 0840   GFRNONAA >60 04/01/2016 0840   GFRAA >60 04/01/2016 0840    No results found for: SPEP, UPEP  Lab Results  Component Value Date   WBC 10.6 04/01/2016   NEUTROABS 9.6 (H) 04/01/2016   HGB 11.2 (L) 04/01/2016   HCT 32.8 (L) 04/01/2016   MCV 96.2 04/01/2016   PLT 275 04/01/2016      Chemistry      Component Value Date/Time   NA 132 (L) 04/01/2016 0840   K 4.6 04/01/2016 0840   CL 102 04/01/2016 0840   CO2 21 (L) 04/01/2016 0840   BUN 16 04/01/2016 0840   CREATININE 1.08 04/01/2016 0840      Component Value Date/Time   CALCIUM 8.7 (L) 04/01/2016 0840   ALKPHOS 60 04/01/2016 0840   AST 26 04/01/2016 0840   ALT 23 04/01/2016 0840   BILITOT 0.5 04/01/2016 0840       Results for VITO, EBERLING (MRN [id number]) as of 03/11/2016 09:20  Ref. Range 03/07/2015 13:58 08/22/2015 13:10 10/24/2015 13:00 12/02/2015 14:40 03/04/2016 09:00  PSA  Latest Ref Range: 0.00 - 4.00 ng/mL 1.01 7.07 (H) 24.20 (H) 43.69 (H) 68.03 (H)   RADIOGRAPHIC STUDIES:   ASSESSMENT & PLAN:   Malignant neoplasm of prostate ([place]) # METASTATIC CASTRATE RESISTANT PROSTATE CANCER- On Taxotere palliative chemotherapy s/p #5 Tolerated okay- except extreme fatigue/ diarrhea. Lupron last 11/06  # proceed with cycle # 6 today.labs okay except Hb 11.2.  The next option would be used to incorporate xofigo.   # Blood sugars Elevated- 230s-  Continue 1000 mg /day; Amaryl  4mg  /day.   # Hyponatremia- sodium- 130; creat 1.1; continue off lisinorpil.   # chronic diarrhea-[ "6"]; improved.stable. new script for lomotil given.   # Follow up with MD/X-geva. in 6 weeks/ Labs/ PSA 1 week before.     Cammie Sickle, MD 04/01/2016 5:10 PM

## 2016-05-06 ENCOUNTER — Inpatient Hospital Stay: Payer: 59 | Attending: Oncology

## 2016-05-06 DIAGNOSIS — C61 Malignant neoplasm of prostate: Secondary | ICD-10-CM | POA: Insufficient documentation

## 2016-05-06 LAB — CBC WITH DIFFERENTIAL/PLATELET
BASOS ABS: 0.1 10*3/uL (ref 0–0.1)
BASOS PCT: 1 %
Eosinophils Absolute: 0.7 10*3/uL (ref 0–0.7)
Eosinophils Relative: 10 %
HEMATOCRIT: 34.1 % — AB (ref 40.0–52.0)
HEMOGLOBIN: 11.7 g/dL — AB (ref 13.0–18.0)
Lymphocytes Relative: 13 %
Lymphs Abs: 0.9 10*3/uL — ABNORMAL LOW (ref 1.0–3.6)
MCH: 32.7 pg (ref 26.0–34.0)
MCHC: 34.3 g/dL (ref 32.0–36.0)
MCV: 95.1 fL (ref 80.0–100.0)
MONO ABS: 0.7 10*3/uL (ref 0.2–1.0)
Monocytes Relative: 9 %
NEUTROS ABS: 5 10*3/uL (ref 1.4–6.5)
NEUTROS PCT: 67 %
Platelets: 278 10*3/uL (ref 150–440)
RBC: 3.58 MIL/uL — ABNORMAL LOW (ref 4.40–5.90)
RDW: 16 % — AB (ref 11.5–14.5)
WBC: 7.4 10*3/uL (ref 3.8–10.6)

## 2016-05-06 LAB — COMPREHENSIVE METABOLIC PANEL
ALBUMIN: 3.5 g/dL (ref 3.5–5.0)
ALT: 16 U/L — ABNORMAL LOW (ref 17–63)
AST: 18 U/L (ref 15–41)
Alkaline Phosphatase: 80 U/L (ref 38–126)
Anion gap: 6 (ref 5–15)
BILIRUBIN TOTAL: 0.4 mg/dL (ref 0.3–1.2)
BUN: 19 mg/dL (ref 6–20)
CO2: 23 mmol/L (ref 22–32)
Calcium: 9.1 mg/dL (ref 8.9–10.3)
Chloride: 104 mmol/L (ref 101–111)
Creatinine, Ser: 1.03 mg/dL (ref 0.61–1.24)
GFR calc Af Amer: >60 mL/min (ref >60)
GFR calc non Af Amer: >60 mL/min (ref >60)
GLUCOSE: 190 mg/dL — AB (ref 65–99)
POTASSIUM: 4.5 mmol/L (ref 3.5–5.1)
Sodium: 133 mmol/L — ABNORMAL LOW (ref 135–145)
TOTAL PROTEIN: 6.6 g/dL (ref 6.5–8.1)

## 2016-05-13 ENCOUNTER — Inpatient Hospital Stay: Payer: 59 | Admitting: Oncology

## 2016-05-13 ENCOUNTER — Inpatient Hospital Stay: Payer: 59

## 2016-05-14 ENCOUNTER — Emergency Department: Payer: 59

## 2016-05-14 ENCOUNTER — Encounter: Payer: Self-pay | Admitting: Emergency Medicine

## 2016-05-14 ENCOUNTER — Emergency Department
Admission: EM | Admit: 2016-05-14 | Discharge: 2016-05-14 | Disposition: A | Discharge status: Home / Self Care | Payer: 59 | Attending: Emergency Medicine | Admitting: Emergency Medicine

## 2016-05-14 DIAGNOSIS — N183 Chronic kidney disease, stage 3 (moderate): Secondary | ICD-10-CM | POA: Diagnosis not present

## 2016-05-14 DIAGNOSIS — E1122 Type 2 diabetes mellitus with diabetic chronic kidney disease: Secondary | ICD-10-CM | POA: Insufficient documentation

## 2016-05-14 DIAGNOSIS — I129 Hypertensive chronic kidney disease with stage 1 through stage 4 chronic kidney disease, or unspecified chronic kidney disease: Secondary | ICD-10-CM | POA: Insufficient documentation

## 2016-05-14 DIAGNOSIS — Z8546 Personal history of malignant neoplasm of prostate: Secondary | ICD-10-CM | POA: Insufficient documentation

## 2016-05-14 DIAGNOSIS — M79605 Pain in left leg: Secondary | ICD-10-CM | POA: Diagnosis present

## 2016-05-14 DIAGNOSIS — Z79899 Other long term (current) drug therapy: Secondary | ICD-10-CM | POA: Diagnosis not present

## 2016-05-14 DIAGNOSIS — F17210 Nicotine dependence, cigarettes, uncomplicated: Secondary | ICD-10-CM | POA: Insufficient documentation

## 2016-05-14 DIAGNOSIS — I82812 Embolism and thrombosis of superficial veins of left lower extremities: Secondary | ICD-10-CM | POA: Insufficient documentation

## 2016-05-14 DIAGNOSIS — Z7984 Long term (current) use of oral hypoglycemic drugs: Secondary | ICD-10-CM | POA: Insufficient documentation

## 2016-05-14 DIAGNOSIS — M7989 Other specified soft tissue disorders: Secondary | ICD-10-CM

## 2016-05-14 DIAGNOSIS — I8002 Phlebitis and thrombophlebitis of superficial vessels of left lower extremity: Secondary | ICD-10-CM

## 2016-05-14 DIAGNOSIS — I2510 Atherosclerotic heart disease of native coronary artery without angina pectoris: Secondary | ICD-10-CM | POA: Diagnosis not present

## 2016-05-14 DIAGNOSIS — M79652 Pain in left thigh: Secondary | ICD-10-CM

## 2016-05-14 LAB — PHOSPHORUS: Phosphorus: 4.6 mg/dL (ref 2.5–4.6)

## 2016-05-14 LAB — BASIC METABOLIC PANEL
Anion gap: 11 (ref 5–15)
BUN: 23 mg/dL — ABNORMAL HIGH (ref 6–20)
CO2: 21 mmol/L — AB (ref 22–32)
Calcium: 8.9 mg/dL (ref 8.9–10.3)
Chloride: 97 mmol/L — ABNORMAL LOW (ref 101–111)
Creatinine, Ser: 1.1 mg/dL (ref 0.61–1.24)
GFR calc Af Amer: >60 mL/min (ref >60)
Glucose, Bld: 182 mg/dL — ABNORMAL HIGH (ref 65–99)
POTASSIUM: 5.2 mmol/L — AB (ref 3.5–5.1)
Sodium: 129 mmol/L — ABNORMAL LOW (ref 135–145)

## 2016-05-14 LAB — CBC WITH DIFFERENTIAL/PLATELET
BASOS ABS: 0.1 10*3/uL (ref 0–0.1)
Basophils Relative: 1 %
EOS ABS: 0.3 10*3/uL (ref 0–0.7)
Eosinophils Relative: 3 %
HCT: 35.1 % — ABNORMAL LOW (ref 40.0–52.0)
HEMOGLOBIN: 12 g/dL — AB (ref 13.0–18.0)
LYMPHS ABS: 0.7 10*3/uL — AB (ref 1.0–3.6)
LYMPHS PCT: 7 %
MCH: 32.5 pg (ref 26.0–34.0)
MCHC: 34 g/dL (ref 32.0–36.0)
MCV: 95.5 fL (ref 80.0–100.0)
Monocytes Absolute: 0.9 10*3/uL (ref 0.2–1.0)
Monocytes Relative: 10 %
NEUTROS PCT: 79 %
Neutro Abs: 7.7 10*3/uL — ABNORMAL HIGH (ref 1.4–6.5)
Platelets: 290 10*3/uL (ref 150–440)
RBC: 3.68 MIL/uL — AB (ref 4.40–5.90)
RDW: 16.2 % — ABNORMAL HIGH (ref 11.5–14.5)
WBC: 9.7 10*3/uL (ref 3.8–10.6)

## 2016-05-14 LAB — MAGNESIUM: MAGNESIUM: 2 mg/dL (ref 1.7–2.4)

## 2016-05-14 MED ORDER — NAPROXEN 500 MG PO TABS: 500.0000 mg | ORAL_TABLET | Freq: Two times a day (BID) | ORAL | 0 refills | Status: DC

## 2016-05-14 MED ORDER — ONDANSETRON 8 MG PO TBDP: 8.0000 mg | ORAL_TABLET | Freq: Three times a day (TID) | ORAL | 0 refills | Status: DC | PRN

## 2016-05-14 MED ORDER — HYDROMORPHONE HCL 1 MG/ML IJ SOLN
1.0000 mg | Freq: Once | INTRAMUSCULAR | Status: AC
  Administered 2016-05-14: 1 mg via INTRAVENOUS
  Filled 2016-05-14: qty 1

## 2016-05-14 MED ORDER — HYDROMORPHONE HCL 2 MG PO TABS: 2.0000 mg | ORAL_TABLET | Freq: Four times a day (QID) | ORAL | 0 refills | Status: DC | PRN

## 2016-05-14 MED ORDER — HYDROMORPHONE HCL 2 MG PO TABS
2.0000 mg | ORAL_TABLET | ORAL | Status: AC
  Administered 2016-05-14: 2 mg via ORAL
  Filled 2016-05-14: qty 1

## 2016-05-14 MED ORDER — ONDANSETRON 4 MG PO TBDP
8.0000 mg | ORAL_TABLET | Freq: Once | ORAL | Status: AC
  Administered 2016-05-14: 8 mg via ORAL
  Filled 2016-05-14: qty 2

## 2016-05-14 NOTE — ED Triage Notes (Signed)
Patient states that he has been having pain in his left thigh for about 2 weeks, pain has been worse over the past 3 days. Patient states that he took Morphine that he had left over at home and this only helped for about 1 hour. Patient denies redness or swelling to the area. Patient denies injury. Patient denies numbness and tingling in the leg.

## 2016-05-14 NOTE — Discharge Instructions (Signed)
Your ultrasound today reveals a superficial blood clot in the saphenous vein of the left thigh. This is not a deep vein thrombosis. Take naproxen twice a day, use a heating pad multiple times a day, and continue light activity and walking. If your symptoms are not resolved in a week, you should see your doctor or return to the ER for repeat ultrasound.  Return immediately if you develop fever, chest pain, shortness of breath, dizziness, or passing out.

## 2016-05-14 NOTE — ED Provider Notes (Signed)
[facility] Emergency Department Provider Note  ____________________________________________  Time seen: Approximately 8:30 PM  I have reviewed the triage vital signs and the nursing notes.   HISTORY  Chief Complaint Leg Pain    HPI Sean Petersen is a 60 y.o. male who complains of left thigh pain for the past 2 weeks, worsening for the past 3 days. He can bear weight, but pain is very intense when he is walking. Denies any leg swelling or redness. No fever chest pain or shortness of breath. He does have a history of metastatic prostate cancer involving the bones, he is on chemotherapy.     Past Medical History:  Diagnosis Date  . Anxiety   . Cancer [facility])    states he had cancer in his left forearm but is unaware of type and was told his body took care of it  . Chronic kidney disease    stage 3  . Depression   . Essential hypertension, benign   . Family history of adverse reaction to anesthesia    adopted  . Myocardial infarction   . Obesity   . Other and unspecified hyperlipidemia   . PONV (postoperative nausea and vomiting)    nausea  . Prostate cancer ([place])   . Type II or unspecified type diabetes mellitus without mention of complication, uncontrolled   . Varicosities      Patient Active Problem List   Diagnosis Date Noted  . Dehydration 01/20/2016  . Diarrhea 12/04/2015  . Anxiety 08/22/2015  . Chronic kidney disease (CKD), stage III (moderate) 08/22/2015  . BP (high blood pressure) 08/22/2015  . Current tobacco use 08/22/2015  . Type 2 diabetes mellitus ([place]) 08/22/2015  . Seizure disorder ([place]) 08/01/2015  . Tobacco use disorder, continuous 08/01/2015  . Primary osteoarthritis of right hip 05/01/2015  . 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 CAD in native artery 05/14/2014  . Malignant neoplasm of prostate ([place]) 02/23/2013  . Myocardial infarction 05/17/2002     Past Surgical History:   Procedure Laterality Date  . CARDIAC CATHETERIZATION    . COLONOSCOPY  2013  . COLONOSCOPY WITH PROPOFOL N/A 01/23/2015   Procedure: COLONOSCOPY WITH PROPOFOL;  Surgeon: Hulen Luster, MD;  Location: [facility] ENDOSCOPY;  Service: Gastroenterology;  Laterality: N/A;  . INTRAOPERATIVE ARTERIOGRAM     for vascular disease  . TONSILLECTOMY    . TOTAL HIP ARTHROPLASTY Right 05/01/2015   Procedure: TOTAL HIP ARTHROPLASTY ANTERIOR APPROACH;  Surgeon: Hessie Knows, MD;  Location: [facility] ORS;  Service: Orthopedics;  Laterality: Right;     Prior to Admission medications   Medication Sig Start Date End Date Taking? Authorizing Provider  ALPRAZolam Duanne Moron) 0.5 MG tablet Take 0.5 mg by mouth 2 (two) times daily as needed for sleep.  08/01/15   Historical Provider, MD  amLODipine (NORVASC) 10 MG tablet Take 10 mg by mouth daily.    Historical Provider, MD  atenolol (TENORMIN) 100 MG tablet Take 100 mg by mouth daily.    Historical Provider, MD  Cholecalciferol (VITAMIN D) 2000 units tablet Take 2,000 Units by mouth daily.  08/01/15   Historical Provider, MD  Cyanocobalamin (RA VITAMIN B-12 TR) 1000 MCG TBCR Take 1 tablet by mouth daily.     Historical Provider, MD  dexamethasone (DECADRON) 4 MG tablet Take 4 mg by mouth 2 (two) times daily with a meal. Take 1 tablet (4 mg total) by mouth 2 (two) times daily. Start the day before Taxotere.  Then daily after chemo for 2 days. - Oral    Historical Provider, MD  diphenoxylate-atropine (LOMOTIL) 2.5-0.025 MG tablet Take 1 tablet by mouth 4 (four) times daily as needed for diarrhea or loose stools. Take it along with immodium 04/01/16   Cammie Sickle, MD  gabapentin (NEURONTIN) 100 MG capsule Take 100 mg by mouth daily.  05/14/14 05/14/15  Historical Provider, MD  glimepiride (AMARYL) 4 MG tablet Take 1 tablet (4 mg total) by mouth daily with breakfast. 01/29/16 01/28/17  Cammie Sickle, MD  HYDROmorphone (DILAUDID) 2 MG tablet Take 1 tablet (2 mg total) by mouth  every 6 (six) hours as needed for severe pain. 05/14/16 05/14/17  Carrie Mew, MD  isosorbide mononitrate (IMDUR) 30 MG 24 hr tablet Take 30 mg by mouth daily.    Historical Provider, MD  lisinopril (PRINIVIL,ZESTRIL) 20 MG tablet Take 20 mg by mouth daily.  12/01/12   Historical Provider, MD  loperamide (IMODIUM) 1 MG/5ML solution Take 1 mg by mouth as needed for diarrhea or loose stools.    Historical Provider, MD  loratadine (CLARITIN) 10 MG tablet Take 10 mg by mouth daily.    Historical Provider, MD  metFORMIN (GLUCOPHAGE) 1000 MG tablet Take 1,000 mg by mouth daily with breakfast.  03/10/15   Historical Provider, MD  Multiple Vitamins-Minerals (MEGA MULTI MEN PO) Take 1 tablet by mouth daily.     Historical Provider, MD  naproxen (NAPROSYN) 500 MG tablet Take 1 tablet (500 mg total) by mouth 2 (two) times daily with a meal. 05/14/16   Carrie Mew, MD  ondansetron (ZOFRAN ODT) 8 MG disintegrating tablet Take 1 tablet (8 mg total) by mouth every 8 (eight) hours as needed for nausea or vomiting. 05/14/16   Carrie Mew, MD  ondansetron (ZOFRAN) 8 MG tablet Take 1 tablet (8 mg total) by mouth 2 (two) times daily as needed for refractory nausea / vomiting. Patient not taking: Reported on 04/01/2016 12/04/15   Cammie Sickle, MD  prochlorperazine (COMPAZINE) 10 MG tablet Take 1 tablet (10 mg total) by mouth every 6 (six) hours as needed (Nausea or vomiting). 12/04/15   Cammie Sickle, MD  simvastatin (ZOCOR) 40 MG tablet Take 40 mg by mouth every evening.    Historical Provider, MD  tamsulosin (FLOMAX) 0.4 MG CAPS capsule Take 1 capsule (0.4 mg total) by mouth daily after supper. 10/24/15   Noreene Filbert, MD     Allergies Codeine sulfate and Prednisone   Family History  Problem Relation Age of Onset  . Adopted: Yes    Social History Social History  Substance Use Topics  . Smoking status: Current Every Day Smoker    Packs/day: 0.50    Types: Cigarettes  .  Smokeless tobacco: Never Used  . Alcohol use 0.0 oz/week     Comment: 28 12 ounce beers per week    Review of Systems  Constitutional:   No fever or chills.  ENT:   No sore throat. No rhinorrhea. Cardiovascular:   No chest pain. Respiratory:   No dyspnea or cough. Gastrointestinal:   Negative for abdominal pain, vomiting and diarrhea.  Genitourinary:   Negative for dysuria or difficulty urinating. Musculoskeletal:   Left thigh pain Neurological:   Negative for headaches 10-point ROS otherwise negative.  ____________________________________________   PHYSICAL EXAM:  VITAL SIGNS: ED Triage Vitals  Enc Vitals Group     BP 05/14/16 1447 124/65     Pulse Rate 05/14/16 1446 70  Resp 05/14/16 1446 16     Temp 05/14/16 1446 99 F (37.2 C)     Temp Source 05/14/16 1446 Oral     SpO2 05/14/16 1446 98 %     Weight 05/14/16 1445 220 lb (99.8 kg)     Height 05/14/16 1445 5\' 8"  (1.727 m)     Head Circumference --      Peak Flow --      Pain Score 05/14/16 1445 10     Pain Loc --      Pain Edu? --      Excl. in [place]? --     Vital signs reviewed, nursing assessments reviewed.   Constitutional:   Alert and oriented. Well appearing and in no distress. Eyes:   No scleral icterus. No conjunctival pallor. PERRL. EOMI.  No nystagmus. ENT   Head:   Normocephalic and atraumatic.   Nose:   No congestion/rhinnorhea. No septal hematoma   Mouth/Throat:   MMM, no pharyngeal erythema. No peritonsillar mass.    Neck:   No stridor. No SubQ emphysema. No meningismus. Hematological/Lymphatic/Immunilogical:   No cervical lymphadenopathy. Cardiovascular:   RRR. Symmetric bilateral radial and DP pulses.  No murmurs.  Respiratory:   Normal respiratory effort without tachypnea nor retractions. Breath sounds are clear and equal bilaterally. No wheezes/rales/rhonchi. Gastrointestinal:   Soft and nontender. Non distended. There is no CVA tenderness.  No rebound, rigidity, or  guarding. Genitourinary:   deferred Musculoskeletal:   Tenderness along the mid to distal left thigh on the lateral aspect. No edema erythema or significant swelling. Negative Homans sign. No palpable cords. There is diffuse venous varicosity on bilateral lower extremities.. Neurologic:   Normal speech and language.  CN 2-10 normal. Motor grossly intact. No gross focal neurologic deficits are appreciated.  Skin:    Skin is warm, dry and intact. No rash noted.  No petechiae, purpura, or bullae.  ____________________________________________    LABS (pertinent positives/negatives) (all labs ordered are listed, but only abnormal results are displayed) Labs Reviewed  BASIC METABOLIC PANEL - Abnormal; Notable for the following:       Result Value   Sodium 129 (*)    Potassium 5.2 (*)    Chloride 97 (*)    CO2 21 (*)    Glucose, Bld 182 (*)    BUN 23 (*)    All other components within normal limits  CBC WITH DIFFERENTIAL/PLATELET - Abnormal; Notable for the following:    RBC 3.68 (*)    Hemoglobin 12.0 (*)    HCT 35.1 (*)    RDW 16.2 (*)    Neutro Abs 7.7 (*)    Lymphs Abs 0.7 (*)    All other components within normal limits  PHOSPHORUS  MAGNESIUM   ____________________________________________   EKG    ____________________________________________    RADIOLOGY  X-Paluch pelvis unremarkable X-Stoltz left femur shows a proximal sclerotic lesion but otherwise unremarkable. No fracture Ultrasound left lower extremity reveals occlusive thrombus in the distal portion of the greater saphenous vein in the thigh. These results were discussed with the radiologist.  ____________________________________________   PROCEDURES Procedures  ____________________________________________   INITIAL IMPRESSION / ASSESSMENT AND PLAN / ED COURSE  Pertinent labs & imaging results that were available during my care of the patient were reviewed by me and considered in my medical decision making  (see chart for details).  Patient presents with left thigh pain, workup positive for superficial thrombophlebitis of the greater saphenous vein in the thigh. Thrombus is  far from the saphenofemoral junction and not at risk of significant extension or embolism at this time. We'll treat with NSAIDs he therapy, pain control, light activity, follow up with primary care in 1 week for repeat evaluation and repeat ultrasound as needed. Return precautions given for any signs or symptoms of cellulitis PE or worsening condition.     Clinical Course    ____________________________________________   FINAL CLINICAL IMPRESSION(S) / ED DIAGNOSES  Final diagnoses:  Left leg pain  Thrombophlebitis of saphenous vein, left      New Prescriptions   HYDROMORPHONE (DILAUDID) 2 MG TABLET    Take 1 tablet (2 mg total) by mouth every 6 (six) hours as needed for severe pain.   NAPROXEN (NAPROSYN) 500 MG TABLET    Take 1 tablet (500 mg total) by mouth 2 (two) times daily with a meal.   ONDANSETRON (ZOFRAN ODT) 8 MG DISINTEGRATING TABLET    Take 1 tablet (8 mg total) by mouth every 8 (eight) hours as needed for nausea or vomiting.     Portions of this note were generated with dragon dictation software. Dictation errors may occur despite best attempts at proofreading.    Carrie Mew, MD 05/14/16 2033

## 2016-05-14 NOTE — ED Notes (Signed)
[place] tech reports pt refused test at this time, would prefer to wait until he gets to a room and has pain meds

## 2016-05-14 NOTE — ED Notes (Signed)
AAOx3.  Skin warm and dry. NAD.  Patient states that "it may take me a while to put my clothes on due to the pain."  Patient declined assistance to dress when offered.  Patient skin warm and dry. Posture relaxed. Resting.

## 2016-05-14 NOTE — ED Notes (Signed)
AAOx3.  Skin warm and dry.  NAD 

## 2016-05-21 ENCOUNTER — Inpatient Hospital Stay: Payer: 59

## 2016-05-21 ENCOUNTER — Ambulatory Visit: Payer: 59

## 2016-05-21 ENCOUNTER — Ambulatory Visit: Payer: 59 | Admitting: Oncology

## 2016-05-21 ENCOUNTER — Inpatient Hospital Stay: Payer: 59 | Admitting: Internal Medicine

## 2016-05-28 ENCOUNTER — Inpatient Hospital Stay: Payer: 59 | Attending: Oncology | Admitting: Internal Medicine

## 2016-05-28 ENCOUNTER — Inpatient Hospital Stay: Payer: 59

## 2016-05-28 DIAGNOSIS — I252 Old myocardial infarction: Secondary | ICD-10-CM | POA: Insufficient documentation

## 2016-05-28 DIAGNOSIS — R5383 Other fatigue: Secondary | ICD-10-CM | POA: Insufficient documentation

## 2016-05-28 DIAGNOSIS — C61 Malignant neoplasm of prostate: Secondary | ICD-10-CM

## 2016-05-28 DIAGNOSIS — K529 Noninfective gastroenteritis and colitis, unspecified: Secondary | ICD-10-CM | POA: Diagnosis not present

## 2016-05-28 DIAGNOSIS — Z923 Personal history of irradiation: Secondary | ICD-10-CM | POA: Diagnosis not present

## 2016-05-28 DIAGNOSIS — N183 Chronic kidney disease, stage 3 (moderate): Secondary | ICD-10-CM

## 2016-05-28 DIAGNOSIS — C7951 Secondary malignant neoplasm of bone: Secondary | ICD-10-CM | POA: Insufficient documentation

## 2016-05-28 DIAGNOSIS — F17210 Nicotine dependence, cigarettes, uncomplicated: Secondary | ICD-10-CM | POA: Diagnosis not present

## 2016-05-28 DIAGNOSIS — E1165 Type 2 diabetes mellitus with hyperglycemia: Secondary | ICD-10-CM

## 2016-05-28 DIAGNOSIS — Z7984 Long term (current) use of oral hypoglycemic drugs: Secondary | ICD-10-CM | POA: Diagnosis not present

## 2016-05-28 DIAGNOSIS — E669 Obesity, unspecified: Secondary | ICD-10-CM | POA: Diagnosis not present

## 2016-05-28 DIAGNOSIS — F419 Anxiety disorder, unspecified: Secondary | ICD-10-CM | POA: Diagnosis not present

## 2016-05-28 DIAGNOSIS — Z79899 Other long term (current) drug therapy: Secondary | ICD-10-CM | POA: Diagnosis not present

## 2016-05-28 DIAGNOSIS — C771 Secondary and unspecified malignant neoplasm of intrathoracic lymph nodes: Secondary | ICD-10-CM

## 2016-05-28 DIAGNOSIS — E785 Hyperlipidemia, unspecified: Secondary | ICD-10-CM | POA: Diagnosis not present

## 2016-05-28 DIAGNOSIS — F329 Major depressive disorder, single episode, unspecified: Secondary | ICD-10-CM

## 2016-05-28 DIAGNOSIS — E871 Hypo-osmolality and hyponatremia: Secondary | ICD-10-CM | POA: Diagnosis not present

## 2016-05-28 LAB — COMPREHENSIVE METABOLIC PANEL
ALT: 16 U/L — ABNORMAL LOW (ref 17–63)
ANION GAP: 9 (ref 5–15)
AST: 18 U/L (ref 15–41)
Albumin: 3.9 g/dL (ref 3.5–5.0)
Alkaline Phosphatase: 92 U/L (ref 38–126)
BUN: 19 mg/dL (ref 6–20)
CHLORIDE: 102 mmol/L (ref 101–111)
CO2: 21 mmol/L — AB (ref 22–32)
Calcium: 10.2 mg/dL (ref 8.9–10.3)
Creatinine, Ser: 1.11 mg/dL (ref 0.61–1.24)
Glucose, Bld: 192 mg/dL — ABNORMAL HIGH (ref 65–99)
POTASSIUM: 4.8 mmol/L (ref 3.5–5.1)
SODIUM: 132 mmol/L — AB (ref 135–145)
Total Bilirubin: 0.4 mg/dL (ref 0.3–1.2)
Total Protein: 7.2 g/dL (ref 6.5–8.1)

## 2016-05-28 LAB — CBC WITH DIFFERENTIAL/PLATELET
Basophils Absolute: 0.1 10*3/uL (ref 0–0.1)
Basophils Relative: 1 %
EOS ABS: 0.5 10*3/uL (ref 0–0.7)
Eosinophils Relative: 8 %
HCT: 36.4 % — ABNORMAL LOW (ref 40.0–52.0)
Hemoglobin: 12.3 g/dL — ABNORMAL LOW (ref 13.0–18.0)
LYMPHS ABS: 0.9 10*3/uL — AB (ref 1.0–3.6)
LYMPHS PCT: 12 %
MCH: 31.5 pg (ref 26.0–34.0)
MCHC: 33.7 g/dL (ref 32.0–36.0)
MCV: 93.5 fL (ref 80.0–100.0)
MONO ABS: 0.7 10*3/uL (ref 0.2–1.0)
Monocytes Relative: 9 %
Neutro Abs: 5 10*3/uL (ref 1.4–6.5)
Neutrophils Relative %: 70 %
Platelets: 334 10*3/uL (ref 150–440)
RBC: 3.89 MIL/uL — AB (ref 4.40–5.90)
RDW: 16.1 % — AB (ref 11.5–14.5)
WBC: 7.2 10*3/uL (ref 3.8–10.6)

## 2016-05-28 LAB — PSA: PSA: 262 ng/mL — AB (ref 0.00–4.00)

## 2016-05-28 NOTE — Assessment & Plan Note (Addendum)
#   METASTATIC CASTRATE RESISTANT PROSTATE CANCER- Completed Taxotere palliative chemotherapy on 04/01/16. Lupron last on 03/22/16.  # Hyponatremia- Labs today.   # Superficial thrombus in left thigh- Continue with pain management as indicated by hospital. Encouraged mobility.   #Fatigue- Encouraged activity.   # Chronic diarrhea-Stable. Continue lomotil if needed.   #Next Lupron is due in March (q 4 months). Will give Lupron and X-geva together in March 2018.  # Plan for Repeat CT abdomen/pelvis and NM bone scan in next month. Last scan was in May 2017. See MD/Labs a few days later for results.

## 2016-05-28 NOTE — Progress Notes (Signed)
[place] OFFICE PROGRESS NOTE  Patient Care Team: Tracie Harrier, MD as PCP - General (Internal Medicine)   SUMMARY OF ONCOLOGIC HISTORY:  Oncology History   # 2014- PROSTATE CA [s/p Bx-Gleason score 5+4; s/p EBRT [Dr.Crystal];July 2015- Biochem Relapse [PSA-15]; non-compliance with Lupron; JAN 2016-PSA 50   # MAY 2016- PSA-100/Bone scan- Bone mets [s/p EBRT Right Hip]; CT A/P- PELVIC/RP LN;   # July 2016- ? 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 He was scheduled to return to clinic 6 weeks later for [facility] and labs but missed his appointment because he was admitted to the hospital for a superficial blood clot to left thigh. He states he thinks he developed the blood clot because he has been inactive over the past year due to the fatigue. He was treated with pain medication and sent home. He feels much better. He has not had labs drawn today because he missed his last appointment.   Patient continues to complain of chronic fatigue. He states that he diarrhea has much improved. No weight loss. Denies any tingling and numbness in his legs. No new swelling in the legs. No shortness of breath.  REVIEW OF SYSTEMS:  A complete 10 point review of system is done which is  negative except mentioned above/history of present illness.   PAST MEDICAL HISTORY :  Past Medical History:  Diagnosis Date  . Anxiety   . Cancer [facility])    states he had cancer in his left forearm but is unaware of type and was told his body took care of it  . Chronic kidney disease    stage 3  . Depression   . Essential hypertension, benign   . Family history of adverse reaction to anesthesia    adopted  . Myocardial infarction   . Obesity   . Other and unspecified hyperlipidemia   . PONV (postoperative nausea and vomiting)    nausea  . Prostate cancer ([place])   . Type II or unspecified type diabetes mellitus without mention of complication, uncontrolled   . Varicosities     PAST SURGICAL HISTORY :   Past Surgical History:  Procedure Laterality Date  . CARDIAC CATHETERIZATION    . COLONOSCOPY  2013  . COLONOSCOPY WITH PROPOFOL N/A 01/23/2015   Procedure: COLONOSCOPY WITH PROPOFOL;  Surgeon: Hulen Luster, MD;  Location: [facility] ENDOSCOPY;  Service: Gastroenterology;  Laterality: N/A;  . INTRAOPERATIVE ARTERIOGRAM     for vascular disease  . TONSILLECTOMY    . TOTAL HIP ARTHROPLASTY Right 05/01/2015   Procedure: TOTAL HIP ARTHROPLASTY ANTERIOR APPROACH;  Surgeon: Hessie Knows, MD;  Location: [facility] ORS;  Service: Orthopedics;  Laterality: Right;    FAMILY HISTORY :   Family History  Problem Relation Age of Onset  .  Adopted: Yes    SOCIAL HISTORY:   Social History  Substance Use Topics  . Smoking status: Current Every Day Smoker    Packs/day: 0.50    Types: Cigarettes  . Smokeless tobacco: Never Used  . Alcohol use 0.0 oz/week     Comment: 28 12 ounce beers per week    ALLERGIES:  is allergic to codeine sulfate and prednisone.  MEDICATIONS:  Current Outpatient Prescriptions  Medication Sig Dispense Refill  . ALPRAZolam (XANAX) 0.5 MG tablet Take 0.5 mg by mouth 2 (two) times daily as needed for sleep.     Marland Kitchen amLODipine (NORVASC) 10 MG tablet Take 10 mg by mouth daily.     Marland Kitchen atenolol (TENORMIN) 100 MG tablet Take 100 mg by mouth daily.    . Cholecalciferol (VITAMIN D) 2000 units tablet Take 2,000 Units by mouth daily.     . Cyanocobalamin (RA VITAMIN B-12 TR) 1000 MCG TBCR Take 1 tablet by mouth daily.     Marland Kitchen glimepiride (AMARYL) 4 MG tablet Take 1 tablet (4 mg total) by mouth daily with breakfast. 30 tablet 3  . isosorbide mononitrate (IMDUR) 30 MG 24 hr tablet Take 30 mg by mouth daily.    Marland Kitchen lisinopril (PRINIVIL,ZESTRIL) 20 MG tablet Take 20 mg by mouth daily.     . metFORMIN (GLUCOPHAGE) 1000 MG tablet Take 1,000 mg by mouth daily with breakfast.     . Multiple Vitamins-Minerals (MEGA MULTI MEN PO) Take 1 tablet by mouth daily.     . simvastatin (ZOCOR) 40 MG tablet Take 40 mg by mouth every evening.    . tamsulosin (FLOMAX) 0.4 MG CAPS capsule Take 1 capsule (0.4 mg total) by mouth daily after supper. 30 capsule 6  . dexamethasone (DECADRON) 4 MG tablet Take 4 mg by mouth 2 (two) times daily with a meal. Take 1 tablet (4 mg total) by mouth 2 (two) times daily. Start the day before Taxotere. Then daily after chemo for 2 days. - Oral    . naproxen (NAPROSYN) 500 MG tablet Take 1 tablet (500 mg total) by mouth 2 (two) times daily with a meal. (Patient not taking: Reported on 05/28/2016) 20 tablet 0   No current facility-administered medications for this visit.     PHYSICAL EXAMINATION: ECOG PERFORMANCE STATUS: 2 - Symptomatic, <50% confined to bed  BP (!) 149/81 (BP Location: Right Arm, Patient Position: Sitting)   Pulse (!) 57   Temp 97.6 F (36.4 C) (Tympanic)   Wt 210 lb 8 oz (95.5 kg)   BMI 32.01 kg/m   Filed Weights   05/28/16 1040  Weight: 210 lb 8 oz (95.5 kg)    GENERAL: Well-nourished well-developed; Alert, no distress and comfortable.  He can sit on exam table by himself.  Obese.  He is Alone. EYES: no pallor or icterus OROPHARYNX: no thrush or ulceration; good dentition  NECK: supple, no masses felt LYMPH:  no palpable lymphadenopathy in  the cervical, axillary or inguinal regions LUNGS: clear to auscultation and  No wheeze or crackles HEART/CVS: regular rate & rhythm and no murmurs; No lower extremity edema ABDOMEN:abdomen soft, non-tender and normal bowel sounds Musculoskeletal:no cyanosis of digits and no clubbing  PSYCH: alert & oriented x 3 with fluent speech NEURO: no focal motor/sensory deficits SKIN:  no rashes or significant lesions  LABORATORY DATA:  I have reviewed the data as listed    Component Value Date/Time   NA 132 (L) 05/28/2016 1120   K 4.8 05/28/2016 1120  CL 102 05/28/2016 1120   CO2 21 (L) 05/28/2016 1120   GLUCOSE 192 (H) 05/28/2016 1120   BUN 19 05/28/2016 1120   CREATININE 1.11 05/28/2016 1120   CALCIUM 10.2 05/28/2016 1120   PROT 7.2 05/28/2016 1120   ALBUMIN 3.9 05/28/2016 1120   AST 18 05/28/2016 1120   ALT 16 (L) 05/28/2016 1120   ALKPHOS 92 05/28/2016 1120   BILITOT 0.4 05/28/2016 1120   GFRNONAA >60 05/28/2016 1120   GFRAA >60 05/28/2016 1120    No results found for: SPEP, UPEP  Lab Results  Component Value Date   WBC 7.2 05/28/2016   NEUTROABS 5.0 05/28/2016   HGB 12.3 (L) 05/28/2016   HCT 36.4 (L) 05/28/2016   MCV 93.5 05/28/2016   PLT 334 05/28/2016      Chemistry      Component Value Date/Time   NA 132 (L) 05/28/2016 1120   K 4.8 05/28/2016 1120   CL 102 05/28/2016 1120   CO2 21 (L) 05/28/2016 1120   BUN 19 05/28/2016 1120   CREATININE 1.11 05/28/2016 1120      Component Value Date/Time   CALCIUM 10.2 05/28/2016 1120   ALKPHOS 92 05/28/2016 1120   AST 18 05/28/2016 1120   ALT 16 (L) 05/28/2016 1120   BILITOT 0.4 05/28/2016 1120       Results for DAANISH, SPACK (MRN [id number]) as of 03/11/2016 09:20  Ref. Range 03/07/2015 13:58 08/22/2015 13:10 10/24/2015 13:00 12/02/2015 14:40 03/04/2016 09:00  PSA Latest Ref Range: 0.00 - 4.00 ng/mL 1.01 7.07 (H) 24.20 (H) 43.69 (H) 68.03 (H)   RADIOGRAPHIC STUDIES:   ASSESSMENT & PLAN:   Malignant neoplasm of  prostate ([place]) # METASTATIC CASTRATE RESISTANT PROSTATE CANCER- Completed Taxotere palliative chemotherapy on 04/01/16. Lupron last on 03/22/16.  # Hyponatremia- Labs today.   # Superficial thrombus in left thigh- Continue with pain management as indicated by hospital. Encouraged mobility.   #Fatigue- Encouraged activity.   # Chronic diarrhea-Stable. Continue lomotil if needed.   #Next Lupron is due in March (q 4 months). Will give Lupron and X-geva together in March 2018.  # Plan for Repeat CT abdomen/pelvis and NM bone scan in next month. Last scan was in May 2017. See MD/Labs a few days later for results.   Faythe Casa, NP   Jacquelin Hawking, NP 05/28/2016 1:43 PM

## 2016-05-28 NOTE — Progress Notes (Signed)
Patient here today for follow up.   

## 2016-06-18 ENCOUNTER — Other Ambulatory Visit: Payer: Self-pay

## 2016-06-18 ENCOUNTER — Inpatient Hospital Stay: Payer: BLUE CROSS/BLUE SHIELD | Attending: Internal Medicine

## 2016-06-18 DIAGNOSIS — Z79899 Other long term (current) drug therapy: Secondary | ICD-10-CM | POA: Insufficient documentation

## 2016-06-18 DIAGNOSIS — F17210 Nicotine dependence, cigarettes, uncomplicated: Secondary | ICD-10-CM | POA: Insufficient documentation

## 2016-06-18 DIAGNOSIS — F419 Anxiety disorder, unspecified: Secondary | ICD-10-CM | POA: Diagnosis not present

## 2016-06-18 DIAGNOSIS — M25552 Pain in left hip: Secondary | ICD-10-CM | POA: Diagnosis not present

## 2016-06-18 DIAGNOSIS — N183 Chronic kidney disease, stage 3 (moderate): Secondary | ICD-10-CM | POA: Insufficient documentation

## 2016-06-18 DIAGNOSIS — F329 Major depressive disorder, single episode, unspecified: Secondary | ICD-10-CM | POA: Diagnosis not present

## 2016-06-18 DIAGNOSIS — E785 Hyperlipidemia, unspecified: Secondary | ICD-10-CM | POA: Insufficient documentation

## 2016-06-18 DIAGNOSIS — E669 Obesity, unspecified: Secondary | ICD-10-CM | POA: Diagnosis not present

## 2016-06-18 DIAGNOSIS — I129 Hypertensive chronic kidney disease with stage 1 through stage 4 chronic kidney disease, or unspecified chronic kidney disease: Secondary | ICD-10-CM | POA: Insufficient documentation

## 2016-06-18 DIAGNOSIS — C61 Malignant neoplasm of prostate: Secondary | ICD-10-CM | POA: Diagnosis present

## 2016-06-18 DIAGNOSIS — Z7984 Long term (current) use of oral hypoglycemic drugs: Secondary | ICD-10-CM | POA: Diagnosis not present

## 2016-06-18 DIAGNOSIS — E119 Type 2 diabetes mellitus without complications: Secondary | ICD-10-CM | POA: Insufficient documentation

## 2016-06-18 DIAGNOSIS — I252 Old myocardial infarction: Secondary | ICD-10-CM | POA: Insufficient documentation

## 2016-06-18 DIAGNOSIS — M25511 Pain in right shoulder: Secondary | ICD-10-CM | POA: Insufficient documentation

## 2016-06-18 DIAGNOSIS — G893 Neoplasm related pain (acute) (chronic): Secondary | ICD-10-CM | POA: Diagnosis not present

## 2016-06-18 DIAGNOSIS — Z9221 Personal history of antineoplastic chemotherapy: Secondary | ICD-10-CM | POA: Insufficient documentation

## 2016-06-18 DIAGNOSIS — R197 Diarrhea, unspecified: Secondary | ICD-10-CM | POA: Insufficient documentation

## 2016-06-18 DIAGNOSIS — C7951 Secondary malignant neoplasm of bone: Secondary | ICD-10-CM | POA: Diagnosis not present

## 2016-06-18 LAB — COMPREHENSIVE METABOLIC PANEL
ALT: 16 U/L — ABNORMAL LOW (ref 17–63)
ANION GAP: 9 (ref 5–15)
AST: 19 U/L (ref 15–41)
Albumin: 3.4 g/dL — ABNORMAL LOW (ref 3.5–5.0)
Alkaline Phosphatase: 93 U/L (ref 38–126)
BILIRUBIN TOTAL: 0.3 mg/dL (ref 0.3–1.2)
BUN: 17 mg/dL (ref 6–20)
CALCIUM: 9 mg/dL (ref 8.9–10.3)
CO2: 23 mmol/L (ref 22–32)
Chloride: 100 mmol/L — ABNORMAL LOW (ref 101–111)
Creatinine, Ser: 1.16 mg/dL (ref 0.61–1.24)
Glucose, Bld: 219 mg/dL — ABNORMAL HIGH (ref 65–99)
Potassium: 4.6 mmol/L (ref 3.5–5.1)
Sodium: 132 mmol/L — ABNORMAL LOW (ref 135–145)
TOTAL PROTEIN: 6.8 g/dL (ref 6.5–8.1)

## 2016-06-18 LAB — CBC WITH DIFFERENTIAL/PLATELET
Basophils Absolute: 0.1 10*3/uL (ref 0–0.1)
Basophils Relative: 1 %
EOS ABS: 0.6 10*3/uL (ref 0–0.7)
Eosinophils Relative: 8 %
HCT: 35.2 % — ABNORMAL LOW (ref 40.0–52.0)
Hemoglobin: 11.9 g/dL — ABNORMAL LOW (ref 13.0–18.0)
LYMPHS ABS: 1.1 10*3/uL (ref 1.0–3.6)
Lymphocytes Relative: 14 %
MCH: 30.5 pg (ref 26.0–34.0)
MCHC: 33.9 g/dL (ref 32.0–36.0)
MCV: 90.1 fL (ref 80.0–100.0)
MONOS PCT: 8 %
Monocytes Absolute: 0.7 10*3/uL (ref 0.2–1.0)
Neutro Abs: 5.7 10*3/uL (ref 1.4–6.5)
Neutrophils Relative %: 69 %
Platelets: 354 10*3/uL (ref 150–440)
RBC: 3.9 MIL/uL — ABNORMAL LOW (ref 4.40–5.90)
RDW: 15.5 % — ABNORMAL HIGH (ref 11.5–14.5)
WBC: 8.2 10*3/uL (ref 3.8–10.6)

## 2016-06-18 LAB — PSA: PSA: 355 ng/mL — AB (ref 0.00–4.00)

## 2016-06-21 ENCOUNTER — Ambulatory Visit: Admission: RE | Admit: 2016-06-21 | Payer: BLUE CROSS/BLUE SHIELD | Source: Ambulatory Visit

## 2016-06-21 ENCOUNTER — Ambulatory Visit
Admission: RE | Admit: 2016-06-21 | Discharge: 2016-06-21 | Disposition: A | Discharge status: Home / Self Care | Payer: BLUE CROSS/BLUE SHIELD | Source: Ambulatory Visit | Attending: Oncology | Admitting: Oncology

## 2016-06-21 ENCOUNTER — Encounter
Admission: RE | Admit: 2016-06-21 | Discharge: 2016-06-21 | Disposition: A | Discharge status: Home / Self Care | Payer: BLUE CROSS/BLUE SHIELD | Source: Ambulatory Visit | Attending: Oncology | Admitting: Oncology

## 2016-06-21 DIAGNOSIS — C61 Malignant neoplasm of prostate: Secondary | ICD-10-CM

## 2016-06-21 DIAGNOSIS — C7951 Secondary malignant neoplasm of bone: Secondary | ICD-10-CM | POA: Diagnosis not present

## 2016-06-21 DIAGNOSIS — I700 Atherosclerosis of aorta: Secondary | ICD-10-CM | POA: Insufficient documentation

## 2016-06-21 DIAGNOSIS — R932 Abnormal findings on diagnostic imaging of liver and biliary tract: Secondary | ICD-10-CM | POA: Diagnosis not present

## 2016-06-21 DIAGNOSIS — N289 Disorder of kidney and ureter, unspecified: Secondary | ICD-10-CM | POA: Diagnosis not present

## 2016-06-21 MED ORDER — TECHNETIUM TC 99M MEDRONATE IV KIT
25.0000 | PACK | Freq: Once | INTRAVENOUS | Status: AC | PRN
  Administered 2016-06-21: 20.64 via INTRAVENOUS

## 2016-06-21 MED ORDER — IOPAMIDOL (ISOVUE-300) INJECTION 61%
100.0000 mL | Freq: Once | INTRAVENOUS | Status: AC | PRN
  Administered 2016-06-21: 100 mL via INTRAVENOUS

## 2016-06-25 ENCOUNTER — Other Ambulatory Visit: Payer: Self-pay | Admitting: *Deleted

## 2016-06-25 ENCOUNTER — Inpatient Hospital Stay (HOSPITAL_BASED_OUTPATIENT_CLINIC_OR_DEPARTMENT_OTHER): Payer: BLUE CROSS/BLUE SHIELD | Admitting: Internal Medicine

## 2016-06-25 VITALS — BP 163/83 | HR 64 | Temp 97.2°F | Wt 207.5 lb

## 2016-06-25 DIAGNOSIS — Z7984 Long term (current) use of oral hypoglycemic drugs: Secondary | ICD-10-CM

## 2016-06-25 DIAGNOSIS — M25511 Pain in right shoulder: Secondary | ICD-10-CM

## 2016-06-25 DIAGNOSIS — I129 Hypertensive chronic kidney disease with stage 1 through stage 4 chronic kidney disease, or unspecified chronic kidney disease: Secondary | ICD-10-CM

## 2016-06-25 DIAGNOSIS — E119 Type 2 diabetes mellitus without complications: Secondary | ICD-10-CM

## 2016-06-25 DIAGNOSIS — C61 Malignant neoplasm of prostate: Secondary | ICD-10-CM | POA: Diagnosis not present

## 2016-06-25 DIAGNOSIS — M25552 Pain in left hip: Secondary | ICD-10-CM

## 2016-06-25 DIAGNOSIS — F419 Anxiety disorder, unspecified: Secondary | ICD-10-CM

## 2016-06-25 DIAGNOSIS — C771 Secondary and unspecified malignant neoplasm of intrathoracic lymph nodes: Principal | ICD-10-CM

## 2016-06-25 DIAGNOSIS — R197 Diarrhea, unspecified: Secondary | ICD-10-CM | POA: Diagnosis not present

## 2016-06-25 DIAGNOSIS — C7951 Secondary malignant neoplasm of bone: Secondary | ICD-10-CM

## 2016-06-25 DIAGNOSIS — I252 Old myocardial infarction: Secondary | ICD-10-CM

## 2016-06-25 DIAGNOSIS — F329 Major depressive disorder, single episode, unspecified: Secondary | ICD-10-CM

## 2016-06-25 DIAGNOSIS — E785 Hyperlipidemia, unspecified: Secondary | ICD-10-CM

## 2016-06-25 DIAGNOSIS — N183 Chronic kidney disease, stage 3 (moderate): Secondary | ICD-10-CM

## 2016-06-25 DIAGNOSIS — G893 Neoplasm related pain (acute) (chronic): Secondary | ICD-10-CM

## 2016-06-25 DIAGNOSIS — F17210 Nicotine dependence, cigarettes, uncomplicated: Secondary | ICD-10-CM

## 2016-06-25 DIAGNOSIS — E669 Obesity, unspecified: Secondary | ICD-10-CM

## 2016-06-25 DIAGNOSIS — Z79899 Other long term (current) drug therapy: Secondary | ICD-10-CM

## 2016-06-25 DIAGNOSIS — Z9221 Personal history of antineoplastic chemotherapy: Secondary | ICD-10-CM

## 2016-06-25 MED ORDER — ENZALUTAMIDE 40 MG PO CAPS: 160.0000 mg | ORAL_CAPSULE | Freq: Every day | ORAL | 4 refills | Status: DC

## 2016-06-25 MED ORDER — MORPHINE SULFATE 15 MG PO TABS: 15.0000 mg | ORAL_TABLET | Freq: Three times a day (TID) | ORAL | 0 refills | Status: DC | PRN

## 2016-06-25 MED ORDER — FENTANYL 25 MCG/HR TD PT72: 25.0000 ug | MEDICATED_PATCH | TRANSDERMAL | 0 refills | Status: DC

## 2016-06-25 NOTE — Progress Notes (Signed)
[place] OFFICE PROGRESS NOTE  Patient Care Team: Tracie Harrier, MD as PCP - General (Internal Medicine)   SUMMARY OF ONCOLOGIC HISTORY:  Oncology History   # 2014- PROSTATE CA [s/p Bx-Gleason score 5+4; s/p EBRT [Dr.Crystal];July 2015- Biochem Relapse [PSA-15]; non-compliance with Lupron; JAN 2016-PSA 50   # MAY 2016- PSA-100/Bone scan- Bone mets [s/p EBRT Right Hip]; CT A/P- PELVIC/RP LN;   # July 2016- ? CASTRATE RESISTANT START ZYTIGA + Prednisone; AUG PSA 3.2; OCT 2016- CT- Improved Pelvic/RP LN ~10-17MM; Multiple bone lesions;OCT 2016-Bone scan- Neg for progressive mets.OCT 2016- 1.01   # AUG 2017- PSA 43/bone scan- worse/CT- stable/improved- START Taxotere q 3W x 6 cycles. 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  Patient states his diarrhea significantly improved; since he stopped the steroids. Patient continues to complain of chronic fatigue.  No weight loss. Denies any tingling and numbness in his legs. No new swelling in the legs. No shortness of breath.  He started to complain of pain in his left hip; and also pain in his right shoulder. He is not on any regular pain medication. He does complain of intense pain especially with exertion. 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   REVIEW OF SYSTEMS:  A complete 10 point review of system is done which is negative except  mentioned above/history of present illness.   PAST MEDICAL HISTORY :  Past Medical History:  Diagnosis Date  . Anxiety   . Cancer [facility])    states he had cancer in his left forearm but is unaware of type and was told his body took care of it  . Chronic kidney disease    stage 3  . Depression   . Essential hypertension, benign   . Family history of adverse reaction to anesthesia    adopted  . Myocardial infarction   . Obesity   . Other and unspecified hyperlipidemia   . PONV (postoperative nausea and vomiting)    nausea  . Prostate cancer ([place])   . Type II or unspecified type diabetes mellitus without mention of complication, uncontrolled    Metformin  . Varicosities     PAST SURGICAL HISTORY :   Past Surgical History:  Procedure Laterality Date  . CARDIAC CATHETERIZATION    . COLONOSCOPY  2013  . COLONOSCOPY WITH PROPOFOL N/A 01/23/2015   Procedure: COLONOSCOPY WITH PROPOFOL;  Surgeon: Hulen Luster, MD;  Location: [facility] ENDOSCOPY;  Service: Gastroenterology;  Laterality: N/A;  . INTRAOPERATIVE ARTERIOGRAM     for vascular disease  . TONSILLECTOMY    . TOTAL HIP ARTHROPLASTY Right 05/01/2015   Procedure: TOTAL HIP ARTHROPLASTY ANTERIOR APPROACH;  Surgeon: Hessie Knows, MD;  Location: [facility] ORS;  Service: Orthopedics;  Laterality: Right;    FAMILY HISTORY :   Family History  Problem Relation Age of Onset  . Adopted: Yes  SOCIAL HISTORY:   Social History  Substance Use Topics  . Smoking status: Current Every Day Smoker    Packs/day: 0.50    Types: Cigarettes  . Smokeless tobacco: Never Used  . Alcohol use 0.0 oz/week     Comment: 28 12 ounce beers per week    ALLERGIES:  is allergic to codeine sulfate and prednisone.  MEDICATIONS:  Current Outpatient Prescriptions  Medication Sig Dispense Refill  . ALPRAZolam (XANAX) 0.5 MG tablet Take 0.5 mg by mouth 2 (two) times daily as needed for sleep.     Marland Kitchen amLODipine (NORVASC) 10 MG tablet Take 10 mg by mouth daily.    Marland Kitchen  atenolol (TENORMIN) 100 MG tablet Take 100 mg by mouth daily.    . Cholecalciferol (VITAMIN D) 2000 units tablet Take 2,000 Units by mouth daily.     . Cyanocobalamin (RA VITAMIN B-12 TR) 1000 MCG TBCR Take 1 tablet by mouth daily.     Marland Kitchen glimepiride (AMARYL) 4 MG tablet Take 1 tablet (4 mg total) by mouth daily with breakfast. 30 tablet 3  . isosorbide mononitrate (IMDUR) 30 MG 24 hr tablet Take 30 mg by mouth daily.    Marland Kitchen lisinopril (PRINIVIL,ZESTRIL) 20 MG tablet Take 20 mg by mouth daily.     . metFORMIN (GLUCOPHAGE) 1000 MG tablet Take 1,000 mg by mouth daily with breakfast.     . Multiple Vitamins-Minerals (MEGA MULTI MEN PO) Take 1 tablet by mouth daily.     . simvastatin (ZOCOR) 40 MG tablet Take 40 mg by mouth every evening.    . tamsulosin (FLOMAX) 0.4 MG CAPS capsule Take 1 capsule (0.4 mg total) by mouth daily after supper. 30 capsule 6  . enzalutamide (XTANDI) 40 MG capsule Take 4 capsules (160 mg total) by mouth daily. 120 capsule 4  . fentaNYL (DURAGESIC - DOSED MCG/HR) 25 MCG/HR patch Place 1 patch (25 mcg total) onto the skin every 3 (three) days. 10 patch 0  . morphine (MSIR) 15 MG tablet Take 1 tablet (15 mg total) by mouth every 8 (eight) hours as needed for severe pain. 90 tablet 0  . naproxen (NAPROSYN) 500 MG tablet Take 1 tablet (500 mg total) by mouth 2 (two) times daily with a meal. (Patient not taking: Reported on 06/25/2016) 20 tablet 0   No current facility-administered medications for this visit.     PHYSICAL EXAMINATION: ECOG PERFORMANCE STATUS: 2 - Symptomatic, <50% confined to bed  BP (!) 163/83 (BP Location: Right Arm, Patient Position: Sitting)   Pulse 64   Temp 97.2 F (36.2 C) (Tympanic)   Wt 207 lb 8 oz (94.1 kg)   BMI 31.55 kg/m   Filed Weights   06/25/16 1049  Weight: 207 lb 8 oz (94.1 kg)    GENERAL: Well-nourished well-developed; Alert, no distress and comfortable.  He can sit on exam table by himself.  Obese.  He is Alone. EYES: no pallor or  icterus OROPHARYNX: no thrush or ulceration; good dentition  NECK: supple, no masses felt LYMPH:  no palpable lymphadenopathy in the cervical, axillary or inguinal regions LUNGS: clear to auscultation and  No wheeze or crackles HEART/CVS: regular rate & rhythm and no murmurs; No lower extremity edema ABDOMEN:abdomen soft, non-tender and normal bowel sounds Musculoskeletal:no cyanosis of digits and no clubbing  PSYCH: alert & oriented x 3 with fluent speech NEURO: no focal motor/sensory deficits SKIN:  no rashes or significant lesions  LABORATORY DATA:  I have reviewed the data as listed  Component Value Date/Time   NA 132 (L) 06/18/2016 1025   K 4.6 06/18/2016 1025   CL 100 (L) 06/18/2016 1025   CO2 23 06/18/2016 1025   GLUCOSE 219 (H) 06/18/2016 1025   BUN 17 06/18/2016 1025   CREATININE 1.16 06/18/2016 1025   CALCIUM 9.0 06/18/2016 1025   PROT 6.8 06/18/2016 1025   ALBUMIN 3.4 (L) 06/18/2016 1025   AST 19 06/18/2016 1025   ALT 16 (L) 06/18/2016 1025   ALKPHOS 93 06/18/2016 1025   BILITOT 0.3 06/18/2016 1025   GFRNONAA >60 06/18/2016 1025   GFRAA >60 06/18/2016 1025    No results found for: SPEP, UPEP  Lab Results  Component Value Date   WBC 8.2 06/18/2016   NEUTROABS 5.7 06/18/2016   HGB 11.9 (L) 06/18/2016   HCT 35.2 (L) 06/18/2016   MCV 90.1 06/18/2016   PLT 354 06/18/2016      Chemistry      Component Value Date/Time   NA 132 (L) 06/18/2016 1025   K 4.6 06/18/2016 1025   CL 100 (L) 06/18/2016 1025   CO2 23 06/18/2016 1025   BUN 17 06/18/2016 1025   CREATININE 1.16 06/18/2016 1025      Component Value Date/Time   CALCIUM 9.0 06/18/2016 1025   ALKPHOS 93 06/18/2016 1025   AST 19 06/18/2016 1025   ALT 16 (L) 06/18/2016 1025   BILITOT 0.3 06/18/2016 1025     Results for SHANAN, BENNINGTON (MRN [id number]) as of 06/25/2016 10:29  Ref. Range 10/24/2015 13:00 12/02/2015 14:40 03/04/2016 09:00 05/28/2016 11:20 06/18/2016 10:28  PSA Latest Ref Range: 0.00 - 4.00  ng/mL 24.20 (H) 43.69 (H) 68.03 (H) 262.00 (H) 355.00 (H)   IMPRESSION: 1. Multiple areas of relative increased attenuation identified in the right lobe of the liver on the previous study are not evident today. These were felt to be likely related to benign etiology previously and slight difference in bolus timing could account for lack of visualization today. 2. Stable appearance of the index lymph nodes identified previously in the abdomen and pelvis. No new or progressive lymphadenopathy on today's study. 3. Fairly widespread sclerotic bony metastases with apparent slight progression in the bony pelvis with relative stability of lesions identified in the thoracolumbar spine. 4. Interval stability of multiple lesions identified in the inferior kidneys bilaterally. Some of these have attenuation higher than would be expected for a simple cyst and while likely cysts complicated by proteinaceous debris or hemorrhage, attention on follow-up recommended. 5.  Abdominal Aortic Atherosclerois (ICD10-170.0)   Electronically Signed   By: Misty Stanley M.D.   On: 06/21/2016 13:59 ------------------------------------------------------------------------------ IMPRESSION: Progression of diffuse osseous metastatic disease as detailed above.   Electronically Signed   By: Genia Del M.D.   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 Lupron q 75m- last on 03/22/16; again due in march 2018.   # CT February 2018  no liver lesions; but stable pelvic adenopathy. Mild progressive disease in the bone. Bone scan shows progressive disease in the bone. PSA is rising as above.  Discussed re: x-tandi. Discussed the potential side effects of extending including but not limited to seizures/fatigue. New  prescription given.  # refer to Dr.Crystal re: Xofigo; and EBRT to left hip. Also discussed the role of radium in the treatment of bone limited castrate resistant prostate  cancer.  #  Reviewed/counselled regarding the goals of care- being palliative/treatment are usually indefinite-until progression or side effects. Goal is to maintain quality of life as the disease is incurable.  # Chronic diarrhea- resolved; after stopping steroids [as per pt].   # chronic pain- sec to cancer- progressive- recommend fenatnyl 25 mcg; with morphine prn.   # I reviewed the blood work- with the patient in detail; also reviewed the imaging independently [as summarized above]; and with the patient in detail.   #Next Lupron is due in March (q 4 months). Will give Lupron and X-geva together in March 2018/MD visit- 1st week/labs.     Cammie Sickle, MD 06/25/2016 5:14 PM

## 2016-06-25 NOTE — Assessment & Plan Note (Addendum)
#   METASTATIC CASTRATE RESISTANT PROSTATE CANCER- Completed Taxotere palliative chemotherapy on 04/01/16 x6 cycles.. Lupron q 5m- last on 03/22/16; again due in march 2018.   # CT February 2018  no liver lesions; but stable pelvic adenopathy. Mild progressive disease in the bone. Bone scan shows progressive disease in the bone. PSA is rising as above.  Discussed re: x-tandi. Discussed the potential side effects of extending including but not limited to seizures/fatigue. New prescription given.  # refer to Dr.Crystal re: Xofigo; and EBRT to left hip. Also discussed the role of radium in the treatment of bone limited castrate resistant prostate cancer.  #  Reviewed/counselled regarding the goals of care- being palliative/treatment are usually indefinite-until progression or side effects. Goal is to maintain quality of life as the disease is incurable.  # Chronic diarrhea- resolved; after stopping steroids [as per pt].   # chronic pain- sec to cancer- progressive- recommend fenatnyl 25 mcg; with morphine prn.   # I reviewed the blood work- with the patient in detail; also reviewed the imaging independently [as summarized above]; and with the patient in detail.   #Next Lupron is due in March (q 4 months). Will give Lupron and X-geva together in March 2018/MD visit- 1st week/labs.

## 2016-06-25 NOTE — Patient Instructions (Signed)
Enzalutamide capsules What is this medicine? ENZALUTAMIDE blocks the effect of the male hormone called testosterone. This medicine is used for certain types of prostate cancer. COMMON BRAND NAME(S): XTANDI What should I tell my health care provider before I take this medicine? They need to know if you have any of these conditions: -brain tumor -head injury -history of stroke -seizures -an unusual or allergic reaction to enzalutamide, other medicines, foods, dyes, or preservatives -pregnant or trying to get pregnant -breast-feeding How should I use this medicine? Take this medicine by mouth with a glass of water. You can take it with or without food. If it upsets your stomach, take it with food. Follow the directions on the prescription label. Do not cut, crush, or chew this medicine. Take your medicine at regular intervals. Do not take it more often than directed. Do not stop taking except on your doctor's advice. Talk to your pediatrician regarding the use of this medicine in children. Special care may be needed. What if I miss a dose? If you miss a dose, take it as soon as you can. If it is almost time for your next dose, take only that dose and skip your missed dose. Do not take double or extra doses. What may interact with this medicine? This medicine may interact with the following medications: -alfentanil -bosentan -certain medications for seizures like carbamazepine, phenobarbital, and phenytoin -cyclosporine -dihydroergotamine -efavirenz -ergotamine -etravirine -fentanyl -gemfibrozil -midazolam -modafinil -nafcillin -omeprazole -pimozide -quinidine -rifabutin -rifampin -rifapentine -St. John's Wort -sirolimus -tacrolimus -warfarin What should I watch for while using this medicine? This medicine should not be used in women. Men should not father a child while taking this medicine and for 3 months after stopping it. There is a potential for serious side effects to an  unborn child. Talk to your health care professional or pharmacist for more information. Due to a risk of seizures with enzalutamide therapy, use caution when engaging in activities that could result in serious harm to yourself or others if you pass out. What side effects may I notice from receiving this medicine? Side effects that you should report to your doctor or health care professional as soon as possible: -allergic reactions like skin rash, itching or hives, swelling of the face, lips, or tongue -changes in vision -confusion -falls -loss of memory -seizures -sudden numbness or weakness of the face, arm or leg -trouble speaking or understanding -trouble walking, dizziness, loss of balance or coordination Side effects that usually do not require medical attention (report to your doctor or health care professional if they continue or are bothersome): -blood in the urine -breathing problems -diarrhea -dizziness -headache -joint pain -pain, tingling, numbness in the hands or feet -swelling of the ankles, feet, hands -unusually weak or tired Where should I keep my medicine? Keep out of the reach of children. Store between 20 and 25 degrees C (68 and 77 degrees F). Throw away any unused medicine after the expiration date.  2017 [employer]vier/Gold Standard (2015-06-05 12:13:51)

## 2016-06-25 NOTE — Progress Notes (Signed)
Patient here today for follow up.  Patient c/o shoulder pain, today is the right shoulder

## 2016-06-29 ENCOUNTER — Ambulatory Visit: Payer: BLUE CROSS/BLUE SHIELD | Attending: Radiation Oncology | Admitting: Radiation Oncology

## 2016-07-23 ENCOUNTER — Inpatient Hospital Stay: Payer: BLUE CROSS/BLUE SHIELD | Admitting: Internal Medicine

## 2016-07-23 ENCOUNTER — Inpatient Hospital Stay: Payer: BLUE CROSS/BLUE SHIELD

## 2016-07-23 ENCOUNTER — Telehealth: Payer: Self-pay | Admitting: *Deleted

## 2016-07-23 NOTE — Telephone Encounter (Signed)
Patient called cancer center and c/o "I don't want to come today for my appointment. I am not feeling well today and I would like to reschedule."  RN Spoke with patient. He stated that he "did not want to elaborate on why I don't feel good."  Apt for lupron/xgeva will be post poned for 1 week.  Pt states that he has not yet received his xtstandi. I will f/u with [facility] to determine the status of his prescription.

## 2016-07-23 NOTE — Telephone Encounter (Signed)
Contacted [facility]- since pt has not heard anything regarding his prescription status for xtandi.  Per [facility]- pt's insurance - card on file expired-[facility] did not have new card; insurance information for patient provided to [facility].   [facility] could not process pt's insurance and requested the entire script and pt's demographic and insurance be faxed to [facility].

## 2016-07-26 ENCOUNTER — Telehealth: Payer: Self-pay | Admitting: *Deleted

## 2016-07-26 NOTE — Telephone Encounter (Signed)
Prior auth completed for xstandi with [employer].  Approved 07/26/2016 through 07/26/2017. auth # [id number].  rn contacted [facility] - spoke with Ronalee Belts to let him know that the rx was approved by pt's insurance.  approximately 45 mins spent in prior auth process.

## 2016-07-30 ENCOUNTER — Inpatient Hospital Stay (HOSPITAL_BASED_OUTPATIENT_CLINIC_OR_DEPARTMENT_OTHER): Payer: BLUE CROSS/BLUE SHIELD | Admitting: Internal Medicine

## 2016-07-30 ENCOUNTER — Inpatient Hospital Stay: Payer: BLUE CROSS/BLUE SHIELD | Attending: Internal Medicine

## 2016-07-30 ENCOUNTER — Inpatient Hospital Stay: Payer: BLUE CROSS/BLUE SHIELD

## 2016-07-30 ENCOUNTER — Telehealth: Payer: Self-pay | Admitting: *Deleted

## 2016-07-30 VITALS — BP 112/68 | HR 65 | Temp 96.6°F | Resp 22 | Ht 68.0 in | Wt 208.9 lb

## 2016-07-30 DIAGNOSIS — E785 Hyperlipidemia, unspecified: Secondary | ICD-10-CM | POA: Diagnosis not present

## 2016-07-30 DIAGNOSIS — C61 Malignant neoplasm of prostate: Secondary | ICD-10-CM

## 2016-07-30 DIAGNOSIS — I252 Old myocardial infarction: Secondary | ICD-10-CM

## 2016-07-30 DIAGNOSIS — Z7984 Long term (current) use of oral hypoglycemic drugs: Secondary | ICD-10-CM | POA: Diagnosis not present

## 2016-07-30 DIAGNOSIS — C771 Secondary and unspecified malignant neoplasm of intrathoracic lymph nodes: Secondary | ICD-10-CM

## 2016-07-30 DIAGNOSIS — K5900 Constipation, unspecified: Secondary | ICD-10-CM

## 2016-07-30 DIAGNOSIS — M2550 Pain in unspecified joint: Secondary | ICD-10-CM | POA: Diagnosis not present

## 2016-07-30 DIAGNOSIS — I129 Hypertensive chronic kidney disease with stage 1 through stage 4 chronic kidney disease, or unspecified chronic kidney disease: Secondary | ICD-10-CM

## 2016-07-30 DIAGNOSIS — Z79899 Other long term (current) drug therapy: Secondary | ICD-10-CM | POA: Diagnosis not present

## 2016-07-30 DIAGNOSIS — F329 Major depressive disorder, single episode, unspecified: Secondary | ICD-10-CM | POA: Insufficient documentation

## 2016-07-30 DIAGNOSIS — N189 Chronic kidney disease, unspecified: Secondary | ICD-10-CM | POA: Diagnosis not present

## 2016-07-30 DIAGNOSIS — G893 Neoplasm related pain (acute) (chronic): Secondary | ICD-10-CM | POA: Insufficient documentation

## 2016-07-30 DIAGNOSIS — R5383 Other fatigue: Secondary | ICD-10-CM | POA: Insufficient documentation

## 2016-07-30 DIAGNOSIS — Z79818 Long term (current) use of other agents affecting estrogen receptors and estrogen levels: Secondary | ICD-10-CM | POA: Insufficient documentation

## 2016-07-30 DIAGNOSIS — F17210 Nicotine dependence, cigarettes, uncomplicated: Secondary | ICD-10-CM

## 2016-07-30 DIAGNOSIS — E669 Obesity, unspecified: Secondary | ICD-10-CM | POA: Insufficient documentation

## 2016-07-30 DIAGNOSIS — C7951 Secondary malignant neoplasm of bone: Secondary | ICD-10-CM

## 2016-07-30 DIAGNOSIS — E1165 Type 2 diabetes mellitus with hyperglycemia: Secondary | ICD-10-CM

## 2016-07-30 DIAGNOSIS — F419 Anxiety disorder, unspecified: Secondary | ICD-10-CM | POA: Diagnosis not present

## 2016-07-30 DIAGNOSIS — Z636 Dependent relative needing care at home: Secondary | ICD-10-CM | POA: Diagnosis not present

## 2016-07-30 LAB — COMPREHENSIVE METABOLIC PANEL
ALK PHOS: 121 U/L (ref 38–126)
ALT: 25 U/L (ref 17–63)
AST: 40 U/L (ref 15–41)
Albumin: 3.3 g/dL — ABNORMAL LOW (ref 3.5–5.0)
Anion gap: 17 — ABNORMAL HIGH (ref 5–15)
BILIRUBIN TOTAL: 0.6 mg/dL (ref 0.3–1.2)
BUN: 17 mg/dL (ref 6–20)
CALCIUM: 9.9 mg/dL (ref 8.9–10.3)
CO2: 18 mmol/L — ABNORMAL LOW (ref 22–32)
Chloride: 98 mmol/L — ABNORMAL LOW (ref 101–111)
Creatinine, Ser: 1.61 mg/dL — ABNORMAL HIGH (ref 0.61–1.24)
GFR calc Af Amer: 52 mL/min — ABNORMAL LOW (ref >60)
GFR calc non Af Amer: 45 mL/min — ABNORMAL LOW (ref >60)
GLUCOSE: 253 mg/dL — AB (ref 65–99)
Potassium: 4.7 mmol/L (ref 3.5–5.1)
Sodium: 133 mmol/L — ABNORMAL LOW (ref 135–145)
TOTAL PROTEIN: 7.3 g/dL (ref 6.5–8.1)

## 2016-07-30 LAB — CBC WITH DIFFERENTIAL/PLATELET
BASOS PCT: 1 %
Basophils Absolute: 0.1 10*3/uL (ref 0–0.1)
EOS ABS: 0.1 10*3/uL (ref 0–0.7)
Eosinophils Relative: 2 %
HEMATOCRIT: 33.9 % — AB (ref 40.0–52.0)
HEMOGLOBIN: 11.3 g/dL — AB (ref 13.0–18.0)
LYMPHS ABS: 0.8 10*3/uL — AB (ref 1.0–3.6)
Lymphocytes Relative: 10 %
MCH: 28.9 pg (ref 26.0–34.0)
MCHC: 33.3 g/dL (ref 32.0–36.0)
MCV: 86.8 fL (ref 80.0–100.0)
MONO ABS: 0.6 10*3/uL (ref 0.2–1.0)
MONOS PCT: 8 %
NEUTROS PCT: 79 %
Neutro Abs: 5.9 10*3/uL (ref 1.4–6.5)
Platelets: 425 10*3/uL (ref 150–440)
RBC: 3.91 MIL/uL — ABNORMAL LOW (ref 4.40–5.90)
RDW: 16.8 % — AB (ref 11.5–14.5)
WBC: 7.4 10*3/uL (ref 3.8–10.6)

## 2016-07-30 LAB — PSA: PSA: 748 ng/mL — ABNORMAL HIGH (ref 0.00–4.00)

## 2016-07-30 MED ORDER — MORPHINE SULFATE 15 MG PO TABS: 15.0000 mg | ORAL_TABLET | Freq: Three times a day (TID) | ORAL | 0 refills | Status: DC | PRN

## 2016-07-30 MED ORDER — LEUPROLIDE ACETATE (4 MONTH) 30 MG IM KIT
30.0000 mg | PACK | Freq: Once | INTRAMUSCULAR | Status: AC
  Administered 2016-07-30: 30 mg via INTRAMUSCULAR
  Filled 2016-07-30: qty 30

## 2016-07-30 MED ORDER — DENOSUMAB 120 MG/1.7ML ~~LOC~~ SOLN
120.0000 mg | Freq: Once | SUBCUTANEOUS | Status: AC
  Administered 2016-07-30: 120 mg via SUBCUTANEOUS
  Filled 2016-07-30: qty 1.7

## 2016-07-30 NOTE — Progress Notes (Signed)
Patient here in clinic today for prostate cancer f/u. His wife, who has multiple sclerosis, accompanied pt today for his apt. Wife stated that patient has had multiple life stressors. 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 Pt expresses emotional distress r/t to 'fear of the unknown.' offered chaplin services, counseling services and even discussed meeting with psychiatry-however patient refused. Patient provided with resources on depression. Pt was not suicidal but expressed hopelessness. However, he stated that if "anything happens to my wife's health, I am consider doing something to myself, but right now, I want to live as long as I can." pt expressed, "frustration over my disease progression, how did this happen to me. I don't understand my treatment plan and the goals of care. I think this is why I have no outlook on life right now. I am very frustrated. I personally just want to stay in the bed and sleep all the time and not get out of bed again. I feel like I am willing to do whatever it takes to get a cure on my cancer, even if I have to be nauseated and vomiting and have suffering side effects. I'm not going to die. It's not gonna happen. I am just angry and frustrated at my life situation right now."

## 2016-07-30 NOTE — Telephone Encounter (Signed)
Changes in behavior, confused at times then fine for a while. Depressed despondent, quick tempered. Has an appt this afternoon to see md

## 2016-07-30 NOTE — Telephone Encounter (Signed)
Per md- will address patient's concerns at today's appointment.

## 2016-07-30 NOTE — Progress Notes (Signed)
[facility] OFFICE PROGRESS NOTE  Patient Care Team: Tracie Harrier, MD as PCP - General (Internal Medicine)   SUMMARY OF ONCOLOGIC HISTORY:  Oncology History   # 2014- PROSTATE CA [s/p Bx-Gleason score 5+4; s/p EBRT [Dr.Crystal];July 2015- Biochem Relapse [PSA-15]; non-compliance with Lupron; JAN 2016-PSA 50   # MAY 2016- PSA-100/Bone scan- Bone mets [s/p EBRT Right Hip]; CT A/P- PELVIC/RP LN;   # July 2016- ? CASTRATE RESISTANT START ZYTIGA + Prednisone; AUG PSA 3.2; OCT 2016- CT- Improved Pelvic/RP LN ~10-17MM; Multiple bone lesions;OCT 2016-Bone scan- Neg for progressive mets.OCT 2016- 1.01   # AUG 2017- PSA 43/bone scan- worse/CT- stable/improved- START Taxotere q 3W x 6 cycles. 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  Patient did not keep his appointment up with radiation oncology. Complains of joint pains- follow-up. He feels depressed. Howevera no suicidal ideation. He wants to take care of his wife who has multiple sclerosis. Diarrhea resolved. Patient continues to complain of chronic fatigue.  No weight loss. Denies any tingling and numbness in his legs. No new swelling in the legs. No shortness of breath. He is on morphine 15 mg prn- pain is poorly controlled.  REVIEW OF SYSTEMS:  A complete 10 point  review of system is done which is negative except mentioned above/history of present illness.   PAST MEDICAL HISTORY :  Past Medical History:  Diagnosis Date  . Anxiety   . Cancer [facility])    states he had cancer in his left forearm but is unaware of type and was told his body took care of it  . Chronic kidney disease    stage 3  . Depression   . Essential hypertension, benign   . Family history of adverse reaction to anesthesia    adopted  . Myocardial infarction   . Obesity   . Other and unspecified hyperlipidemia   . PONV (postoperative nausea and vomiting)    nausea  . Prostate cancer ([place])   . Type II or unspecified type diabetes mellitus without mention of complication, uncontrolled    Metformin  . Varicosities     PAST SURGICAL HISTORY :   Past Surgical History:  Procedure Laterality Date  . CARDIAC CATHETERIZATION    . COLONOSCOPY  2013  . COLONOSCOPY WITH PROPOFOL N/A 01/23/2015   Procedure: COLONOSCOPY WITH PROPOFOL;  Surgeon: Hulen Luster, MD;  Location: [facility] ENDOSCOPY;  Service: Gastroenterology;  Laterality: N/A;  . INTRAOPERATIVE ARTERIOGRAM     for vascular disease  . TONSILLECTOMY    . TOTAL HIP ARTHROPLASTY Right 05/01/2015   Procedure: TOTAL HIP ARTHROPLASTY ANTERIOR APPROACH;  Surgeon: Hessie Knows, MD;  Location: [facility] ORS;  Service: Orthopedics;  Laterality: Right;    FAMILY HISTORY :   Family History  Problem Relation Age of Onset  . Adopted:  Yes    SOCIAL HISTORY:   Social History  Substance Use Topics  . Smoking status: Current Every Day Smoker    Packs/day: 0.50    Types: Cigarettes  . Smokeless tobacco: Never Used  . Alcohol use 0.0 oz/week     Comment: 28 12 ounce beers per week    ALLERGIES:  is allergic to codeine sulfate and prednisone.  MEDICATIONS:  Current Outpatient Prescriptions  Medication Sig Dispense Refill  . ALPRAZolam (XANAX) 0.5 MG tablet Take 0.5 mg by mouth 2 (two) times daily as needed for sleep.     Marland Kitchen amLODipine  (NORVASC) 10 MG tablet Take 10 mg by mouth daily.    Marland Kitchen atenolol (TENORMIN) 100 MG tablet Take 100 mg by mouth daily.    . Cholecalciferol (VITAMIN D) 2000 units tablet Take 2,000 Units by mouth daily.     . isosorbide mononitrate (IMDUR) 30 MG 24 hr tablet Take 30 mg by mouth daily.    Marland Kitchen lisinopril (PRINIVIL,ZESTRIL) 20 MG tablet Take 20 mg by mouth daily.     . metFORMIN (GLUCOPHAGE) 1000 MG tablet Take 1,000 mg by mouth daily with breakfast.     . morphine (MSIR) 15 MG tablet Take 1 tablet (15 mg total) by mouth every 8 (eight) hours as needed for severe pain. 90 tablet 0  . simvastatin (ZOCOR) 40 MG tablet Take 40 mg by mouth every evening.    . tamsulosin (FLOMAX) 0.4 MG CAPS capsule Take 1 capsule (0.4 mg total) by mouth daily after supper. 30 capsule 6  . Cyanocobalamin (RA VITAMIN B-12 TR) 1000 MCG TBCR Take 1 tablet by mouth daily.     . enzalutamide (XTANDI) 40 MG capsule Take 4 capsules (160 mg total) by mouth daily. (Patient not taking: Reported on 07/30/2016) 120 capsule 4  . fentaNYL (DURAGESIC - DOSED MCG/HR) 25 MCG/HR patch Place 1 patch (25 mcg total) onto the skin every 3 (three) days. (Patient not taking: Reported on 07/30/2016) 10 patch 0  . glimepiride (AMARYL) 4 MG tablet Take 1 tablet (4 mg total) by mouth daily with breakfast. 30 tablet 3  . Multiple Vitamins-Minerals (MEGA MULTI MEN PO) Take 1 tablet by mouth daily.     . naproxen (NAPROSYN) 500 MG tablet Take 1 tablet (500 mg total) by mouth 2 (two) times daily with a meal. (Patient not taking: Reported on 06/25/2016) 20 tablet 0   No current facility-administered medications for this visit.     PHYSICAL EXAMINATION: ECOG PERFORMANCE STATUS: 2 - Symptomatic, <50% confined to bed  BP 112/68 (BP Location: Left Arm, Patient Position: Sitting)   Pulse 65   Temp (!) 96.6 F (35.9 C) (Tympanic)   Resp (!) 22   Ht 5\' 8"  (1.727 m)   Wt 208 lb 14.4 oz (94.8 kg)   BMI 31.76 kg/m   Filed Weights   07/30/16 1417  Weight:  208 lb 14.4 oz (94.8 kg)    GENERAL: Well-nourished well-developed; Alert, no distress and comfortable.  He can sit on exam table by himself.  Obese.  He is Alone. EYES: no pallor or icterus OROPHARYNX: no thrush or ulceration; good dentition  NECK: supple, no masses felt LYMPH:  no palpable lymphadenopathy in the cervical, axillary or inguinal regions LUNGS: clear to auscultation and  No wheeze or crackles HEART/CVS: regular rate & rhythm and no murmurs; No lower extremity edema ABDOMEN:abdomen soft, non-tender and normal bowel sounds Musculoskeletal:no cyanosis of digits and no clubbing  PSYCH: alert & oriented x  3 with fluent speech NEURO: no focal motor/sensory deficits SKIN:  no rashes or significant lesions  LABORATORY DATA:  I have reviewed the data as listed    Component Value Date/Time   NA 133 (L) 07/30/2016 1322   K 4.7 07/30/2016 1322   CL 98 (L) 07/30/2016 1322   CO2 18 (L) 07/30/2016 1322   GLUCOSE 253 (H) 07/30/2016 1322   BUN 17 07/30/2016 1322   CREATININE 1.61 (H) 07/30/2016 1322   CALCIUM 9.9 07/30/2016 1322   PROT 7.3 07/30/2016 1322   ALBUMIN 3.3 (L) 07/30/2016 1322   AST 40 07/30/2016 1322   ALT 25 07/30/2016 1322   ALKPHOS 121 07/30/2016 1322   BILITOT 0.6 07/30/2016 1322   GFRNONAA 45 (L) 07/30/2016 1322   GFRAA 52 (L) 07/30/2016 1322    No results found for: SPEP, UPEP  Lab Results  Component Value Date   WBC 7.4 07/30/2016   NEUTROABS 5.9 07/30/2016   HGB 11.3 (L) 07/30/2016   HCT 33.9 (L) 07/30/2016   MCV 86.8 07/30/2016   PLT 425 07/30/2016      Chemistry      Component Value Date/Time   NA 133 (L) 07/30/2016 1322   K 4.7 07/30/2016 1322   CL 98 (L) 07/30/2016 1322   CO2 18 (L) 07/30/2016 1322   BUN 17 07/30/2016 1322   CREATININE 1.61 (H) 07/30/2016 1322      Component Value Date/Time   CALCIUM 9.9 07/30/2016 1322   ALKPHOS 121 07/30/2016 1322   AST 40 07/30/2016 1322   ALT 25 07/30/2016 1322   BILITOT 0.6 07/30/2016 1322      Results for TEL, HEVIA (MRN [id number]) as of 06/25/2016 10:29  Ref. Range 10/24/2015 13:00 12/02/2015 14:40 03/04/2016 09:00 05/28/2016 11:20 06/18/2016 10:28  PSA Latest Ref Range: 0.00 - 4.00 ng/mL 24.20 (H) 43.69 (H) 68.03 (H) 262.00 (H) 355.00 (H)   IMPRESSION: 1. Multiple areas of relative increased attenuation identified in the right lobe of the liver on the previous study are not evident today. These were felt to be likely related to benign etiology previously and slight difference in bolus timing could account for lack of visualization today. 2. Stable appearance of the index lymph nodes identified previously in the abdomen and pelvis. No new or progressive lymphadenopathy on today's study. 3. Fairly widespread sclerotic bony metastases with apparent slight progression in the bony pelvis with relative stability of lesions identified in the thoracolumbar spine. 4. Interval stability of multiple lesions identified in the inferior kidneys bilaterally. Some of these have attenuation higher than would be expected for a simple cyst and while likely cysts complicated by proteinaceous debris or hemorrhage, attention on follow-up recommended. 5.  Abdominal Aortic Atherosclerois (ICD10-170.0)   Electronically Signed   By: Misty Stanley M.D.   On: 06/21/2016 13:59 ------------------------------------------------------------------------------ IMPRESSION: Progression of diffuse osseous metastatic disease as detailed above.   Electronically Signed   By: Genia Del M.D.   On: 06/21/2016 15:33 RADIOGRAPHIC STUDIES:   ASSESSMENT & PLAN:   Malignant neoplasm of prostate ([place]) # METASTATIC CASTRATE RESISTANT PROSTATE CANCER- Completed Taxotere palliative chemotherapy on 04/01/16 x 6 cycles; FEB 2018- Unfortunately imaging/bone scan shows progressive disease/PSA rising. CT scan shows no obvious physical metastases apart from stable lymphadenopathy. Lupron q 62m- last on  03/22/16; again due in march 2018/due today.   #  Discussed re: x-tandi. Discussed the potential side effects of extending including but not limited to seizures/fatigue. Not available yet. I discussed that the  median survival of patients with castrate resistant prostate cancer is in the order of 1-2 years.   # Re-refer to Dr.Crystal re: Xofigo; and EBRT to left hip. Also discussed the role of radium in the treatment of bone limited castrate resistant prostate cancer. Patient agrees to be referred to radiation.  #  Reviewed/counselled regarding the goals of care- being palliative/treatment are usually indefinite-until progression or side effects. Goal is to maintain quality of life as the disease is incurable.  # Chronic diarrhea- resolved; after stopping steroids [as per pt]; resolved; currently constipated; recomemnd stool softner.   # chronic pain- sec to cancer- progressive- recommend fenatnyl 25 mcg; with morphine prn. New script given.   # Follow-up with me in approximately 4 weeks; X-geva.; labs PSA.     Cammie Sickle, MD 07/31/2016 12:51 PM

## 2016-07-30 NOTE — Assessment & Plan Note (Addendum)
#   METASTATIC CASTRATE RESISTANT PROSTATE CANCER- Completed Taxotere palliative chemotherapy on 04/01/16 x 6 cycles; FEB 2018- Unfortunately imaging/bone scan shows progressive disease/PSA rising. CT scan shows no obvious physical metastases apart from stable lymphadenopathy. Lupron q 53m- last on 03/22/16; again due in march 2018/due today.   #  Discussed re: x-tandi. Discussed the potential side effects of extending including but not limited to seizures/fatigue. Not available yet. I discussed that the median survival of patients with castrate resistant prostate cancer is in the order of 1-2 years.   # Re-refer to Dr.Crystal re: Xofigo; and EBRT to left hip. Also discussed the role of radium in the treatment of bone limited castrate resistant prostate cancer. Patient agrees to be referred to radiation.  #  Reviewed/counselled regarding the goals of care- being palliative/treatment are usually indefinite-until progression or side effects. Goal is to maintain quality of life as the disease is incurable.  # Chronic diarrhea- resolved; after stopping steroids [as per pt]; resolved; currently constipated; recomemnd stool softner.   # chronic pain- sec to cancer- progressive- recommend fenatnyl 25 mcg; with morphine prn. New script given.   # Follow-up with me in approximately 4 weeks; X-geva.; labs PSA.

## 2016-07-31 ENCOUNTER — Telehealth: Payer: Self-pay | Admitting: Internal Medicine

## 2016-07-31 NOTE — Telephone Encounter (Signed)
Add x-geva when pt comes back.

## 2016-08-02 NOTE — Telephone Encounter (Signed)
Taken care of by scheduling

## 2016-08-04 ENCOUNTER — Ambulatory Visit: Payer: BLUE CROSS/BLUE SHIELD | Attending: Radiation Oncology | Admitting: Radiation Oncology

## 2016-08-10 NOTE — Progress Notes (Unsigned)
PSN called patient and left a message on his answering machine, asking that he call back to discuss his financial concerns.

## 2016-08-13 ENCOUNTER — Telehealth: Payer: Self-pay | Admitting: *Deleted

## 2016-08-13 NOTE — Telephone Encounter (Signed)
The phone number [facility] provided was for [employer] [place]. I attempted to contact [employer] of [place] and the company is currently closed due to Easter holiday. Unable to speak to representative for this reason.

## 2016-08-13 NOTE — Telephone Encounter (Signed)
Contacted [facility] to determine if [facility] was shipped with patient- office has not been updated on pt's shipment.  RN Spoke with Terrence Dupont at [place]. Per [facility] pt's case was made 'inactive. States that insurance has flagged the case for prior auth and prior Josem Kaufmann was not submitted. I explained that I submitted the prior auth on 3/12. Prior Josem Kaufmann was approved and completed for xstandi with [employer]. I even spoke with Ronalee Belts at [place] and provided this prior auth information to him on the same date. Prior auth completed for xstandi with [place].  explained that I had the dates of approval for dates of service 07/26/2016 through 07/26/2017. auth # [id number].  She states that [place] still didn't update their webpage for the approval for the xstandi, therefore, [facility] closed pt's case. I asked her why [facility] didn't notifiy our office. She stated that she has documented that they left several msg for the patient to return their phone call.  She stated that I needed to call [employer] to determine why the auth number and xstandi was not being approved when they submit the claim on their side. [place] at [phone].

## 2016-08-17 NOTE — Telephone Encounter (Signed)
1500-08/17/16-Rn contacted [employer] today-spoke with [employer] which is a 3rd party prior auth for [employer] specialty drug- spoke with Lelan Pons to determine the status of the prior auth. Was told by prior auth team that the xstandi was approved. I explained that [facility] was not able to submit the information to [employer] as the bc[employer] webpage would not allow [facility] to process the xstandi for approval.  I explained that [facility] asked me to contact [employer]. To determine why this was occurring.  Per [facility] needs to contact the prior auth team or to portal to view the approval.  08/17/16-I personally contacted [facility] back at 1536. Spoke with [place]. Asked [place] to facility communications to [facility] prior [place] team to determine why claim is not able to submit. Explained that I spoke with Terrence Dupont last Friday to determine why patient still has not received the drug. Terrence Dupont explained at that phone call that there was an error in submitting the claim within the portal. Was advised at that time to personally contact [employer] to determine why the claim was not able to be submitted. I explained that I spoke with [employer] and was told that the drug was approved. However, [employer]IM encouraged me to contact [facility] back to resolve their IT issues.  While on the phone, [place] personally contacted [employer] back with a 3-way call.. Apparently BCBS had Dr. Aletha Halim office listed as the dispensing pharmacy. I explained to [employer]IM and [place] that this is incorrect. [facility] is the dispensing pharmacy.  AIM stated that that would get this updated in the system and [facility] should be able to dispense the medication from that point. AIM asked a series of questions: 1. Whether the patient requested [facility]. I told [employer]IM representative that the rx was transferred to biologics as [facility] was the preferred dispensing pharmacy and the patient had used [facility] previously for his dispensing pharmacy for another mail order drug. 2. AIM confirmed that [facility] will not be  shipping any other drug beside xstandi to the patient. 3. AIM confirmed that it was fine to use [facility] fine however, due to benefits-pt may have a high copay for the drug.  [place] at [place] stated that the pt could apply for copay assistance for xstandi if there is a financial hardship. [place] attempted  To submit the claim twice while on phone with [employer]IM. Aim stated that their end for prior auth was demonstrating approval. Per [place], test claim is still "prior Josem Kaufmann is required. rejecting the claim."  [place] was provided with Josem Kaufmann [id number] (07/26/16-07/26/2017) by [employer]IM to see if helped submit the claim. Even with this information, [place] still received a rejection for the claim. Per [place] - She normally submits the claim through [facility] to process a test claim. When doing so, this is where she is getting the rejection. She asked AIM to provide information for the help desk. Per AIM, she doesn't have the IT's phone number. [place]ique was instructed to contact [place] help desk to determine why the claim doesn't want to cross over into their system.  [place] stated that she would contact the IT help desk and then notify our office as soon as [facility] can get the claim to submit.   Total time spent on this phone call was approximately 51 mins.

## 2016-08-31 ENCOUNTER — Telehealth: Payer: Self-pay | Admitting: Internal Medicine

## 2016-08-31 NOTE — Telephone Encounter (Signed)
Will plan to get guardiant testing; when pt is here tomorrow.

## 2016-09-01 ENCOUNTER — Inpatient Hospital Stay: Payer: BLUE CROSS/BLUE SHIELD

## 2016-09-01 ENCOUNTER — Telehealth: Payer: Self-pay | Admitting: *Deleted

## 2016-09-01 ENCOUNTER — Inpatient Hospital Stay: Payer: BLUE CROSS/BLUE SHIELD | Admitting: Internal Medicine

## 2016-09-01 ENCOUNTER — Inpatient Hospital Stay: Payer: BLUE CROSS/BLUE SHIELD | Attending: Internal Medicine

## 2016-09-01 NOTE — Telephone Encounter (Signed)
-----   Message from [place] sent at 09/01/2016  2:18 PM EDT ----- Regarding: cancel Contact: [phone] Pt called that he cant walk,move and feels really bad...canceling his appt.  He wanted to talk to [facility]...  Thx

## 2016-09-01 NOTE — Telephone Encounter (Signed)
Attempted phone call return on pt's home- left vm that call was being returned.  RN Unable to leave vm on pt's cell' vm states, "this is annie, please leave a msg."

## 2016-09-03 NOTE — Telephone Encounter (Signed)
Left vm for patient-returning his phone call. No answer.

## 2016-09-08 ENCOUNTER — Telehealth: Payer: Self-pay | Admitting: *Deleted

## 2016-09-08 NOTE — Telephone Encounter (Signed)
Patient states "I'm just feeling like I'm dying. I'm almost bedridden. I can't eat anymore. I have not been able to walk sometimes due to pain. I need someone to help me to get out of bed. I have no motivation to get up and go. I feel more depressed. I know I need to reschedule another appointment. I would like to come in the afternoons if possible. No one has prepared me for what I am feeling right now. I need you to be open with me on my prognosis. I'm just so depressed right now."  pt also requested RF on morphine- states that he is completely out of the medication.  I reiterated to patient the importance of keeping his appointment. Pt provided with lab apt at 215 pm on Friday 09/10/16 and md apt at 230 pm on Friday. Pt accepted these apts and will pick up morphine rx when he comes for his apt. Also briefly mentioned the role of the guardant 360 testing. Pt agreeable and will further discuss the purpose of the tests at his appointment.

## 2016-09-08 NOTE — Telephone Encounter (Signed)
left vm requesting return phone call on mobile phone and home phone.

## 2016-09-10 ENCOUNTER — Inpatient Hospital Stay: Payer: BLUE CROSS/BLUE SHIELD | Attending: Internal Medicine

## 2016-09-10 ENCOUNTER — Inpatient Hospital Stay: Payer: BLUE CROSS/BLUE SHIELD | Attending: Internal Medicine | Admitting: Internal Medicine

## 2016-09-10 DIAGNOSIS — C7951 Secondary malignant neoplasm of bone: Secondary | ICD-10-CM | POA: Insufficient documentation

## 2016-09-10 DIAGNOSIS — Z9221 Personal history of antineoplastic chemotherapy: Secondary | ICD-10-CM | POA: Insufficient documentation

## 2016-09-10 DIAGNOSIS — C771 Secondary and unspecified malignant neoplasm of intrathoracic lymph nodes: Secondary | ICD-10-CM | POA: Insufficient documentation

## 2016-09-10 DIAGNOSIS — Z79899 Other long term (current) drug therapy: Secondary | ICD-10-CM | POA: Diagnosis not present

## 2016-09-10 DIAGNOSIS — I129 Hypertensive chronic kidney disease with stage 1 through stage 4 chronic kidney disease, or unspecified chronic kidney disease: Secondary | ICD-10-CM | POA: Insufficient documentation

## 2016-09-10 DIAGNOSIS — F17210 Nicotine dependence, cigarettes, uncomplicated: Secondary | ICD-10-CM | POA: Insufficient documentation

## 2016-09-10 DIAGNOSIS — G893 Neoplasm related pain (acute) (chronic): Secondary | ICD-10-CM | POA: Insufficient documentation

## 2016-09-10 DIAGNOSIS — E119 Type 2 diabetes mellitus without complications: Secondary | ICD-10-CM | POA: Insufficient documentation

## 2016-09-10 DIAGNOSIS — Z7984 Long term (current) use of oral hypoglycemic drugs: Secondary | ICD-10-CM | POA: Insufficient documentation

## 2016-09-10 DIAGNOSIS — R634 Abnormal weight loss: Secondary | ICD-10-CM | POA: Insufficient documentation

## 2016-09-10 DIAGNOSIS — C61 Malignant neoplasm of prostate: Secondary | ICD-10-CM | POA: Diagnosis not present

## 2016-09-10 DIAGNOSIS — Z923 Personal history of irradiation: Secondary | ICD-10-CM | POA: Insufficient documentation

## 2016-09-10 DIAGNOSIS — I252 Old myocardial infarction: Secondary | ICD-10-CM | POA: Insufficient documentation

## 2016-09-10 DIAGNOSIS — N183 Chronic kidney disease, stage 3 (moderate): Secondary | ICD-10-CM | POA: Insufficient documentation

## 2016-09-10 DIAGNOSIS — E669 Obesity, unspecified: Secondary | ICD-10-CM | POA: Insufficient documentation

## 2016-09-10 DIAGNOSIS — F329 Major depressive disorder, single episode, unspecified: Secondary | ICD-10-CM | POA: Insufficient documentation

## 2016-09-10 DIAGNOSIS — D649 Anemia, unspecified: Secondary | ICD-10-CM | POA: Insufficient documentation

## 2016-09-10 DIAGNOSIS — I700 Atherosclerosis of aorta: Secondary | ICD-10-CM | POA: Insufficient documentation

## 2016-09-10 LAB — CBC WITH DIFFERENTIAL/PLATELET
BASOS PCT: 1 %
Basophils Absolute: 0.1 10*3/uL (ref 0–0.1)
EOS ABS: 0.1 10*3/uL (ref 0–0.7)
EOS PCT: 1 %
HCT: 24.2 % — ABNORMAL LOW (ref 40.0–52.0)
HEMOGLOBIN: 8.3 g/dL — AB (ref 13.0–18.0)
Lymphocytes Relative: 12 %
Lymphs Abs: 0.8 10*3/uL — ABNORMAL LOW (ref 1.0–3.6)
MCH: 28.2 pg (ref 26.0–34.0)
MCHC: 34.2 g/dL (ref 32.0–36.0)
MCV: 82.3 fL (ref 80.0–100.0)
Monocytes Absolute: 0.6 10*3/uL (ref 0.2–1.0)
Monocytes Relative: 9 %
NEUTROS PCT: 77 %
Neutro Abs: 5.4 10*3/uL (ref 1.4–6.5)
PLATELETS: 359 10*3/uL (ref 150–440)
RBC: 2.94 MIL/uL — AB (ref 4.40–5.90)
RDW: 19.3 % — ABNORMAL HIGH (ref 11.5–14.5)
WBC: 7 10*3/uL (ref 3.8–10.6)

## 2016-09-10 LAB — COMPREHENSIVE METABOLIC PANEL
ALBUMIN: 3 g/dL — AB (ref 3.5–5.0)
ALK PHOS: 186 U/L — AB (ref 38–126)
ALT: 21 U/L (ref 17–63)
ANION GAP: 14 (ref 5–15)
AST: 29 U/L (ref 15–41)
BUN: 12 mg/dL (ref 6–20)
CALCIUM: 8.4 mg/dL — AB (ref 8.9–10.3)
CHLORIDE: 104 mmol/L (ref 101–111)
CO2: 15 mmol/L — AB (ref 22–32)
CREATININE: 1.4 mg/dL — AB (ref 0.61–1.24)
GFR calc Af Amer: >60 mL/min (ref >60)
GFR calc non Af Amer: 53 mL/min — ABNORMAL LOW (ref >60)
GLUCOSE: 140 mg/dL — AB (ref 65–99)
Potassium: 4 mmol/L (ref 3.5–5.1)
SODIUM: 133 mmol/L — AB (ref 135–145)
Total Bilirubin: 1.2 mg/dL (ref 0.3–1.2)
Total Protein: 7.2 g/dL (ref 6.5–8.1)

## 2016-09-10 LAB — PSA: PSA: 1008 ng/mL — ABNORMAL HIGH (ref 0.00–4.00)

## 2016-09-10 MED ORDER — DENOSUMAB 120 MG/1.7ML ~~LOC~~ SOLN
120.0000 mg | Freq: Once | SUBCUTANEOUS | Status: AC
  Administered 2016-09-10: 120 mg via SUBCUTANEOUS
  Filled 2016-09-10: qty 1.7

## 2016-09-10 MED ORDER — FENTANYL 75 MCG/HR TD PT72: 75.0000 ug | MEDICATED_PATCH | TRANSDERMAL | 0 refills | Status: DC

## 2016-09-10 MED ORDER — MORPHINE SULFATE 15 MG PO TABS: 15.0000 mg | ORAL_TABLET | Freq: Four times a day (QID) | ORAL | 0 refills | Status: DC | PRN

## 2016-09-10 NOTE — Assessment & Plan Note (Addendum)
#   METASTATIC CASTRATE RESISTANT PROSTATE CANCER- Completed Taxotere palliative chemotherapy on 04/01/16 x 6 cycles; FEB 2018- Unfortunately imaging/bone scan shows progressive disease/rapidly rising PSA ~ 700. CT scan shows no obvious visceral  metastases apart from stable lymphadenopathy. Patient obviously symptomatic with significant worsening of the pain. Check guardian testing.   # I had a long discussion the patient and the family regarding the significant progression of the disease; and overall poor prognosis. Patient is interested in chemotherapy/treatment options.   # I would recommend cabazitaxel  [20mg /m2] every 3 weeks. Discussed the potential side effects including but not limited to-increasing fatigue, nausea vomiting, diarrhea, hair loss, sores in the mouth, increase risk of infection and also neuropathy. Discussed in specific regarding diarrhea; and prevention with Lomotil/Imodium. Prescription for Lomotil given.   # Growth factor-Neulasta/On pro would be given as prophylaxis for chemotherapy-induced neutropenia to prevent febrile neutropenias.   # Reviewed/counselled regarding the goals of care- being palliative/treatment are usually indefinite-until progression or side effects. Goal is to maintain quality of life as the disease is incurable.  # Anemia hemoglobin of 8.3 at baseline [question secondary to CKD/bone marrow involvement by prostate cancer]. Recommend monitoring closely. Plan possible blood transfusion approximately 10 days post chemotherapy.   # CKD/creatinine worsening creatinine 1.6 most recently- recent CT scan in February no hydronephrosis. Monitor for now. If worse recommend ultrasound of the kidneys.   # Patient's family understands given his borderline performance status; comorbidities- high risk of adverse events of chemotherapy. Patient understands the risks involved- wants to proceed with chemotherapy. Unfortunately patient is a poor prognosis; he and his family  understand it.   # Lupron q 47m- last on March 16th 2018; proceed with X-geva [4/27].   # chronic pain- sec to cancer- progressive- recommend fenatnyl 75 mcg; with morphine prn. New script given.   # Start chemotherapy next week; follow-up with me in 10 days post chemotherapy; labs possible hold tube IV fluids.   # I reviewed the blood work- with the patient in detail; also reviewed the imaging independently [as summarized above]; and with the patient in detail.

## 2016-09-10 NOTE — Progress Notes (Signed)
[place] OFFICE PROGRESS NOTE  Patient Care Team: Tracie Harrier, MD as PCP - General (Internal Medicine)   SUMMARY OF ONCOLOGIC HISTORY:  Oncology History   # 2014- PROSTATE CA [s/p Bx-Gleason score 5+4; s/p EBRT [Dr.Crystal];July 2015- Biochem Relapse [PSA-15]; non-compliance with Lupron; JAN 2016-PSA 50   # MAY 2016- PSA-100/Bone scan- Bone mets [s/p EBRT Right Hip]; CT A/P- PELVIC/RP LN;   # July 2016- ? CASTRATE RESISTANT START ZYTIGA + Prednisone; AUG PSA 3.2; OCT 2016- CT- Improved Pelvic/RP LN ~10-17MM; Multiple bone lesions;OCT 2016-Bone scan- Neg for progressive mets.OCT 2016- 1.01   # AUG 2017- PSA 43/bone scan- worse/CT- stable/improved- START Taxotere q 3W x 6 cycles. 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  Patient has missed multiple appointments with [place] and also radiation oncology.  He continues to feel depressed continues to complain of significant pain. He complains of pain all over "but mostly in his knees and shoulders etc." He states that he has put "3 patches of the 25 g fentanyl"- without any improvement of his pain. He unfortunately did not call us if his worsening pain. Continues to complain of chronic fatigue.  Positive for weight loss. Denies any tingling and numbness. Denies and  swelling in legs. Today is accompanied by his wife/in a wheelchair secondary to multiple sclerosis. And his sister.  REVIEW OF SYSTEMS:  A complete 10 point review of system is done which is negative except mentioned above/history of present illness.   PAST MEDICAL HISTORY :  Past Medical History:  Diagnosis Date  . Anxiety   . Cancer [facility])    states he had cancer in his left forearm but is unaware of type and was told his body took care of it  . Chronic kidney disease    stage 3  . Depression   . Essential hypertension, benign   . Family history of adverse reaction to anesthesia    adopted  . Myocardial infarction   . Obesity   . Other and unspecified hyperlipidemia   . PONV (postoperative nausea and vomiting)    nausea  . Prostate cancer ([place])   . Type II or unspecified type diabetes mellitus without mention of complication, uncontrolled    Metformin  . Varicosities     PAST SURGICAL HISTORY :   Past Surgical History:  Procedure Laterality Date  . CARDIAC CATHETERIZATION    . COLONOSCOPY  2013  . COLONOSCOPY WITH PROPOFOL N/A 01/23/2015   Procedure: COLONOSCOPY WITH PROPOFOL;  Surgeon: Hulen Luster, MD;  Location: [facility] ENDOSCOPY;  Service: Gastroenterology;  Laterality: N/A;  . INTRAOPERATIVE ARTERIOGRAM     for vascular disease  . TONSILLECTOMY    . TOTAL HIP ARTHROPLASTY Right 05/01/2015   Procedure: TOTAL HIP ARTHROPLASTY ANTERIOR APPROACH;  Surgeon: Hessie Knows, MD;  Location: [facility]  ORS;  Service: Orthopedics;  Laterality: Right;    FAMILY HISTORY :   Family History  Problem Relation Age of Onset  . Adopted: Yes    SOCIAL HISTORY:   Social History  Substance Use Topics  . Smoking status: Current Every Day Smoker    Packs/day: 0.50    Types: Cigarettes  . Smokeless tobacco: Never Used  . Alcohol use 0.0 oz/week     Comment: 28 12 ounce beers per week    ALLERGIES:  is allergic to codeine sulfate and prednisone.  MEDICATIONS:  Current Outpatient  Prescriptions  Medication Sig Dispense Refill  . ALPRAZolam (XANAX) 0.5 MG tablet Take 0.5 mg by mouth 2 (two) times daily as needed for sleep.     Marland Kitchen amLODipine (NORVASC) 10 MG tablet Take 10 mg by mouth daily.    . fentaNYL (DURAGESIC - DOSED MCG/HR) 25 MCG/HR patch Place 1 patch (25 mcg total) onto the skin every 3 (three) days. 10 patch 0  . isosorbide mononitrate (IMDUR) 30 MG 24 hr tablet Take 30 mg by mouth daily.    Marland Kitchen lisinopril (PRINIVIL,ZESTRIL) 20 MG tablet Take 20 mg by mouth daily.     . metFORMIN (GLUCOPHAGE) 1000 MG tablet Take 1,000 mg by mouth daily with breakfast.     . morphine (MSIR) 15 MG tablet Take 1 tablet (15 mg total) by mouth every 6 (six) hours as needed for severe pain. 90 tablet 0  . naproxen (NAPROSYN) 500 MG tablet Take 1 tablet (500 mg total) by mouth 2 (two) times daily with a meal. 20 tablet 0  . simvastatin (ZOCOR) 40 MG tablet Take 40 mg by mouth every evening.    . tamsulosin (FLOMAX) 0.4 MG CAPS capsule Take 1 capsule (0.4 mg total) by mouth daily after supper. 30 capsule 6  . atenolol (TENORMIN) 100 MG tablet Take 100 mg by mouth daily.    . diphenoxylate-atropine (LOMOTIL) 2.5-0.025 MG tablet Take 1 tablet by mouth 4 (four) times daily as needed for diarrhea or loose stools. Take it along with immodium 60 tablet 0  . fentaNYL (DURAGESIC - DOSED MCG/HR) 75 MCG/HR Place 1 patch (75 mcg total) onto the skin every 3 (three) days. 5 patch 0  . glimepiride (AMARYL) 4 MG tablet Take 1 tablet (4 mg total) by mouth daily with breakfast. (Patient not taking: Reported on 09/10/2016) 30 tablet 3   No current facility-administered medications for this visit.     PHYSICAL EXAMINATION: ECOG PERFORMANCE STATUS: 2 - Symptomatic, <50% confined to bed  BP 101/69 (BP Location: Right Arm, Patient Position: Sitting)   Pulse (!) 110   Temp 98.2 F (36.8 C) (Tympanic)   Resp 20   Ht 5\' 8"  (1.727 m)   Wt 198 lb (89.8 kg)   BMI 30.11 kg/m   Filed Weights   09/10/16 1451   Weight: 198 lb (89.8 kg)    GENERAL: Well-nourished well-developed; Alert, no distress and comfortable. Patient is in a wheelchair. Accompanied by her sister; wife EYES: no pallor or icterus OROPHARYNX: no thrush or ulceration; poor dentition  NECK: supple, no masses felt LYMPH:  no palpable lymphadenopathy in the cervical, axillary or inguinal regions LUNGS: clear to auscultation and  No wheeze or crackles HEART/CVS: regular rate & rhythm and no murmurs; No lower extremity edema ABDOMEN:abdomen soft, non-tender and normal bowel sounds Musculoskeletal:no cyanosis of digits and no clubbing  PSYCH: alert & oriented x 3 with fluent speech NEURO: no focal motor/sensory deficits SKIN:  no  rashes or significant lesions  LABORATORY DATA:  I have reviewed the data as listed    Component Value Date/Time   NA 133 (L) 09/10/2016 1405   K 4.0 09/10/2016 1405   CL 104 09/10/2016 1405   CO2 15 (L) 09/10/2016 1405   GLUCOSE 140 (H) 09/10/2016 1405   BUN 12 09/10/2016 1405   CREATININE 1.40 (H) 09/10/2016 1405   CALCIUM 8.4 (L) 09/10/2016 1405   PROT 7.2 09/10/2016 1405   ALBUMIN 3.0 (L) 09/10/2016 1405   AST 29 09/10/2016 1405   ALT 21 09/10/2016 1405   ALKPHOS 186 (H) 09/10/2016 1405   BILITOT 1.2 09/10/2016 1405   GFRNONAA 53 (L) 09/10/2016 1405   GFRAA >60 09/10/2016 1405    No results found for: SPEP, UPEP  Lab Results  Component Value Date   WBC 7.0 09/10/2016   NEUTROABS 5.4 09/10/2016   HGB 8.3 (L) 09/10/2016   HCT 24.2 (L) 09/10/2016   MCV 82.3 09/10/2016   PLT 359 09/10/2016      Chemistry      Component Value Date/Time   NA 133 (L) 09/10/2016 1405   K 4.0 09/10/2016 1405   CL 104 09/10/2016 1405   CO2 15 (L) 09/10/2016 1405   BUN 12 09/10/2016 1405   CREATININE 1.40 (H) 09/10/2016 1405      Component Value Date/Time   CALCIUM 8.4 (L) 09/10/2016 1405   ALKPHOS 186 (H) 09/10/2016 1405   AST 29 09/10/2016 1405   ALT 21 09/10/2016 1405   BILITOT 1.2  09/10/2016 1405     Results for ADREYAN, CARBAJAL (MRN [id number]) as of 06/25/2016 10:29  Ref. Range 10/24/2015 13:00 12/02/2015 14:40 03/04/2016 09:00 05/28/2016 11:20 06/18/2016 10:28  PSA Latest Ref Range: 0.00 - 4.00 ng/mL 24.20 (H) 43.69 (H) 68.03 (H) 262.00 (H) 355.00 (H)   IMPRESSION: 1. Multiple areas of relative increased attenuation identified in the right lobe of the liver on the previous study are not evident today. These were felt to be likely related to benign etiology previously and slight difference in bolus timing could account for lack of visualization today. 2. Stable appearance of the index lymph nodes identified previously in the abdomen and pelvis. No new or progressive lymphadenopathy on today's study. 3. Fairly widespread sclerotic bony metastases with apparent slight progression in the bony pelvis with relative stability of lesions identified in the thoracolumbar spine. 4. Interval stability of multiple lesions identified in the inferior kidneys bilaterally. Some of these have attenuation higher than would be expected for a simple cyst and while likely cysts complicated by proteinaceous debris or hemorrhage, attention on follow-up recommended. 5.  Abdominal Aortic Atherosclerois (ICD10-170.0)   Electronically Signed   By: Misty Stanley M.D.   On: 06/21/2016 13:59 ------------------------------------------------------------------------------ IMPRESSION: Progression of diffuse osseous metastatic disease as detailed above.   Electronically Signed   By: Genia Del M.D.   On: 06/21/2016 15:33 RADIOGRAPHIC STUDIES:   ASSESSMENT & PLAN:   Malignant neoplasm of prostate ([place]) # METASTATIC CASTRATE RESISTANT PROSTATE CANCER- Completed Taxotere palliative chemotherapy on 04/01/16 x 6 cycles; FEB 2018- Unfortunately imaging/bone scan shows progressive disease/rapidly rising PSA ~ 700. CT scan shows no obvious visceral  metastases apart from stable  lymphadenopathy. Patient obviously symptomatic with significant worsening of the pain. Check guardian testing.   # I had a long discussion the patient and the family regarding the significant progression of the disease; and overall poor prognosis. Patient is interested in chemotherapy/treatment options.   # I  would recommend cabazitaxel  [20mg /m2] every 3 weeks. Discussed the potential side effects including but not limited to-increasing fatigue, nausea vomiting, diarrhea, hair loss, sores in the mouth, increase risk of infection and also neuropathy. Discussed in specific regarding diarrhea; and prevention with Lomotil/Imodium. Prescription for Lomotil given.   # Growth factor-Neulasta/On pro would be given as prophylaxis for chemotherapy-induced neutropenia to prevent febrile neutropenias.   # Reviewed/counselled regarding the goals of care- being palliative/treatment are usually indefinite-until progression or side effects. Goal is to maintain quality of life as the disease is incurable.  # Anemia hemoglobin of 8.3 at baseline [question secondary to CKD/bone marrow involvement by prostate cancer]. Recommend monitoring closely. Plan possible blood transfusion approximately 10 days post chemotherapy.   # CKD/creatinine worsening creatinine 1.6 most recently- recent CT scan in February no hydronephrosis. Monitor for now. If worse recommend ultrasound of the kidneys.   # Patient's family understands given his borderline performance status; comorbidities- high risk of adverse events of chemotherapy. Patient understands the risks involved- wants to proceed with chemotherapy. Unfortunately patient is a poor prognosis; he and his family understand it.   # Lupron q 61m- last on March 16th 2018; proceed with X-geva [4/27].   # chronic pain- sec to cancer- progressive- recommend fenatnyl 75 mcg; with morphine prn. New script given.   # Start chemotherapy next week; follow-up with me in 10 days post  chemotherapy; labs possible hold tube IV fluids.   # I reviewed the blood work- with the patient in detail; also reviewed the imaging independently [as summarized above]; and with the patient in detail.     Cammie Sickle, MD 09/11/2016 10:00 AM

## 2016-09-10 NOTE — Progress Notes (Signed)
START ON PATHWAY REGIMEN - Prostate     A cycle is every 21 days.:     Cabazitaxel      Prednisone   **Always confirm dose/schedule in your pharmacy ordering system**    Patient Characteristics: Adenocarcinoma, Metastatic, Castration Resistant, Symptomatic, Prior Docetaxel/Docetaxel Ineligible Current radiographic evidence of distant metastasis? Yes Histology: Adenocarcinoma AJCC T Category: cT2a Gleason Primary: X AJCC N Category: N1 Gleason Secondary: X AJCC M Category: M1b Gleason Score: 8 AJCC 8 Stage Grouping: IVB PSA Values (ng/mL): >= 20 Would you be surprised if this patient died  in the next year? I would be surprised if this patient died in the next year  Intent of Therapy: Non-Curative / Palliative Intent, Discussed with Patient

## 2016-09-10 NOTE — Progress Notes (Signed)
Patient here for follow-up for metastatic prostate cancer.   Patient's main concern today is related to his pain management. Pt increasing his fentaynl patches "decided to apply to patches to his arm last week of the 25 mcg dosing." pt states that he is also doubling up on his morphine 15 mg. States that he taking  Morphine 15 mg tablets 2 tablets every 8 hrs.

## 2016-09-11 MED ORDER — DIPHENOXYLATE-ATROPINE 2.5-0.025 MG PO TABS: 1.0000 | ORAL_TABLET | Freq: Four times a day (QID) | ORAL | 0 refills | Status: DC | PRN

## 2016-09-14 ENCOUNTER — Telehealth: Payer: Self-pay | Admitting: *Deleted

## 2016-09-14 ENCOUNTER — Inpatient Hospital Stay
Admission: EM | Admit: 2016-09-14 | Discharge: 2016-09-17 | DRG: 377 | Disposition: A | Discharge status: Hospice | Payer: BLUE CROSS/BLUE SHIELD | Attending: Internal Medicine | Admitting: Internal Medicine

## 2016-09-14 ENCOUNTER — Telehealth: Payer: Self-pay | Admitting: Internal Medicine

## 2016-09-14 ENCOUNTER — Inpatient Hospital Stay: Payer: BLUE CROSS/BLUE SHIELD | Attending: Internal Medicine

## 2016-09-14 ENCOUNTER — Encounter: Payer: Self-pay | Admitting: Medical Oncology

## 2016-09-14 VITALS — BP 89/44 | HR 68 | Temp 96.9°F

## 2016-09-14 DIAGNOSIS — R569 Unspecified convulsions: Secondary | ICD-10-CM | POA: Diagnosis not present

## 2016-09-14 DIAGNOSIS — K640 First degree hemorrhoids: Secondary | ICD-10-CM | POA: Diagnosis not present

## 2016-09-14 DIAGNOSIS — N179 Acute kidney failure, unspecified: Secondary | ICD-10-CM | POA: Diagnosis present

## 2016-09-14 DIAGNOSIS — Z515 Encounter for palliative care: Secondary | ICD-10-CM | POA: Diagnosis not present

## 2016-09-14 DIAGNOSIS — I252 Old myocardial infarction: Secondary | ICD-10-CM

## 2016-09-14 DIAGNOSIS — R630 Anorexia: Secondary | ICD-10-CM | POA: Diagnosis not present

## 2016-09-14 DIAGNOSIS — Z7689 Persons encountering health services in other specified circumstances: Secondary | ICD-10-CM | POA: Diagnosis not present

## 2016-09-14 DIAGNOSIS — I129 Hypertensive chronic kidney disease with stage 1 through stage 4 chronic kidney disease, or unspecified chronic kidney disease: Secondary | ICD-10-CM | POA: Diagnosis present

## 2016-09-14 DIAGNOSIS — F4321 Adjustment disorder with depressed mood: Secondary | ICD-10-CM

## 2016-09-14 DIAGNOSIS — D649 Anemia, unspecified: Secondary | ICD-10-CM | POA: Diagnosis present

## 2016-09-14 DIAGNOSIS — C61 Malignant neoplasm of prostate: Secondary | ICD-10-CM | POA: Diagnosis present

## 2016-09-14 DIAGNOSIS — N289 Disorder of kidney and ureter, unspecified: Secondary | ICD-10-CM

## 2016-09-14 DIAGNOSIS — K922 Gastrointestinal hemorrhage, unspecified: Secondary | ICD-10-CM

## 2016-09-14 DIAGNOSIS — F418 Other specified anxiety disorders: Secondary | ICD-10-CM | POA: Insufficient documentation

## 2016-09-14 DIAGNOSIS — F329 Major depressive disorder, single episode, unspecified: Secondary | ICD-10-CM | POA: Diagnosis not present

## 2016-09-14 DIAGNOSIS — F419 Anxiety disorder, unspecified: Secondary | ICD-10-CM | POA: Diagnosis present

## 2016-09-14 DIAGNOSIS — J9621 Acute and chronic respiratory failure with hypoxia: Secondary | ICD-10-CM

## 2016-09-14 DIAGNOSIS — R578 Other shock: Secondary | ICD-10-CM | POA: Diagnosis not present

## 2016-09-14 DIAGNOSIS — D630 Anemia in neoplastic disease: Secondary | ICD-10-CM | POA: Diagnosis present

## 2016-09-14 DIAGNOSIS — E669 Obesity, unspecified: Secondary | ICD-10-CM | POA: Diagnosis present

## 2016-09-14 DIAGNOSIS — Z6827 Body mass index (BMI) 27.0-27.9, adult: Secondary | ICD-10-CM

## 2016-09-14 DIAGNOSIS — Z888 Allergy status to other drugs, medicaments and biological substances status: Secondary | ICD-10-CM

## 2016-09-14 DIAGNOSIS — Z923 Personal history of irradiation: Secondary | ICD-10-CM

## 2016-09-14 DIAGNOSIS — E1165 Type 2 diabetes mellitus with hyperglycemia: Secondary | ICD-10-CM | POA: Insufficient documentation

## 2016-09-14 DIAGNOSIS — I9589 Other hypotension: Secondary | ICD-10-CM | POA: Diagnosis present

## 2016-09-14 DIAGNOSIS — C7951 Secondary malignant neoplasm of bone: Secondary | ICD-10-CM | POA: Insufficient documentation

## 2016-09-14 DIAGNOSIS — I700 Atherosclerosis of aorta: Secondary | ICD-10-CM | POA: Diagnosis not present

## 2016-09-14 DIAGNOSIS — G894 Chronic pain syndrome: Secondary | ICD-10-CM | POA: Diagnosis present

## 2016-09-14 DIAGNOSIS — Z7984 Long term (current) use of oral hypoglycemic drugs: Secondary | ICD-10-CM | POA: Diagnosis not present

## 2016-09-14 DIAGNOSIS — N189 Chronic kidney disease, unspecified: Secondary | ICD-10-CM

## 2016-09-14 DIAGNOSIS — F17210 Nicotine dependence, cigarettes, uncomplicated: Secondary | ICD-10-CM | POA: Insufficient documentation

## 2016-09-14 DIAGNOSIS — D62 Acute posthemorrhagic anemia: Secondary | ICD-10-CM | POA: Diagnosis present

## 2016-09-14 DIAGNOSIS — N183 Chronic kidney disease, stage 3 (moderate): Secondary | ICD-10-CM | POA: Insufficient documentation

## 2016-09-14 DIAGNOSIS — Z7189 Other specified counseling: Secondary | ICD-10-CM | POA: Diagnosis not present

## 2016-09-14 DIAGNOSIS — C771 Secondary and unspecified malignant neoplasm of intrathoracic lymph nodes: Secondary | ICD-10-CM | POA: Insufficient documentation

## 2016-09-14 DIAGNOSIS — Z79899 Other long term (current) drug therapy: Secondary | ICD-10-CM | POA: Insufficient documentation

## 2016-09-14 DIAGNOSIS — I959 Hypotension, unspecified: Secondary | ICD-10-CM

## 2016-09-14 DIAGNOSIS — N182 Chronic kidney disease, stage 2 (mild): Secondary | ICD-10-CM | POA: Diagnosis present

## 2016-09-14 DIAGNOSIS — E43 Unspecified severe protein-calorie malnutrition: Secondary | ICD-10-CM | POA: Diagnosis present

## 2016-09-14 DIAGNOSIS — J9589 Other postprocedural complications and disorders of respiratory system, not elsewhere classified: Secondary | ICD-10-CM | POA: Diagnosis not present

## 2016-09-14 DIAGNOSIS — R634 Abnormal weight loss: Secondary | ICD-10-CM | POA: Insufficient documentation

## 2016-09-14 DIAGNOSIS — E785 Hyperlipidemia, unspecified: Secondary | ICD-10-CM | POA: Insufficient documentation

## 2016-09-14 DIAGNOSIS — Z5111 Encounter for antineoplastic chemotherapy: Secondary | ICD-10-CM | POA: Insufficient documentation

## 2016-09-14 DIAGNOSIS — Y838 Other surgical procedures as the cause of abnormal reaction of the patient, or of later complication, without mention of misadventure at the time of the procedure: Secondary | ICD-10-CM | POA: Diagnosis present

## 2016-09-14 DIAGNOSIS — E1122 Type 2 diabetes mellitus with diabetic chronic kidney disease: Secondary | ICD-10-CM | POA: Diagnosis present

## 2016-09-14 DIAGNOSIS — Z96641 Presence of right artificial hip joint: Secondary | ICD-10-CM | POA: Diagnosis present

## 2016-09-14 DIAGNOSIS — D122 Benign neoplasm of ascending colon: Secondary | ICD-10-CM | POA: Diagnosis present

## 2016-09-14 DIAGNOSIS — K921 Melena: Principal | ICD-10-CM | POA: Diagnosis present

## 2016-09-14 DIAGNOSIS — Z791 Long term (current) use of non-steroidal anti-inflammatories (NSAID): Secondary | ICD-10-CM

## 2016-09-14 DIAGNOSIS — G893 Neoplasm related pain (acute) (chronic): Secondary | ICD-10-CM | POA: Insufficient documentation

## 2016-09-14 DIAGNOSIS — R4589 Other symptoms and signs involving emotional state: Secondary | ICD-10-CM

## 2016-09-14 DIAGNOSIS — J690 Pneumonitis due to inhalation of food and vomit: Secondary | ICD-10-CM

## 2016-09-14 DIAGNOSIS — E871 Hypo-osmolality and hyponatremia: Secondary | ICD-10-CM | POA: Diagnosis present

## 2016-09-14 DIAGNOSIS — R5383 Other fatigue: Secondary | ICD-10-CM | POA: Diagnosis not present

## 2016-09-14 DIAGNOSIS — J9601 Acute respiratory failure with hypoxia: Secondary | ICD-10-CM | POA: Diagnosis not present

## 2016-09-14 DIAGNOSIS — Z9889 Other specified postprocedural states: Secondary | ICD-10-CM

## 2016-09-14 DIAGNOSIS — C779 Secondary and unspecified malignant neoplasm of lymph node, unspecified: Secondary | ICD-10-CM | POA: Diagnosis not present

## 2016-09-14 DIAGNOSIS — Z79891 Long term (current) use of opiate analgesic: Secondary | ICD-10-CM

## 2016-09-14 DIAGNOSIS — Z8042 Family history of malignant neoplasm of prostate: Secondary | ICD-10-CM

## 2016-09-14 DIAGNOSIS — R531 Weakness: Secondary | ICD-10-CM | POA: Diagnosis not present

## 2016-09-14 DIAGNOSIS — R0602 Shortness of breath: Secondary | ICD-10-CM | POA: Diagnosis not present

## 2016-09-14 DIAGNOSIS — Z885 Allergy status to narcotic agent status: Secondary | ICD-10-CM

## 2016-09-14 DIAGNOSIS — F4320 Adjustment disorder, unspecified: Secondary | ICD-10-CM | POA: Diagnosis not present

## 2016-09-14 LAB — BASIC METABOLIC PANEL
Anion gap: 10 (ref 5–15)
BUN: 34 mg/dL — ABNORMAL HIGH (ref 6–20)
CALCIUM: 7 mg/dL — AB (ref 8.9–10.3)
CO2: 17 mmol/L — ABNORMAL LOW (ref 22–32)
CREATININE: 1.63 mg/dL — AB (ref 0.61–1.24)
Chloride: 105 mmol/L (ref 101–111)
GFR calc Af Amer: 51 mL/min — ABNORMAL LOW (ref >60)
GFR, EST NON AFRICAN AMERICAN: 44 mL/min — AB (ref >60)
GLUCOSE: 142 mg/dL — AB (ref 65–99)
Potassium: 3.6 mmol/L (ref 3.5–5.1)
SODIUM: 132 mmol/L — AB (ref 135–145)

## 2016-09-14 LAB — GLUCOSE, CAPILLARY
Glucose-Capillary: 234 mg/dL — ABNORMAL HIGH (ref 65–99)
Glucose-Capillary: 239 mg/dL — ABNORMAL HIGH (ref 65–99)

## 2016-09-14 LAB — CBC WITH DIFFERENTIAL/PLATELET
BASOS ABS: 0 10*3/uL (ref 0–0.1)
Basophils Relative: 1 %
EOS ABS: 0.1 10*3/uL (ref 0–0.7)
Eosinophils Relative: 2 %
HCT: 16.7 % — ABNORMAL LOW (ref 40.0–52.0)
Hemoglobin: 5.7 g/dL — ABNORMAL LOW (ref 13.0–18.0)
LYMPHS ABS: 0.6 10*3/uL — AB (ref 1.0–3.6)
Lymphocytes Relative: 13 %
MCH: 28.3 pg (ref 26.0–34.0)
MCHC: 34.3 g/dL (ref 32.0–36.0)
MCV: 82.5 fL (ref 80.0–100.0)
Monocytes Absolute: 0.5 10*3/uL (ref 0.2–1.0)
Monocytes Relative: 10 %
NEUTROS ABS: 3.4 10*3/uL (ref 1.4–6.5)
Neutrophils Relative %: 74 %
PLATELETS: 239 10*3/uL (ref 150–440)
RBC: 2.02 MIL/uL — ABNORMAL LOW (ref 4.40–5.90)
RDW: 19.4 % — AB (ref 11.5–14.5)
SMEAR REVIEW: ADEQUATE
WBC: 4.6 10*3/uL (ref 4.0–10.5)

## 2016-09-14 LAB — PREPARE RBC (CROSSMATCH)

## 2016-09-14 LAB — SAMPLE TO BLOOD BANK

## 2016-09-14 LAB — MRSA PCR SCREENING: MRSA by PCR: NEGATIVE

## 2016-09-14 MED ORDER — MORPHINE SULFATE 15 MG PO TABS
15.0000 mg | ORAL_TABLET | Freq: Four times a day (QID) | ORAL | Status: DC | PRN
  Administered 2016-09-15 – 2016-09-16 (×2): 15 mg via ORAL
  Filled 2016-09-14 (×3): qty 1

## 2016-09-14 MED ORDER — SODIUM CHLORIDE 0.9% FLUSH
3.0000 mL | Freq: Two times a day (BID) | INTRAVENOUS | Status: DC
  Administered 2016-09-14 – 2016-09-17 (×6): 3 mL via INTRAVENOUS

## 2016-09-14 MED ORDER — SODIUM CHLORIDE 0.9 % IV SOLN: 10.0000 mg | Freq: Once | INTRAVENOUS | Status: DC

## 2016-09-14 MED ORDER — SODIUM CHLORIDE 0.9 % IV SOLN
8.0000 mg/h | INTRAVENOUS | Status: DC
  Administered 2016-09-14 – 2016-09-16 (×5): 8 mg/h via INTRAVENOUS
  Filled 2016-09-14 (×5): qty 80

## 2016-09-14 MED ORDER — FENTANYL 75 MCG/HR TD PT72
75.0000 ug | MEDICATED_PATCH | TRANSDERMAL | Status: DC
  Administered 2016-09-14: 75 ug via TRANSDERMAL
  Filled 2016-09-14: qty 1

## 2016-09-14 MED ORDER — SODIUM CHLORIDE 0.9 % IV SOLN
Freq: Once | INTRAVENOUS | Status: AC
  Administered 2016-09-16: 12:00:00 via INTRAVENOUS

## 2016-09-14 MED ORDER — ONDANSETRON HCL 4 MG PO TABS
4.0000 mg | ORAL_TABLET | Freq: Four times a day (QID) | ORAL | Status: DC | PRN
  Filled 2016-09-14: qty 1

## 2016-09-14 MED ORDER — ACETAMINOPHEN 325 MG PO TABS: 650.0000 mg | ORAL_TABLET | Freq: Four times a day (QID) | ORAL | Status: DC | PRN

## 2016-09-14 MED ORDER — SODIUM CHLORIDE 0.9 % IV SOLN
80.0000 mg | Freq: Once | INTRAVENOUS | Status: AC
  Administered 2016-09-14: 16:00:00 80 mg via INTRAVENOUS
  Filled 2016-09-14: qty 80

## 2016-09-14 MED ORDER — SODIUM CHLORIDE 0.9 % IV SOLN
INTRAVENOUS | Status: DC
  Administered 2016-09-14: 12:00:00 via INTRAVENOUS
  Filled 2016-09-14 (×2): qty 1000

## 2016-09-14 MED ORDER — DIPHENHYDRAMINE HCL 50 MG/ML IJ SOLN
25.0000 mg | Freq: Once | INTRAMUSCULAR | Status: AC
  Administered 2016-09-14: 25 mg via INTRAVENOUS
  Filled 2016-09-14: qty 1

## 2016-09-14 MED ORDER — PEGFILGRASTIM 6 MG/0.6ML ~~LOC~~ PSKT: 6.0000 mg | PREFILLED_SYRINGE | Freq: Once | SUBCUTANEOUS | Status: DC

## 2016-09-14 MED ORDER — OXYCODONE HCL 5 MG PO TABS
5.0000 mg | ORAL_TABLET | Freq: Once | ORAL | Status: AC
  Administered 2016-09-14: 5 mg via ORAL
  Filled 2016-09-14: qty 1

## 2016-09-14 MED ORDER — ONDANSETRON HCL 4 MG/2ML IJ SOLN: 4.0000 mg | Freq: Four times a day (QID) | INTRAMUSCULAR | Status: DC | PRN

## 2016-09-14 MED ORDER — PANTOPRAZOLE SODIUM 40 MG IV SOLR: 40.0000 mg | Freq: Two times a day (BID) | INTRAVENOUS | Status: DC

## 2016-09-14 MED ORDER — ONDANSETRON HCL 4 MG/2ML IJ SOLN
4.0000 mg | Freq: Once | INTRAMUSCULAR | Status: AC
  Administered 2016-09-14: 4 mg via INTRAVENOUS
  Filled 2016-09-14: qty 2

## 2016-09-14 MED ORDER — ACETAMINOPHEN 650 MG RE SUPP: 650.0000 mg | Freq: Four times a day (QID) | RECTAL | Status: DC | PRN

## 2016-09-14 MED ORDER — SODIUM CHLORIDE 0.9 % IV SOLN
10.0000 mL/h | Freq: Once | INTRAVENOUS | Status: AC
  Administered 2016-09-14: 10 mL/h via INTRAVENOUS

## 2016-09-14 MED ORDER — DEXTROSE 5 % IV SOLN
20.0000 mg/m2 | Freq: Once | INTRAVENOUS | Status: DC
  Filled 2016-09-14: qty 4.2

## 2016-09-14 MED ORDER — TAMSULOSIN HCL 0.4 MG PO CAPS
0.4000 mg | ORAL_CAPSULE | Freq: Every day | ORAL | Status: DC
  Administered 2016-09-14 – 2016-09-16 (×3): 0.4 mg via ORAL
  Filled 2016-09-14 (×3): qty 1

## 2016-09-14 MED ORDER — ALBUTEROL SULFATE (2.5 MG/3ML) 0.083% IN NEBU
2.5000 mg | INHALATION_SOLUTION | RESPIRATORY_TRACT | Status: DC | PRN
Start: 2016-09-14 — End: 2016-09-17

## 2016-09-14 MED ORDER — FAMOTIDINE IN NACL 20-0.9 MG/50ML-% IV SOLN
20.0000 mg | Freq: Once | INTRAVENOUS | Status: AC
  Administered 2016-09-14: 20 mg via INTRAVENOUS
  Filled 2016-09-14: qty 50

## 2016-09-14 MED ORDER — POLYETHYLENE GLYCOL 3350 17 G PO PACK: 17.0000 g | PACK | Freq: Every day | ORAL | Status: DC | PRN

## 2016-09-14 MED ORDER — HYDROCODONE-ACETAMINOPHEN 5-325 MG PO TABS: 1.0000 | ORAL_TABLET | ORAL | Status: DC | PRN

## 2016-09-14 MED ORDER — INSULIN ASPART 100 UNIT/ML ~~LOC~~ SOLN
0.0000 [IU] | Freq: Three times a day (TID) | SUBCUTANEOUS | Status: DC
  Administered 2016-09-15 (×2): 2 [IU] via SUBCUTANEOUS
  Filled 2016-09-14 (×2): qty 2

## 2016-09-14 MED ORDER — POTASSIUM CHLORIDE IN NACL 20-0.9 MEQ/L-% IV SOLN
INTRAVENOUS | Status: DC
  Administered 2016-09-14: 20:00:00 via INTRAVENOUS
  Filled 2016-09-14 (×3): qty 1000

## 2016-09-14 MED ORDER — SODIUM CHLORIDE 0.9 % IV BOLUS (SEPSIS)
500.0000 mL | Freq: Once | INTRAVENOUS | Status: AC
  Administered 2016-09-14: 500 mL via INTRAVENOUS

## 2016-09-14 MED ORDER — DEXAMETHASONE SODIUM PHOSPHATE 10 MG/ML IJ SOLN
10.0000 mg | Freq: Once | INTRAMUSCULAR | Status: AC
  Administered 2016-09-14: 10 mg via INTRAVENOUS
  Filled 2016-09-14: qty 1

## 2016-09-14 MED ORDER — SODIUM CHLORIDE 0.9 % IV SOLN
Freq: Once | INTRAVENOUS | Status: AC
  Administered 2016-09-14: 13:00:00 via INTRAVENOUS
  Filled 2016-09-14: qty 1000

## 2016-09-14 MED ORDER — ALPRAZOLAM 0.5 MG PO TABS
0.5000 mg | ORAL_TABLET | Freq: Three times a day (TID) | ORAL | Status: DC | PRN
  Administered 2016-09-16 (×2): 0.5 mg via ORAL
  Filled 2016-09-14 (×2): qty 1

## 2016-09-14 NOTE — H&P (Signed)
[facility] [place] NAME: Sean Petersen    MR#:  [id number]  DATE OF BIRTH:  [birth year]  DATE OF ADMISSION:  09/14/2016  PRIMARY CARE PHYSICIAN: Tracie Harrier, MD   REQUESTING/REFERRING PHYSICIAN: Dr. Archie Balboa  CHIEF COMPLAINT:   Chief Complaint  Patient presents with  . Anemia    HISTORY OF PRESENT ILLNESS:  Sean Petersen  is a 61 y.o. male with a known history of Hypertension, prostate cancer with wide metastasis, chronic pain presents to the emergency room from cancer center after he was noticed to be hypotensive with systolic of 08X. Anemia with hemoglobin of 5.7. Patient here has systolic in the 44Y. He has noticed on and off bleeding with his stools over past few days which can range from streaks of blood to significant amount. Last bowel movement with blood was 2 days back. No melena. He does take NSAIDs with meloxicam. No recent blood transfusions. Undergoing chemotherapy at cancer center.  PAST MEDICAL HISTORY:   Past Medical History:  Diagnosis Date  . Anxiety   . Cancer [facility])    states he had cancer in his left forearm but is unaware of type and was told his body took care of it  . Chronic kidney disease    stage 3  . Depression   . Essential hypertension, benign   . Family history of adverse reaction to anesthesia    adopted  . Myocardial infarction ([place])   . Obesity   . Other and unspecified hyperlipidemia   . PONV (postoperative nausea and vomiting)    nausea  . Prostate cancer ([place])   . Type II or unspecified type diabetes mellitus without mention of complication, uncontrolled    Metformin  . Varicosities     PAST SURGICAL HISTORY:   Past Surgical History:  Procedure Laterality Date  . CARDIAC CATHETERIZATION    . COLONOSCOPY  2013  . COLONOSCOPY WITH PROPOFOL N/A 01/23/2015   Procedure: COLONOSCOPY WITH PROPOFOL;  Surgeon: Hulen Luster, MD;  Location: [facility] ENDOSCOPY;  Service: Gastroenterology;  Laterality: N/A;   . INTRAOPERATIVE ARTERIOGRAM     for vascular disease  . TONSILLECTOMY    . TOTAL HIP ARTHROPLASTY Right 05/01/2015   Procedure: TOTAL HIP ARTHROPLASTY ANTERIOR APPROACH;  Surgeon: Hessie Knows, MD;  Location: [facility] ORS;  Service: Orthopedics;  Laterality: Right;    SOCIAL HISTORY:   Social History  Substance Use Topics  . Smoking status: Current Every Day Smoker    Packs/day: 0.50    Types: Cigarettes  . Smokeless tobacco: Never Used  . Alcohol use 0.0 oz/week     Comment: 28 12 ounce beers per week    FAMILY HISTORY:   Family History  Problem Relation Age of Onset  . Adopted: Yes    DRUG ALLERGIES:   Allergies  Allergen Reactions  . Codeine Sulfate Nausea And Vomiting  . Prednisone Nausea And Vomiting    REVIEW OF SYSTEMS:   Review of Systems  Constitutional: Positive for malaise/fatigue. Negative for chills, fever and weight loss.  HENT: Negative for hearing loss and nosebleeds.   Eyes: Negative for blurred vision, double vision and pain.  Respiratory: Negative for cough, hemoptysis, sputum production, shortness of breath and wheezing.   Cardiovascular: Negative for chest pain, palpitations, orthopnea and leg swelling.  Gastrointestinal: Positive for blood in stool. Negative for abdominal pain, constipation, diarrhea, nausea and vomiting.  Genitourinary: Negative for dysuria and hematuria.  Musculoskeletal: Positive for back pain and joint pain.  Negative for falls and myalgias.  Skin: Negative for rash.  Neurological: Positive for dizziness and weakness. Negative for tremors, sensory change, speech change, focal weakness, seizures and headaches.  Endo/Heme/Allergies: Does not bruise/bleed easily.  Psychiatric/Behavioral: Negative for depression and memory loss. The patient is not nervous/anxious.     MEDICATIONS AT HOME:   Prior to Admission medications   Medication Sig Start Date End Date Taking? Authorizing Provider  ALPRAZolam Duanne Moron) 0.5 MG tablet Take  0.5 mg by mouth 2 (two) times daily as needed for sleep.  08/01/15  Yes Historical Provider, MD  amLODipine (NORVASC) 10 MG tablet Take 10 mg by mouth daily.   Yes Historical Provider, MD  atenolol (TENORMIN) 100 MG tablet Take 100 mg by mouth daily.   Yes Historical Provider, MD  diphenoxylate-atropine (LOMOTIL) 2.5-0.025 MG tablet Take 1 tablet by mouth 4 (four) times daily as needed for diarrhea or loose stools. Take it along with immodium 09/11/16  Yes Cammie Sickle, MD  fentaNYL (DURAGESIC - DOSED MCG/HR) 75 MCG/HR Place 1 patch (75 mcg total) onto the skin every 3 (three) days. 09/10/16  Yes Cammie Sickle, MD  isosorbide mononitrate (IMDUR) 30 MG 24 hr tablet Take 30 mg by mouth daily.   Yes Historical Provider, MD  lisinopril (PRINIVIL,ZESTRIL) 20 MG tablet Take 20 mg by mouth daily.  12/01/12  Yes Historical Provider, MD  metFORMIN (GLUCOPHAGE) 1000 MG tablet Take 1,000 mg by mouth daily with breakfast.  03/10/15  Yes Historical Provider, MD  morphine (MSIR) 15 MG tablet Take 1 tablet (15 mg total) by mouth every 6 (six) hours as needed for severe pain. 09/10/16  Yes Cammie Sickle, MD  naproxen (NAPROSYN) 500 MG tablet Take 1 tablet (500 mg total) by mouth 2 (two) times daily with a meal. 05/14/16  Yes Carrie Mew, MD  simvastatin (ZOCOR) 40 MG tablet Take 40 mg by mouth every evening.   Yes Historical Provider, MD  tamsulosin (FLOMAX) 0.4 MG CAPS capsule Take 1 capsule (0.4 mg total) by mouth daily after supper. 10/24/15  Yes Noreene Filbert, MD  fentaNYL (DURAGESIC - DOSED MCG/HR) 25 MCG/HR patch Place 1 patch (25 mcg total) onto the skin every 3 (three) days. Patient not taking: Reported on 09/14/2016 06/25/16   Cammie Sickle, MD  glimepiride (AMARYL) 4 MG tablet Take 1 tablet (4 mg total) by mouth daily with breakfast. Patient not taking: Reported on 09/10/2016 01/29/16 01/28/17  Cammie Sickle, MD     VITAL SIGNS:  Blood pressure (!) 90/49, pulse 65,  temperature 98.5 F (36.9 C), temperature source Oral, resp. rate 11, height 5\' 8"  (1.727 m), weight 74.8 kg (165 lb), SpO2 93 %.  PHYSICAL EXAMINATION:  Physical Exam  GENERAL:  61 y.o.-year-old patient lying in the bed with no acute distress. Looks pale EYES: Pupils equal, round, reactive to light and accommodation. No scleral icterus. Extraocular muscles intact.  HEENT: Head atraumatic, normocephalic. Oropharynx and nasopharynx clear. No oropharyngeal erythema, moist oral mucosa  NECK:  Supple, no jugular venous distention. No thyroid enlargement, no tenderness.  LUNGS: Normal breath sounds bilaterally, no wheezing, rales, rhonchi. No use of accessory muscles of respiration.  CARDIOVASCULAR: S1, S2 normal. No murmurs, rubs, or gallops.  ABDOMEN: Soft, nontender, nondistended. Bowel sounds present. No organomegaly or mass.  EXTREMITIES: No pedal edema, cyanosis, or clubbing. + 2 pedal & radial pulses b/l.   NEUROLOGIC: Cranial nerves II through XII are intact. No focal Motor or sensory deficits appreciated b/l PSYCHIATRIC: The  patient is alert and oriented x 3. Good affect.  SKIN: No obvious rash, lesion, or ulcer.   LABORATORY PANEL:   CBC  Recent Labs Lab 09/14/16 1409  WBC 4.6  HGB 5.7*  HCT 16.7*  PLT 239   ------------------------------------------------------------------------------------------------------------------  Chemistries   Recent Labs Lab 09/10/16 1405 09/14/16 1409  NA 133* 132*  K 4.0 3.6  CL 104 105  CO2 15* 17*  GLUCOSE 140* 142*  BUN 12 34*  CREATININE 1.40* 1.63*  CALCIUM 8.4* 7.0*  AST 29  --   ALT 21  --   ALKPHOS 186*  --   BILITOT 1.2  --    ------------------------------------------------------------------------------------------------------------------  Cardiac Enzymes No results for input(s): TROPONINI in the last 168  hours. ------------------------------------------------------------------------------------------------------------------  RADIOLOGY:  No results found.   IMPRESSION AND PLAN:   * GI bleed with Acute blood loss anemia Patient has been seeing on and off blood in his stool. Last episode was 2 days back. This can be from minimal streaking to large amounts of blood. No melena. Does not take any anticoagulants. Will admit the patient and transfuse 3 units packed RBC. Due to associated hypotension he will need to be admitted to stepdown unit. Bolus normal saline stat. Critically ill.  * Prostate cancer with wide metastasis. Follows with Dr. Rogue Bussing at the cancer center.  * Hypertension. Patient is presently hypotension. Hold his blood pressure medications. His last dose was yesterday night.  * DVT prophylaxis with SCDs  All the records are reviewed and case discussed with ED provider. Management plans discussed with the patient, family and they are in agreement.  CODE STATUS: FULL CODE  TOTAL CC TIME TAKING CARE OF THIS PATIENT: 40 minutes.   Hillary Bow R M.D on 09/14/2016 at 6:15 PM  Between 7am to 6pm - Pager - [phone]  After 6pm go to [email or web address] - password [email or web address] [facility]  Office  [phone]  CC: Primary care physician; Tracie Harrier, MD  Note: This dictation was prepared with Dragon dictation along with smaller phrase technology. Any transcriptional errors that result from this process are unintentional.

## 2016-09-14 NOTE — Progress Notes (Signed)
V/o obtained for stat cbc, metb, hold tube due to hypotension. Per md- hold chair for 2 units of blood tomorrow and r/s jevtana for this Thursday 5/3. Spoke with scheduling to arrange for these new appointments.

## 2016-09-14 NOTE — ED Notes (Addendum)
Pt. sister stated that his Hgb was around 5 and that her husband has prostate/bone cancer. Was referred here by cancer center.

## 2016-09-14 NOTE — ED Triage Notes (Signed)
Pt from cancer center with reports of HGB of 5. Pt reports years of GI bleeding. Denies sob.

## 2016-09-14 NOTE — Progress Notes (Signed)
Mr. Tallman came to infusion today to start his first jevtana treatment. Pt vitals when he first arrived was 77/46 the 77/47. Dr. Rogue Bussing made aware of BP. Ordered to give NS bolus over one hour and to call with BP after NS infusion. Will continue to monitor.    09/15/15 1307 Spoke with Dr. Rogue Bussing regarding pt hypotension. Last BP was 89/50. Pt says that he is feeling a lot better. Physician ordered to still give his Christinia Gully today and to inform pt to stop his Lisinopril, amlodipine, and atentolol. Will continue to monitor.   09/14/16 1500 Pt has become more pale and lightheaded. BP still low 84/42. Jevtana not given today. Plan to give treatment in 2 days per Dr. Tish Men. Ordered to check CBC and Metc. Hbg back at 5.7. Pt sent to ED in wheelchair to receive blood.

## 2016-09-14 NOTE — Telephone Encounter (Signed)
Pt's blood improved to 81/53 after 1 L fluid. Recommend stopping amlodipine; atenolol; lisinopril. Proceed with chemotherapy today. Recheck blood pressure- if systolic greater than 80- okay to send the patient home.

## 2016-09-14 NOTE — ED Provider Notes (Signed)
[facility] Emergency Department Provider Note  ____________________________________________   I have reviewed the triage vital signs and the nursing notes.   HISTORY  Chief Complaint Anemia   History limited by: Not Limited   HPI Sean Petersen is a 61 y.o. male who presents to the emergency department today because of anemia. The patient was found to be severely anemic at the oncology infusion clinic today. He had been scheduled as versus chemotherapy. While there he was noted to be hypotensive. Hemoglobin was checked and was found to be below 6. Patient states he has a known history of GI bleed of unknown source. He denies requiring transfusions or having anemia in the past. Per chart review however his hemoglobin has been trending down. Patient states that he has been dizzy and weak however is unsure if that is due to the cancer.    Past Medical History:  Diagnosis Date  . Anxiety   . Cancer [facility])    states he had cancer in his left forearm but is unaware of type and was told his body took care of it  . Chronic kidney disease    stage 3  . Depression   . Essential hypertension, benign   . Family history of adverse reaction to anesthesia    adopted  . Myocardial infarction ([place])   . Obesity   . Other and unspecified hyperlipidemia   . PONV (postoperative nausea and vomiting)    nausea  . Prostate cancer ([place])   . Type II or unspecified type diabetes mellitus without mention of complication, uncontrolled    Metformin  . Varicosities     Patient Active Problem List   Diagnosis Date Noted  . Dehydration 01/20/2016  . Diarrhea 12/04/2015  . Anxiety 08/22/2015  . Chronic kidney disease (CKD), stage III (moderate) 08/22/2015  . BP (high blood pressure) 08/22/2015  . Current tobacco use 08/22/2015  . Type 2 diabetes mellitus ([place]) 08/22/2015  . Seizure disorder ([place]) 08/01/2015  . Tobacco use disorder, continuous 08/01/2015  . Primary  osteoarthritis of right hip 05/01/2015  . 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 CAD in native artery 05/14/2014  . Malignant neoplasm of prostate ([place]) 02/23/2013  . Myocardial infarction [facility]) 05/17/2002    Past Surgical History:  Procedure Laterality Date  . CARDIAC CATHETERIZATION    . COLONOSCOPY  2013  . COLONOSCOPY WITH PROPOFOL N/A 01/23/2015   Procedure: COLONOSCOPY WITH PROPOFOL;  Surgeon: Hulen Luster, MD;  Location: [facility] ENDOSCOPY;  Service: Gastroenterology;  Laterality: N/A;  . INTRAOPERATIVE ARTERIOGRAM     for vascular disease  . TONSILLECTOMY    . TOTAL HIP ARTHROPLASTY Right 05/01/2015   Procedure: TOTAL HIP ARTHROPLASTY ANTERIOR APPROACH;  Surgeon: Hessie Knows, MD;  Location: [facility] ORS;  Service: Orthopedics;  Laterality: Right;    Prior to Admission medications   Medication Sig Start Date End Date Taking? Authorizing Provider  ALPRAZolam Duanne Moron) 0.5 MG tablet Take 0.5 mg by mouth 2 (two) times daily as needed for sleep.  08/01/15   Historical Provider, MD  amLODipine (NORVASC) 10 MG tablet Take 10 mg by mouth daily.    Historical Provider, MD  atenolol (TENORMIN) 100 MG tablet Take 100 mg by mouth daily.    Historical Provider, MD  diphenoxylate-atropine (LOMOTIL) 2.5-0.025 MG tablet Take 1 tablet by mouth 4 (four) times daily as needed for diarrhea or loose stools. Take it along with immodium 09/11/16   Cammie Sickle, MD  fentaNYL (DURAGESIC - DOSED MCG/HR) 25 MCG/HR patch Place 1 patch (25 mcg total) onto the skin every 3 (three) days. 06/25/16   Cammie Sickle, MD  fentaNYL (DURAGESIC - DOSED MCG/HR) 75 MCG/HR Place 1 patch (75 mcg total) onto the skin every 3 (three) days. 09/10/16   Cammie Sickle, MD  glimepiride (AMARYL) 4 MG tablet Take 1 tablet (4 mg total) by mouth daily with breakfast. Patient not taking: Reported on 09/10/2016 01/29/16 01/28/17  Cammie Sickle, MD  isosorbide mononitrate (IMDUR) 30 MG 24 hr  tablet Take 30 mg by mouth daily.    Historical Provider, MD  lisinopril (PRINIVIL,ZESTRIL) 20 MG tablet Take 20 mg by mouth daily.  12/01/12   Historical Provider, MD  metFORMIN (GLUCOPHAGE) 1000 MG tablet Take 1,000 mg by mouth daily with breakfast.  03/10/15   Historical Provider, MD  morphine (MSIR) 15 MG tablet Take 1 tablet (15 mg total) by mouth every 6 (six) hours as needed for severe pain. 09/10/16   Cammie Sickle, MD  naproxen (NAPROSYN) 500 MG tablet Take 1 tablet (500 mg total) by mouth 2 (two) times daily with a meal. 05/14/16   Carrie Mew, MD  simvastatin (ZOCOR) 40 MG tablet Take 40 mg by mouth every evening.    Historical Provider, MD  tamsulosin (FLOMAX) 0.4 MG CAPS capsule Take 1 capsule (0.4 mg total) by mouth daily after supper. 10/24/15   Noreene Filbert, MD    Allergies Codeine sulfate and Prednisone  Family History  Problem Relation Age of Onset  . Adopted: Yes    Social History Social History  Substance Use Topics  . Smoking status: Current Every Day Smoker    Packs/day: 0.50    Types: Cigarettes  . Smokeless tobacco: Never Used  . Alcohol use 0.0 oz/week     Comment: 28 12 ounce beers per week    Review of Systems Constitutional: No fever/chills Eyes: No visual changes. ENT: No sore throat. Cardiovascular: Denies chest pain. Respiratory: Denies shortness of breath. Gastrointestinal: No abdominal pain.  No nausea, no vomiting.  No diarrhea.   Genitourinary: Negative for dysuria. Musculoskeletal: Negative for back pain. Skin: Negative for rash. Neurological: Negative for headaches, focal weakness or numbness.  ____________________________________________   PHYSICAL EXAM:  VITAL SIGNS: ED Triage Vitals [09/14/16 1510]  Enc Vitals Group     BP (!) 98/48     Pulse Rate 70     Resp 18     Temp 98.9 F (37.2 C)     Temp Source Oral     SpO2 98 %     Weight 198 lb (89.8 kg)     Height 5\' 8"  (1.727 m)     Head Circumference      Peak  Flow      Pain Score 0    Constitutional: Alert and oriented. Well appearing and in no distress. Eyes: Conjunctivae are normal. Normal extraocular movements. ENT   Head: Normocephalic and atraumatic.   Nose: No congestion/rhinnorhea.   Mouth/Throat: Mucous membranes are moist.   Neck: No stridor. Hematological/Lymphatic/Immunilogical: No cervical lymphadenopathy. Cardiovascular: Normal rate, regular rhythm.  No murmurs, rubs, or gallops. Respiratory: Normal respiratory effort without tachypnea nor retractions. Breath sounds are clear and equal bilaterally. No wheezes/rales/rhonchi. Gastrointestinal: Soft and non tender. No rebound. No guarding.  Genitourinary: Deferred Musculoskeletal: Normal range of motion in all extremities. No lower extremity edema. Neurologic:  Normal speech and language. No gross focal neurologic deficits are appreciated.  Skin:  Pale.  Psychiatric: Mood and affect are normal. Speech and behavior are normal. Patient exhibits appropriate insight and judgment.  ____________________________________________    LABS (pertinent positives/negatives)  Labs Reviewed  TYPE AND SCREEN  PREPARE RBC (CROSSMATCH)   Hgb 5.7  ____________________________________________   EKG  None  ____________________________________________    RADIOLOGY  None  ____________________________________________   PROCEDURES  Procedures  CRITICAL CARE Performed by: Nance Pear   Total critical care time: 20 minutes  Critical care time was exclusive of separately billable procedures and treating other patients.  Critical care was necessary to treat or prevent imminent or life-threatening deterioration.  Critical care was time spent personally by me on the following activities: development of treatment plan with patient and/or surrogate as well as nursing, discussions with consultants, evaluation of patient's response to treatment, examination of patient,  obtaining history from patient or surrogate, ordering and performing treatments and interventions, ordering and review of laboratory studies, ordering and review of radiographic studies, pulse oximetry and re-evaluation of patient's condition.  ____________________________________________   INITIAL IMPRESSION / ASSESSMENT AND PLAN / ED COURSE  Pertinent labs & imaging results that were available during my care of the patient were reviewed by me and considered in my medical decision making (see chart for details).  Patient presented to the emergency department from oncology clinic because of concerns for anemia. Hemoglobin was 5.7. Patient states he has history of chronic GI bleeding. Did discuss and consent patient for RBC transfusions. Will admit to hospitalist service.   ____________________________________________   FINAL CLINICAL IMPRESSION(S) / ED DIAGNOSES  Final diagnoses:  Anemia, unspecified type     Note: This dictation was prepared with Dragon dictation. Any transcriptional errors that result from this process are unintentional     Nance Pear, MD 09/14/16 [phone]

## 2016-09-14 NOTE — Telephone Encounter (Signed)
Patient in clinic today for chemotherapy. hgb 5.7- symptomatic anemia/hypotension/dizziness. Per v/o patient needs to be transported to ED asap for further mgmt of symptoms. Spoke with infusion nurse, Maudie Mercury, RN-who will ensure pt gets to ED.

## 2016-09-14 NOTE — Consult Note (Signed)
Name: Sean Petersen MRN: [id number] DOB: 1955-06-19    ADMISSION DATE:  09/14/2016 CONSULTATION DATE:  09/14/16  REFERRING MD : Dr. Darvin Neighbours  CHIEF COMPLAINT:  Hypotension secondary to GIB  BRIEF PATIENT DESCRIPTION: Sean Petersen is a 61 year old with known history of Prostrate Cancer with wide metastasis and GIB now presenting with Hypotension and severe anemia.  SIGNIFICANT EVENTS  5/1 Patient was admitted to the ICU with hypotension and Anemia secondary to lower GIB  STUDIES:  None   HISTORY OF PRESENT ILLNESS: Sean Petersen is a 61 year old male with known history of Hypertension, CKD,Depression,DM and prostrate cancer with wide metastasis.  Patient was presented from [facility] to ED with concerns of Hypotension and severe Anemia with Hgb of 5.7mg /dl.  In the ED his SBP was noted to be in 70'S.  He states that he has noticed bleeding from the rectum on and off.  Patient has been evaluated by the Gastroenterologist in the past.  Patient denies any use of NSAIDS or any recent blood transfusions.  PAST MEDICAL HISTORY :   has a past medical history of Anxiety; Cancer ([place]); Chronic kidney disease; Depression; Essential hypertension, benign; Family history of adverse reaction to anesthesia; Myocardial infarction ([place]); Obesity; Other and unspecified hyperlipidemia; PONV (postoperative nausea and vomiting); Prostate cancer ([place]); Type II or unspecified type diabetes mellitus without mention of complication, uncontrolled; and Varicosities.  has a past surgical history that includes Colonoscopy (2013); Intraoperative arteriogram; Colonoscopy with propofol (N/A, 01/23/2015); Tonsillectomy; Cardiac catheterization; and Total hip arthroplasty (Right, 05/01/2015). Prior to Admission medications   Medication Sig Start Date End Date Taking? Authorizing Provider  ALPRAZolam Duanne Moron) 0.5 MG tablet Take 0.5 mg by mouth 2 (two) times daily as needed for sleep.  08/01/15  Yes Historical Provider, MD    amLODipine (NORVASC) 10 MG tablet Take 10 mg by mouth daily.   Yes Historical Provider, MD  atenolol (TENORMIN) 100 MG tablet Take 100 mg by mouth daily.   Yes Historical Provider, MD  diphenoxylate-atropine (LOMOTIL) 2.5-0.025 MG tablet Take 1 tablet by mouth 4 (four) times daily as needed for diarrhea or loose stools. Take it along with immodium 09/11/16  Yes Cammie Sickle, MD  fentaNYL (DURAGESIC - DOSED MCG/HR) 75 MCG/HR Place 1 patch (75 mcg total) onto the skin every 3 (three) days. 09/10/16  Yes Cammie Sickle, MD  isosorbide mononitrate (IMDUR) 30 MG 24 hr tablet Take 30 mg by mouth daily.   Yes Historical Provider, MD  lisinopril (PRINIVIL,ZESTRIL) 20 MG tablet Take 20 mg by mouth daily.  12/01/12  Yes Historical Provider, MD  metFORMIN (GLUCOPHAGE) 1000 MG tablet Take 1,000 mg by mouth daily with breakfast.  03/10/15  Yes Historical Provider, MD  morphine (MSIR) 15 MG tablet Take 1 tablet (15 mg total) by mouth every 6 (six) hours as needed for severe pain. 09/10/16  Yes Cammie Sickle, MD  naproxen (NAPROSYN) 500 MG tablet Take 1 tablet (500 mg total) by mouth 2 (two) times daily with a meal. 05/14/16  Yes Carrie Mew, MD  simvastatin (ZOCOR) 40 MG tablet Take 40 mg by mouth every evening.   Yes Historical Provider, MD  tamsulosin (FLOMAX) 0.4 MG CAPS capsule Take 1 capsule (0.4 mg total) by mouth daily after supper. 10/24/15  Yes Noreene Filbert, MD  fentaNYL (DURAGESIC - DOSED MCG/HR) 25 MCG/HR patch Place 1 patch (25 mcg total) onto the skin every 3 (three) days. Patient not taking: Reported on 09/14/2016 06/25/16  Cammie Sickle, MD  glimepiride (AMARYL) 4 MG tablet Take 1 tablet (4 mg total) by mouth daily with breakfast. Patient not taking: Reported on 09/10/2016 01/29/16 01/28/17  Cammie Sickle, MD   Allergies  Allergen Reactions  . Codeine Sulfate Nausea And Vomiting  . Prednisone Nausea And Vomiting    FAMILY HISTORY:  family history is not on  file. He was adopted. SOCIAL HISTORY:  reports that he has been smoking Cigarettes.  He has been smoking about 0.50 packs per day. He has never used smokeless tobacco. He reports that he drinks alcohol. He reports that he does not use drugs.  REVIEW OF SYSTEMS:   Constitutional: Negative for fever, chills, weight loss, malaise/fatigue and diaphoresis.  HENT: Negative for hearing loss, ear pain, nosebleeds, congestion, sore throat, neck pain, tinnitus and ear discharge.   Eyes: Negative for blurred vision, double vision, photophobia, pain, discharge and redness.  Respiratory: Negative for cough, hemoptysis, sputum production, shortness of breath, wheezing and stridor.   Cardiovascular: Negative for chest pain, palpitations, orthopnea, claudication, leg swelling and PND.  Gastrointestinal: Negative for heartburn, nausea, vomiting, abdominal pain, diarrhea, constipation, blood in stool and melena.  Genitourinary: Negative for dysuria, urgency, frequency, hematuria and flank pain.  Musculoskeletal: Negative for myalgias, back pain, joint pain and falls.  Skin: Negative for itching and rash.  Neurological: Negative for dizziness, tingling, tremors, sensory change, speech change, focal weakness, seizures, loss of consciousness, weakness and headaches.  Endo/Heme/Allergies: Negative for environmental allergies and polydipsia. Does not bruise/bleed easily.  SUBJECTIVE:  Patient  States that "he has been feeling alright"  VITAL SIGNS: Temp:  [96.9 F (36.1 C)-98.9 F (37.2 C)] 98.5 F (36.9 C) (05/01 2034) Petersen Rate:  [52-93] 68 (05/01 2015) Resp:  [9-19] 13 (05/01 2015) BP: (76-98)/(43-76) 90/76 (05/01 2009) SpO2:  [92 %-98 %] 97 % (05/01 2027) Weight:  [165 lb (74.8 kg)-198 lb (89.8 kg)] 165 lb (74.8 kg) (05/01 1519)  PHYSICAL EXAMINATION: General:  Caucasian male, in no acute distress at this time Neuro: Awake,Alert, oriented HEENT:  AT,[place], No JVD Cardiovascular:  S1S2,Regular, no  m/r/g Lungs:  Clear bilaterally, no wheezes,crackles,rhonchi noted Abdomen:  Soft, NT,ND,+BS, No guarding  Musculoskeletal:  No edema, cyanosis noted Skin:  Warm, dry and intact   Recent Labs Lab 09/10/16 1405 09/14/16 1409  NA 133* 132*  K 4.0 3.6  CL 104 105  CO2 15* 17*  BUN 12 34*  CREATININE 1.40* 1.63*  GLUCOSE 140* 142*    Recent Labs Lab 09/10/16 1405 09/14/16 1409  HGB 8.3* 5.7*  HCT 24.2* 16.7*  WBC 7.0 4.6  PLT 359 239   No results found.  ASSESSMENT / PLAN:  Lower GIB Acute Blood loss Anemia Hypotension secondary to Acute Blood Loss Prostate cancer with wide metastasis Hyponatremia Chronic Kidney Disease   Plan Gastroenterology consulted Transfuse 2 units of PRBC'S Trend Hgb and HCT Continue Protonix gtt Keep MAP >65, may need pressors Will hold anticoagulation Strict I/O Replace electrolytes per usual guidelines. Follows with Dr. Rogue Bussing at the Cancer center   [facility] Pulmonary and [place]   09/14/2016, 8:47 PM   PCCM ATTENDING ATTESTATION:  I have evaluated patient with the APP [place], reviewed database in its entirety and discussed care plan in detail. In addition, this patient was discussed on multidisciplinary rounds.   Important exam findings: NAD BP, rhythm normal Chest clear RRR s M NABS No edema  Hgb stable over last 6 hrs  Major problems  addressed by PCCM team: GI bleed - unclear site (upper vs lower?). He reports some burning in LUQ and some dark tarry stools. Colonoscopy from 01/2015 noted.   PLAN/REC: Transfer to telemetry GI to see. He is NPO in anticipation of possible procedure Continue PPI   Merton Border, MD PCCM service Mobile [phone] Pager [phone] 09/15/2016 8:53 AM

## 2016-09-14 NOTE — Telephone Encounter (Signed)
Per v/o Dr. Rogue Bussing- hold chemotherapy today and r/s for 09/16/16.

## 2016-09-15 ENCOUNTER — Inpatient Hospital Stay: Payer: BLUE CROSS/BLUE SHIELD

## 2016-09-15 ENCOUNTER — Telehealth: Payer: Self-pay | Admitting: *Deleted

## 2016-09-15 DIAGNOSIS — F4321 Adjustment disorder with depressed mood: Secondary | ICD-10-CM

## 2016-09-15 DIAGNOSIS — F4320 Adjustment disorder, unspecified: Secondary | ICD-10-CM

## 2016-09-15 DIAGNOSIS — D62 Acute posthemorrhagic anemia: Secondary | ICD-10-CM

## 2016-09-15 DIAGNOSIS — K922 Gastrointestinal hemorrhage, unspecified: Secondary | ICD-10-CM

## 2016-09-15 DIAGNOSIS — R578 Other shock: Secondary | ICD-10-CM

## 2016-09-15 DIAGNOSIS — D649 Anemia, unspecified: Secondary | ICD-10-CM

## 2016-09-15 DIAGNOSIS — Z515 Encounter for palliative care: Secondary | ICD-10-CM

## 2016-09-15 DIAGNOSIS — Z7189 Other specified counseling: Secondary | ICD-10-CM

## 2016-09-15 LAB — BPAM RBC
BLOOD PRODUCT EXPIRATION DATE: 201805242359
Blood Product Expiration Date: 201805052359
ISSUE DATE / TIME: 201805012018
ISSUE DATE / TIME: 201805011723
UNIT TYPE AND RH: 5100
Unit Type and Rh: 9500

## 2016-09-15 LAB — GLUCOSE, CAPILLARY
GLUCOSE-CAPILLARY: 185 mg/dL — AB (ref 65–99)
Glucose-Capillary: 152 mg/dL — ABNORMAL HIGH (ref 65–99)
Glucose-Capillary: 173 mg/dL — ABNORMAL HIGH (ref 65–99)
Glucose-Capillary: 94 mg/dL (ref 65–99)

## 2016-09-15 LAB — TYPE AND SCREEN
ABO/RH(D): O POS
ANTIBODY SCREEN: NEGATIVE
UNIT DIVISION: 0
Unit division: 0

## 2016-09-15 LAB — HEMOGLOBIN AND HEMATOCRIT, BLOOD
HEMATOCRIT: 27.2 % — AB (ref 40.0–52.0)
Hemoglobin: 9.2 g/dL — ABNORMAL LOW (ref 13.0–18.0)

## 2016-09-15 LAB — BASIC METABOLIC PANEL
ANION GAP: 10 (ref 5–15)
BUN: 25 mg/dL — AB (ref 6–20)
CHLORIDE: 108 mmol/L (ref 101–111)
CO2: 17 mmol/L — AB (ref 22–32)
Calcium: 7.1 mg/dL — ABNORMAL LOW (ref 8.9–10.3)
Creatinine, Ser: 1.4 mg/dL — ABNORMAL HIGH (ref 0.61–1.24)
GFR calc Af Amer: >60 mL/min (ref >60)
GFR, EST NON AFRICAN AMERICAN: 53 mL/min — AB (ref >60)
GLUCOSE: 199 mg/dL — AB (ref 65–99)
POTASSIUM: 4.6 mmol/L (ref 3.5–5.1)
Sodium: 135 mmol/L (ref 135–145)

## 2016-09-15 LAB — CBC
HEMATOCRIT: 28.5 % — AB (ref 40.0–52.0)
HEMOGLOBIN: 9.5 g/dL — AB (ref 13.0–18.0)
MCH: 27.9 pg (ref 26.0–34.0)
MCHC: 33.4 g/dL (ref 32.0–36.0)
MCV: 83.5 fL (ref 80.0–100.0)
Platelets: 277 10*3/uL (ref 150–440)
RBC: 3.42 MIL/uL — ABNORMAL LOW (ref 4.40–5.90)
RDW: 17.9 % — AB (ref 11.5–14.5)
WBC: 6.8 10*3/uL (ref 3.8–10.6)

## 2016-09-15 MED ORDER — TEMAZEPAM 7.5 MG PO CAPS: 15.0000 mg | ORAL_CAPSULE | Freq: Every evening | ORAL | Status: DC | PRN

## 2016-09-15 MED ORDER — LACTATED RINGERS IV SOLN
INTRAVENOUS | Status: DC
Start: 2016-09-15 — End: 2016-09-17
  Administered 2016-09-15 – 2016-09-17 (×3): via INTRAVENOUS

## 2016-09-15 MED ORDER — SODIUM CHLORIDE 0.9 % IV SOLN: INTRAVENOUS | Status: DC

## 2016-09-15 MED ORDER — INSULIN ASPART 100 UNIT/ML ~~LOC~~ SOLN
0.0000 [IU] | Freq: Three times a day (TID) | SUBCUTANEOUS | Status: DC
  Administered 2016-09-15: 2 [IU] via SUBCUTANEOUS
  Filled 2016-09-15: qty 2

## 2016-09-15 MED ORDER — INSULIN ASPART 100 UNIT/ML ~~LOC~~ SOLN
0.0000 [IU] | Freq: Every day | SUBCUTANEOUS | Status: DC
  Administered 2016-09-16: 21:00:00 3 [IU] via SUBCUTANEOUS
  Filled 2016-09-15: qty 3

## 2016-09-15 MED ORDER — CARVEDILOL 3.125 MG PO TABS
3.1250 mg | ORAL_TABLET | Freq: Two times a day (BID) | ORAL | Status: DC
  Administered 2016-09-15 – 2016-09-17 (×3): 3.125 mg via ORAL
  Filled 2016-09-15 (×3): qty 1

## 2016-09-15 MED ORDER — PEG 3350-KCL-NA BICARB-NACL 420 G PO SOLR
4000.0000 mL | Freq: Once | ORAL | Status: AC
  Administered 2016-09-15: 14:00:00 4000 mL via ORAL
  Filled 2016-09-15: qty 4000

## 2016-09-15 NOTE — Progress Notes (Signed)
Pt transported in stable condition with all belongings. Pt family aware. Gerald Stabs, RN at bed side.

## 2016-09-15 NOTE — Telephone Encounter (Signed)
md made aware.

## 2016-09-15 NOTE — Progress Notes (Signed)
Inpatient Diabetes Program Recommendations  AACE/ADA: New Consensus Statement on Inpatient Glycemic Control (2015)  Target Ranges:  Prepandial:   less than 140 mg/dL      Peak postprandial:   less than 180 mg/dL (1-2 hours)      Critically ill patients:  140 - 180 mg/dL   Results for DOVID, BARTKO (MRN [id number]) as of 09/15/2016 11:13  Ref. Range 09/14/2016 20:04 09/14/2016 22:54 09/15/2016 08:10  Glucose-Capillary Latest Ref Range: 65 - 99 mg/dL 234 (H) 239 (H) 185 (H)   Review of Glycemic Control  Diabetes history: DM2 Outpatient Diabetes medications: Metformin 1000 mg QAM, Amaryl 4 mg QAM Current orders for Inpatient glycemic control: Novolog 0-9 units TID with meals  Inpatient Diabetes Program Recommendations: Correction (SSI): Please consider ordering Novolog 0-5 units QHS for bedtime correction scale.  NOTE: Patient received Decadron 10 mg x 1 on 09/14/16 which is contributing to hyperglycemia. No other steroids are ordered.  Thanks, Barnie Alderman, RN, MSN, CDE Diabetes Coordinator Inpatient Diabetes Program [phone] (Team Pager from 8am to 5pm)

## 2016-09-15 NOTE — Consult Note (Signed)
Sean Petersen Consult   Reason for Consult:  Consult for 61 year old man with cancer on the grounds of his being sad and potentially depressed Referring Physician:  Ether Griffins Patient Identification: Sean Petersen MRN:  [id number] Principal Diagnosis: Grief Diagnosis:   Patient Active Problem List   Diagnosis Date Noted  . Grief [F43.20] 09/15/2016  . Anemia [D64.9] 09/14/2016  . Dehydration [E86.0] 01/20/2016  . Diarrhea [R19.7] 12/04/2015  . Anxiety [F41.9] 08/22/2015  . Chronic kidney disease (CKD), stage III (moderate) [N18.3] 08/22/2015  . BP (high blood pressure) [I10] 08/22/2015  . Current tobacco use [Z72.0] 08/22/2015  . Type 2 diabetes mellitus ([place]) [E11.9] 08/22/2015  . Seizure disorder ([place]) [N82.956] 08/01/2015  . Tobacco use disorder, continuous [F17.209] 08/01/2015  . Primary osteoarthritis of right hip [M16.11] 05/01/2015  . 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 CAD in native artery [I25.10] 05/14/2014  . Malignant neoplasm of prostate ([place]) [C61] 02/23/2013  . Myocardial infarction [facility]) [I21.9] 05/17/2002    Total Time spent with patient: 1 hour  Subjective:   Sean Petersen is a 61 y.o. male patient admitted with "it's a lot for me to bear".  HPI:  Patient seen. Chart reviewed. 61 year old man with metastatic prostate cancer. Also having GI bleeding. Scheduled for endoscopy and colonoscopy tomorrow. Patient was in the intensive care unit was noted to be tearful and described himself as feeling very sad. I saw the patient this evening. He was surprised to see a psychiatrist and at first taken [place]. He did not want to go into a lot of detail about his mood because it made him feel worse. He did acknowledge that finding out how advanced his cancer is has been very difficult for him. He tells me that thing he is most upset about his not being able to take care of his wife. His wife has multiple sclerosis and  he had actually been taking care of her at home until recently. He is worried about how she will do as his disease progresses and he passes away. Patient does have chronic pain and is also concerned about whether he will be able to get adequate pain control. He says in the past he feels he has been denied pain control because of peoples worry that he would abuse the medicine. He is not sleeping very well at night often because of his pain. Patient denies having suicidal thoughts. Denies any psychotic symptoms. Denies any homicidal ideation.  Social history: Patient lives with his wife. She is apparently also quite sick in the 2 of them both need assistance now. He says there is some extended family that is helpful. He is hoping that he will be able to get more home health assistance in the future.  Medical history: Metastatic prostate cancer to multiple locations also having GI bleeding.  Substance abuse history: Denies any alcohol or drug abuse history  Past Psychiatric History: No past psychiatric treatment other than when necessary Xanax for anxiety and panicky feelings. No history of psychiatric hospitalization no history of suicide attempts never had any specific psychiatric treatment or medication in the past.  Risk to Self: Is patient at risk for suicide?: No Risk to Others:   Prior Inpatient Therapy:   Prior Outpatient Therapy:    Past Medical History:  Past Medical History:  Diagnosis Date  . Anxiety   . Cancer [facility])    states he had cancer in his left forearm but is unaware of  type and was told his body took care of it  . Chronic kidney disease    stage 3  . Depression   . Essential hypertension, benign   . Family history of adverse reaction to anesthesia    adopted  . Myocardial infarction ([place])   . Obesity   . Other and unspecified hyperlipidemia   . PONV (postoperative nausea and vomiting)    nausea  . Prostate cancer ([place])   . Type II or unspecified type diabetes  mellitus without mention of complication, uncontrolled    Metformin  . Varicosities     Past Surgical History:  Procedure Laterality Date  . CARDIAC CATHETERIZATION    . COLONOSCOPY  2013  . COLONOSCOPY WITH PROPOFOL N/A 01/23/2015   Procedure: COLONOSCOPY WITH PROPOFOL;  Surgeon: Hulen Luster, MD;  Location[facility] ENDOSCOPY;  Service: Gastroenterology;  Laterality: N/A;  . INTRAOPERATIVE ARTERIOGRAM     for vascular disease  . TONSILLECTOMY    . TOTAL HIP ARTHROPLASTY Right 05/01/2015   Procedure: TOTAL HIP ARTHROPLASTY ANTERIOR APPROACH;  Surgeon: Hessie Knows, MD;  Location: [facility] ORS;  Service: Orthopedics;  Laterality: Right;   Family History:  Family History  Problem Relation Age of Onset  . Adopted: Yes   Family Psychiatric  History: Does not know of any family history of mental illness Social History:  History  Alcohol Use  . 0.0 oz/week    Comment: 28 12 ounce beers per week     History  Drug Use No    Social History   Social History  . Marital status: Married    Spouse name: N/A  . Number of children: N/A  . Years of education: N/A   Social History Main Topics  . Smoking status: Current Every Day Smoker    Packs/day: 0.50    Types: Cigarettes  . Smokeless tobacco: Never Used  . Alcohol use 0.0 oz/week     Comment: 28 12 ounce beers per week  . Drug use: No  . Sexual activity: Not Asked   Other Topics Concern  . None   Social History Narrative  . None   Additional Social History:    Allergies:   Allergies  Allergen Reactions  . Codeine Sulfate Nausea And Vomiting  . Prednisone Nausea And Vomiting    Labs:  Results for orders placed or performed during the hospital encounter of 09/14/16 (from the past 48 hour(s))  Prepare RBC     Status: None   Collection Time: 09/14/16  3:33 PM  Result Value Ref Range   Order Confirmation ORDER PROCESSED BY BLOOD BANK   Type and screen [place]     Status: None   Collection Time:  09/14/16  3:33 PM  Result Value Ref Range   ABO/RH(D) O POS    Antibody Screen NEG    Sample Expiration 09/17/2016    Unit Number [id number]    Blood Component Type RED CELLS,LR    Unit division 00    Status of Unit ISSUED,FINAL    Transfusion Status OK TO TRANSFUSE    Crossmatch Result Compatible    Unit Number [id number]    Blood Component Type RED CELLS,LR    Unit division 00    Status of Unit ISSUED,FINAL    Transfusion Status OK TO TRANSFUSE    Crossmatch Result Compatible   Prepare RBC     Status: None   Collection Time: 09/14/16  5:30 PM  Result Value Ref Range   Order  Confirmation ORDER PROCESSED BY BLOOD BANK   Glucose, capillary     Status: Abnormal   Collection Time: 09/14/16  8:04 PM  Result Value Ref Range   Glucose-Capillary 234 (H) 65 - 99 mg/dL  MRSA PCR Screening     Status: None   Collection Time: 09/14/16  8:11 PM  Result Value Ref Range   MRSA by PCR NEGATIVE NEGATIVE    Comment:        The GeneXpert MRSA Assay (FDA approved for NASAL specimens only), is one component of a comprehensive MRSA colonization surveillance program. It is not intended to diagnose MRSA infection nor to guide or monitor treatment for MRSA infections.   Glucose, capillary     Status: Abnormal   Collection Time: 09/14/16 10:54 PM  Result Value Ref Range   Glucose-Capillary 239 (H) 65 - 99 mg/dL  Hemoglobin and hematocrit, blood     Status: Abnormal   Collection Time: 09/14/16 11:37 PM  Result Value Ref Range   Hemoglobin 9.2 (L) 13.0 - 18.0 g/dL   HCT 27.2 (L) 40.0 - 04.5 %  Basic metabolic panel     Status: Abnormal   Collection Time: 09/15/16  5:39 AM  Result Value Ref Range   Sodium 135 135 - 145 mmol/L   Potassium 4.6 3.5 - 5.1 mmol/L   Chloride 108 101 - 111 mmol/L   CO2 17 (L) 22 - 32 mmol/L   Glucose, Bld 199 (H) 65 - 99 mg/dL   BUN 25 (H) 6 - 20 mg/dL   Creatinine, Ser 1.40 (H) 0.61 - 1.24 mg/dL   Calcium 7.1 (L) 8.9 - 10.3 mg/dL   GFR calc non Af  Amer 53 (L) >60 mL/min   GFR calc Af Amer >60 >60 mL/min    Comment: (NOTE) The eGFR has been calculated using the CKD EPI equation. This calculation has not been validated in all clinical situations. eGFR's persistently <60 mL/min signify possible Chronic Kidney Disease.    Anion gap 10 5 - 15  CBC     Status: Abnormal   Collection Time: 09/15/16  5:39 AM  Result Value Ref Range   WBC 6.8 3.8 - 10.6 K/uL   RBC 3.42 (L) 4.40 - 5.90 MIL/uL   Hemoglobin 9.5 (L) 13.0 - 18.0 g/dL   HCT 28.5 (L) 40.0 - 52.0 %   MCV 83.5 80.0 - 100.0 fL   MCH 27.9 26.0 - 34.0 pg   MCHC 33.4 32.0 - 36.0 g/dL   RDW 17.9 (H) 11.5 - 14.5 %   Platelets 277 150 - 440 K/uL  Glucose, capillary     Status: Abnormal   Collection Time: 09/15/16  8:10 AM  Result Value Ref Range   Glucose-Capillary 185 (H) 65 - 99 mg/dL  Glucose, capillary     Status: Abnormal   Collection Time: 09/15/16 12:45 PM  Result Value Ref Range   Glucose-Capillary 152 (H) 65 - 99 mg/dL  Glucose, capillary     Status: Abnormal   Collection Time: 09/15/16  5:25 PM  Result Value Ref Range   Glucose-Capillary 173 (H) 65 - 99 mg/dL  Glucose, capillary     Status: None   Collection Time: 09/15/16  8:31 PM  Result Value Ref Range   Glucose-Capillary 94 65 - 99 mg/dL    Current Facility-Administered Medications  Medication Dose Route Frequency Provider Last Rate Last Dose  . 0.9 %  sodium chloride infusion   Intravenous Once Hillary Bow, MD      . [  START ON 09/16/2016] 0.9 %  sodium chloride infusion   Intravenous Continuous Theodoro Grist, MD      . albuterol (PROVENTIL) (2.5 MG/3ML) 0.083% nebulizer solution 2.5 mg  2.5 mg Nebulization Q2H PRN Srikar Sudini, MD      . ALPRAZolam Duanne Moron) tablet 0.5 mg  0.5 mg Oral TID PRN Hillary Bow, MD      . carvedilol (COREG) tablet 3.125 mg  3.125 mg Oral BID WC Theodoro Grist, MD   3.125 mg at 09/15/16 1702  . fentaNYL (DURAGESIC - dosed mcg/hr) 75 mcg  75 mcg Transdermal Q72H Hillary Bow, MD    75 mcg at 09/14/16 2227  . HYDROcodone-acetaminophen (NORCO/VICODIN) 5-325 MG per tablet 1-2 tablet  1-2 tablet Oral Q4H PRN Srikar Sudini, MD      . insulin aspart (novoLOG) injection 0-5 Units  0-5 Units Subcutaneous QHS Theodoro Grist, MD      . insulin aspart (novoLOG) injection 0-9 Units  0-9 Units Subcutaneous TID WC Theodoro Grist, MD   2 Units at 09/15/16 1736  . lactated ringers infusion   Intravenous Continuous Wilhelmina Mcardle, MD 50 mL/hr at 09/15/16 0900    . morphine (MSIR) tablet 15 mg  15 mg Oral Q6H PRN Hillary Bow, MD   15 mg at 09/15/16 6010  . ondansetron (ZOFRAN) injection 4 mg  4 mg Intravenous Q6H PRN Srikar Sudini, MD      . pantoprazole (PROTONIX) 80 mg in sodium chloride 0.9 % 250 mL (0.32 mg/mL) infusion  8 mg/hr Intravenous Continuous Nance Pear, MD 25 mL/hr at 09/15/16 1517 8 mg/hr at 09/15/16 1517  . [START ON 09/18/2016] pantoprazole (PROTONIX) injection 40 mg  40 mg Intravenous Q12H Nance Pear, MD      . polyethylene glycol (MIRALAX / GLYCOLAX) packet 17 g  17 g Oral Daily PRN Srikar Sudini, MD      . sodium chloride flush (NS) 0.9 % injection 3 mL  3 mL Intravenous Q12H Srikar Sudini, MD   3 mL at 09/15/16 1000  . tamsulosin (FLOMAX) capsule 0.4 mg  0.4 mg Oral QPC supper Hillary Bow, MD   0.4 mg at 09/15/16 1736  . temazepam (RESTORIL) capsule 15 mg  15 mg Oral QHS PRN Gonzella Lex, MD        Musculoskeletal: Strength & Muscle Tone: decreased Gait & Station: unable to stand Patient leans: N/A  Psychiatric Specialty Exam: Physical Exam  Nursing note and vitals reviewed. Constitutional: He appears well-developed. He appears distressed.  HENT:  Head: Normocephalic and atraumatic.  Eyes: Conjunctivae are normal. Pupils are equal, round, and reactive to light.  Neck: Normal range of motion.  Cardiovascular: Regular rhythm and normal heart sounds.   Respiratory: Effort normal. No respiratory distress.  GI: Soft.  Musculoskeletal: Normal range of  motion.  Neurological: He is alert.  Skin: Skin is warm and dry.  Psychiatric: His speech is normal. Judgment normal. He is slowed. Thought content is not paranoid. Cognition and memory are normal. He exhibits a depressed mood. He expresses no homicidal and no suicidal ideation.    Review of Systems  Constitutional: Positive for malaise/fatigue and weight loss.  HENT: Negative.   Eyes: Negative.   Respiratory: Negative.   Cardiovascular: Negative.   Gastrointestinal: Positive for blood in stool.  Musculoskeletal: Positive for back pain and neck pain.  Skin: Negative.   Neurological: Positive for weakness.  Psychiatric/Behavioral: Negative for depression, hallucinations, memory loss, substance abuse and suicidal ideas. The patient has insomnia. The patient  is not nervous/anxious.     Blood pressure 109/63, pulse 62, temperature 98.2 F (36.8 C), temperature source Oral, resp. rate 16, height '5\' 8"'$  (1.727 m), weight 81 kg (178 lb 8 oz), SpO2 98 %.Body mass index is 27.14 kg/m.  General Appearance: Casual  Eye Contact:  Fair  Speech:  Slow  Volume:  Decreased  Mood:  Dysphoric  Affect:  Depressed and Tearful  Thought Process:  Goal Directed  Orientation:  Full (Time, Place, and Person)  Thought Content:  Logical  Suicidal Thoughts:  No  Homicidal Thoughts:  No  Memory:  Immediate;   Good Recent;   Fair Remote;   Fair  Judgement:  Good  Insight:  Good  Psychomotor Activity:  Decreased  Concentration:  Concentration: Fair  Recall:  [employer] of Knowledge:  Fair  Language:  Fair  Akathisia:  No  Handed:  Right  AIMS (if indicated):     Assets:  Communication Skills Desire for Improvement Housing  ADL's:  Impaired  Cognition:  Impaired,  Mild  Sleep:        Treatment Plan Summary: Daily contact with patient to assess and evaluate symptoms and progress in treatment, Medication management and Plan 61 year old man who is having some sadness as would be anticipated in his  situation. He is really just now coming to terms with how end-stage his disease is. Has very valid concerns about his wife's care and about his pain. Patient does not appear to necessarily have major depression. Does not appear to necessarily need or benefit from antidepressant medicine. I did tell him I would add a when necessary medicine for sleep if he felt that would be helpful for him. Strongly encouraged him to work with palliative care and focus on his own comfort. I will follow-up as needed. Patient was appreciative but did not want to go into more detail as talking about the situation tended to make him cry even more.  Disposition: Patient does not meet criteria for psychiatric inpatient admission. Supportive therapy provided about ongoing stressors.  Alethia Berthold, MD 09/15/2016 8:37 PM

## 2016-09-15 NOTE — Progress Notes (Signed)
[facility] [place] NAME: Sean Petersen    MR#:  [id number]  DATE OF BIRTH:  [birth year]  SUBJECTIVE:  CHIEF COMPLAINT:   Chief Complaint  Patient presents with  . Anemia  The patient is a 61-year-old Caucasian male with past medical history significant for metastatic prostate carcinoma, essential hypertension, chronic pain, who presents to the hospital with complaints of hypotension with systolic blood pressure in 80s. The hemoglobin level was found to be low as 5.7, he was transfused with 2 units of packed red blood cells after which hemoglobin level has improved. Patient noted on and off bright red blood per rectum following while now, also some black stool. Admits of some abdominal discomfort, but not with food intake. Wants to eat. Most recent colonoscopy by Dr. Candace Cruise in 2016, revealing AVMs, hemorrhoids.  Review of Systems  Constitutional: Negative for chills, fever and weight loss.  HENT: Negative for congestion.   Eyes: Negative for blurred vision and double vision.  Respiratory: Negative for cough, sputum production, shortness of breath and wheezing.   Cardiovascular: Negative for chest pain, palpitations, orthopnea, leg swelling and PND.  Gastrointestinal: Positive for abdominal pain and blood in stool. Negative for constipation, diarrhea, nausea and vomiting.  Genitourinary: Negative for dysuria, frequency, hematuria and urgency.  Musculoskeletal: Negative for falls.  Neurological: Negative for dizziness, tremors, focal weakness and headaches.  Endo/Heme/Allergies: Does not bruise/bleed easily.  Psychiatric/Behavioral: Negative for depression. The patient does not have insomnia.     VITAL SIGNS: Blood pressure 111/64, pulse 68, temperature 97.7 F (36.5 C), temperature source Axillary, resp. rate 14, height 5\' 8"  (1.727 m), weight 82 kg (180 lb 12.4 oz), SpO2 99 %.  PHYSICAL EXAMINATION:   GENERAL:  61 y.o.-year-old patient  lying in the bed with no acute distress, pale, somewhat uncomfortable on the stretcher, anxious.  EYES: Pupils equal, round, reactive to light and accommodation. No scleral icterus. Extraocular muscles intact.  HEENT: Head atraumatic, normocephalic. Oropharynx and nasopharynx clear.  NECK:  Supple, no jugular venous distention. No thyroid enlargement, no tenderness.  LUNGS: Normal breath sounds bilaterally, no wheezing, rales,rhonchi or crepitation. No use of accessory muscles of respiration.  CARDIOVASCULAR: S1, S2 normal. No murmurs, rubs, or gallops.  ABDOMEN: Soft, nontender, nondistended. Bowel sounds present. No organomegaly or mass.  EXTREMITIES: Trace lower extremity and pedal edema, no  cyanosis, or clubbing.  NEUROLOGIC: Cranial nerves II through XII are intact. Muscle strength 5/5 in all extremities. Sensation intact. Gait not checked.  PSYCHIATRIC: The patient is alert and oriented x 3.  SKIN: No obvious rash, lesion, or ulcer.   ORDERS/RESULTS REVIEWED:   CBC  Recent Labs Lab 09/10/16 1405 09/14/16 1409 09/14/16 2337 09/15/16 0539  WBC 7.0 4.6  --  6.8  HGB 8.3* 5.7* 9.2* 9.5*  HCT 24.2* 16.7* 27.2* 28.5*  PLT 359 239  --  277  MCV 82.3 82.5  --  83.5  MCH 28.2 28.3  --  27.9  MCHC 34.2 34.3  --  33.4  RDW 19.3* 19.4*  --  17.9*  LYMPHSABS 0.8* 0.6*  --   --   MONOABS 0.6 0.5  --   --   EOSABS 0.1 0.1  --   --   BASOSABS 0.1 0.0  --   --    ------------------------------------------------------------------------------------------------------------------  Chemistries   Recent Labs Lab 09/10/16 1405 09/14/16 1409 09/15/16 0539  NA 133* 132* 135  K 4.0 3.6 4.6  CL 104 105  108  CO2 15* 17* 17*  GLUCOSE 140* 142* 199*  BUN 12 34* 25*  CREATININE 1.40* 1.63* 1.40*  CALCIUM 8.4* 7.0* 7.1*  AST 29  --   --   ALT 21  --   --   ALKPHOS 186*  --   --   BILITOT 1.2  --   --     ------------------------------------------------------------------------------------------------------------------ estimated creatinine clearance is 53.6 mL/min (A) (by C-G formula based on SCr of 1.4 mg/dL (H)). ------------------------------------------------------------------------------------------------------------------ No results for input(s): TSH, T4TOTAL, T3FREE, THYROIDAB in the last 72 hours.  Invalid input(s): FREET3  Cardiac Enzymes No results for input(s): CKMB, TROPONINI, MYOGLOBIN in the last 168 hours.  Invalid input(s): CK ------------------------------------------------------------------------------------------------------------------ Invalid input(s): POCBNP ---------------------------------------------------------------------------------------------------------------  RADIOLOGY: No results found.  EKG:  Orders placed or performed during the hospital encounter of 04/23/15  . EKG 12 lead  . EKG 12 lead    ASSESSMENT AND PLAN:  Active Problems:   Anemia  #1. Acute posthemorrhagic anemia, status post packed red blood cell transfusion, hemoglobin level has improved, follow-up level in the morning and transfuse as needed,no recent bleeding #2. Gastrointestinal bleeding, status post colonoscopy in September 2016 by Dr. 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 The patient in consultation and recommended colonoscopy and EGD tomorrow, continue PPI intravenously, nothing by mouth, stop nonsteroidal on that, to medications, H. pylori stool antigen #3. Hypotension, resolved with transfusion and IV fluid administration, follow closely #4. 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 Metastatic prostate cancer, patient is agreeable to palliative care consult, possible hospice care at home  Management plans discussed with the patient, family and they are  in agreement.   DRUG ALLERGIES:  Allergies  Allergen Reactions  . Codeine Sulfate Nausea And Vomiting  . Prednisone Nausea And Vomiting    CODE STATUS:     Code Status Orders        Start     Ordered   09/14/16 1705  Full code  Continuous     09/14/16 1705    Code Status History    Date Active Date Inactive Code Status Order ID Comments User Context   05/01/2015  1:52 PM 05/04/2015  5:57 PM Full Code [id number]  Hessie Knows, MD Inpatient      TOTAL TIME TAKING CARE OF THIS PATIENT: 40 minutes.  Discussed with the patient and patient's sister, all questions were answered, they voiced understanding  Talbot Monarch M.D on 09/15/2016 at 12:32 PM  Between 7am to 6pm - Pager - [phone]  After 6pm go to [email or web address] - password [email or web address] [facility]  Office  [phone]  CC: Primary care physician; Tracie Harrier, MD

## 2016-09-15 NOTE — Progress Notes (Signed)
Ch responded to an OR for Pt in IC-1. [place] spoke with RN who stated that the Pt is dealing with a heavy load, (his cancer, his wife has MS) Pt presented emotional as he spoke of his illness. Pt stated that his largest worry is for his wife who needs care. Pt stated that he has been given 1/2 to 4 years left. Pt became emotional and suggested that another visit would be better. [place] agreed and will follow up later this afternoon. CH offered prayer, which was appreciated. CH provided the ministry of empathetic listening. CH is available for follow up as needed.    09/15/16 0900  Clinical Encounter Type  Visited With Patient;Health care provider  Visit Type Initial;Spiritual support;Critical Care  Referral From Nurse  Consult/Referral To Chaplain  Spiritual Encounters  Spiritual Needs Prayer;Emotional

## 2016-09-15 NOTE — Progress Notes (Signed)
Ch made a follow up visit with Pt who was moved to room 112. Pt was glad to see me and appreciated me finding him. Pt will have a procedure tomorrow morning. Pt is hopeful to be discharged afterwards. He desires to be with his wife, even if he is unable to give her care. [place] will visit in the morning before his procedure.    09/15/16 1400  Clinical Encounter Type  Visited With Patient  Visit Type Follow-up  Consult/Referral To Chaplain  Spiritual Encounters  Spiritual Needs Emotional

## 2016-09-15 NOTE — Telephone Encounter (Signed)
Wanted Dr Rogue Bussing to know that Mr Sides is in ICU

## 2016-09-15 NOTE — Care Management (Signed)
RNCM met briefly with patient and his sister. Patient is interested in hospice. He lives with his wife that has limited mobility due to [place]. I provided patient and his sister a list of home health agencies and private duty agencies. MD came in ; discussed this briefly with her also in presence of patient. RNCM left contact information for any questions which patient appreciated.

## 2016-09-15 NOTE — Consult Note (Signed)
Consultation Note Date: 09/15/16  Patient Name: Sean Petersen  DOB: 09/23/1955  MRN: [id number]  Age / Sex: 61 y.o., male  PCP: Tracie Harrier, MD Referring Physician: Theodoro Grist, MD  Reason for Consultation: Establishing goals of care  HPI/Patient Profile: 61 y.o. male  with past medical history of metastatic prostate cancer, diabetes mellitus, MI, essential hypertension, depression, CKD, and anxiety admitted on 09/14/2016 from cancer center with low blood pressure and anemia. Hgb 5.7 and received blood transfusion. Patient has noticed intermittent bloody stool over the last few days. Followed by Dr. Rogue Bussing for prostate cancer with mets to bone and currently receiving chemotherapy. GI consulted and plan to perform EGD and colonoscopy on 09/16/16. Palliative medicine consultation for goals of care.   Clinical Assessment and Goals of Care: I have reviewed medical records, discussed with care team, and met with patient at bedside to discuss diagnosis, GOC, EOL wishes, disposition and options.  Introduced Palliative Medicine as specialized medical care for people living with serious illness. It focuses on providing relief from the symptoms and stress of a serious illness. The goal is to improve quality of life for both the patient and the family. Patient alert, oriented, and able to participate in the conversation.   Patient was diagnosed with prostate cancer 5-6 years ago. He tells me he "has one of the fastest spreading bone cancers they have seen." He speaks of his battle with cancer and multiple treatment options but knowing it is "incurable." [name] thought many times that he would die within months and then chemotherapy would "give me more time." He is married and speaks of his wife's struggle with MS. He is her primary caregiver. No children but supportive family. Prior to hospitalization, patient speaks of  "good days and bad days." His appetite has declined over the last year. Currently, he is waiting on blood testing to result for "the next treatment option."   Discussed diagnoses, interventions, and underlying cancer. Patient prepping for colonoscopy scheduled for the AM. Complains of heart palpitations and "the doctor needs to restart all my heart medications." Explained that these were held on admission because of his low blood pressure. He states "I know my body and know when my blood pressure is rising." Checked vitals. BP 121/72 with HR 72. Notified Dr. Ether Griffins to review medications.   I attempted to elicit values and goals of care important to the patient. His wife is most important to him. He becomes tearful during the conversation because he is most afraid of dying and leaving her behind. [name] also states "I would rather be in the hospital then die." He is willing to pursue all further options that Dr. Rogue Bussing offers in order to extend life. Explained that depending on the course of this hospitalization, it may not be appropriate to re-start chemotherapy.   Advanced directives, concepts specific to code status, and artifical feeding and hydration were considered and discussed. He has not thought much about resuscitation/life support. Tells me he would not want to live "as  a vegetable." Educated on my recommendation for DNR/DNI with underlying cancer and co-morbidities. He does not want to make any decisions without speaking with his wife. Encouraged he read [employer] Cho[employer] and discuss these decisions he will be faced with in the future as his cancer continues to progress.    Questions and concerns were addressed. Emotional and spiritual support provided.    SUMMARY OF RECOMMENDATIONS    FULL code/FULL scope treatment. Educated on my recommendation for DNR/DNI with underlying cancer and co-morbidities.   Patient wishes to continue chemotherapy if offered by oncology. (will depend on course  of hospitalization)  Agreeable with palliative services to follow on discharge. Could benefit from hospice if chemotherapy is no longer an option.   Colonoscopy and EGD in AM.   PMT will continue to support patient/family through hospitalization.   Code Status/Advance Care Planning:  Full code   Symptom Management:   Oncology managing--may benefit from an increase in fentanyl patch.  Palliative Prophylaxis:   Aspiration, Delirium Protocol, Frequent Pain Assessment and Oral Care  Additional Recommendations (Limitations, Scope, Preferences):  Full Scope Treatment  Psycho-social/Spiritual:   Desire for further Chaplaincy support:yes  Additional Recommendations: Caregiving  Support/Resources and Education on Hospice  Prognosis:   Unable to determine: guarded with metastatic prostate cancer to bone  Discharge Planning: To Be Determined      Primary Diagnoses: Present on Admission: . Anemia   I have reviewed the medical record, interviewed the patient and family, and examined the patient. The following aspects are pertinent.  Past Medical History:  Diagnosis Date  . Anxiety   . Cancer [facility])    states he had cancer in his left forearm but is unaware of type and was told his body took care of it  . Chronic kidney disease    stage 3  . Depression   . Essential hypertension, benign   . Family history of adverse reaction to anesthesia    adopted  . Myocardial infarction ([place])   . Obesity   . Other and unspecified hyperlipidemia   . PONV (postoperative nausea and vomiting)    nausea  . Prostate cancer ([place])   . Type II or unspecified type diabetes mellitus without mention of complication, uncontrolled    Metformin  . Varicosities    Social History   Social History  . Marital status: Married    Spouse name: N/A  . Number of children: N/A  . Years of education: N/A   Social History Main Topics  . Smoking status: Current Every Day Smoker    Packs/day:  0.50    Types: Cigarettes  . Smokeless tobacco: Never Used  . Alcohol use 0.0 oz/week     Comment: 28 12 ounce beers per week  . Drug use: No  . Sexual activity: Not Asked   Other Topics Concern  . None   Social History Narrative  . None   Family History  Problem Relation Age of Onset  . Adopted: Yes   Scheduled Meds: . carvedilol  3.125 mg Oral BID WC  . fentaNYL  75 mcg Transdermal Q72H  . insulin aspart  0-5 Units Subcutaneous QHS  . insulin aspart  0-9 Units Subcutaneous TID WC  . [START ON 09/18/2016] pantoprazole  40 mg Intravenous Q12H  . sodium chloride flush  3 mL Intravenous Q12H  . tamsulosin  0.4 mg Oral QPC supper   Continuous Infusions: . sodium chloride    . sodium chloride    . lactated ringers 50  mL/hr at 09/16/16 0107  . pantoprozole (PROTONIX) infusion 8 mg/hr (09/16/16 0104)   PRN Meds:.albuterol, ALPRAZolam, HYDROcodone-acetaminophen, morphine, [DISCONTINUED] ondansetron **OR** ondansetron (ZOFRAN) IV, polyethylene glycol, temazepam Medications Prior to Admission:  Prior to Admission medications   Medication Sig Start Date End Date Taking? Authorizing Provider  ALPRAZolam Duanne Moron) 0.5 MG tablet Take 0.5 mg by mouth 2 (two) times daily as needed for sleep.  08/01/15  Yes Historical Provider, MD  amLODipine (NORVASC) 10 MG tablet Take 10 mg by mouth daily.   Yes Historical Provider, MD  atenolol (TENORMIN) 100 MG tablet Take 100 mg by mouth daily.   Yes Historical Provider, MD  diphenoxylate-atropine (LOMOTIL) 2.5-0.025 MG tablet Take 1 tablet by mouth 4 (four) times daily as needed for diarrhea or loose stools. Take it along with immodium 09/11/16  Yes Cammie Sickle, MD  fentaNYL (DURAGESIC - DOSED MCG/HR) 75 MCG/HR Place 1 patch (75 mcg total) onto the skin every 3 (three) days. 09/10/16  Yes Cammie Sickle, MD  isosorbide mononitrate (IMDUR) 30 MG 24 hr tablet Take 30 mg by mouth daily.   Yes Historical Provider, MD  lisinopril  (PRINIVIL,ZESTRIL) 20 MG tablet Take 20 mg by mouth daily.  12/01/12  Yes Historical Provider, MD  metFORMIN (GLUCOPHAGE) 1000 MG tablet Take 1,000 mg by mouth daily with breakfast.  03/10/15  Yes Historical Provider, MD  morphine (MSIR) 15 MG tablet Take 1 tablet (15 mg total) by mouth every 6 (six) hours as needed for severe pain. 09/10/16  Yes Cammie Sickle, MD  naproxen (NAPROSYN) 500 MG tablet Take 1 tablet (500 mg total) by mouth 2 (two) times daily with a meal. 05/14/16  Yes Carrie Mew, MD  simvastatin (ZOCOR) 40 MG tablet Take 40 mg by mouth every evening.   Yes Historical Provider, MD  tamsulosin (FLOMAX) 0.4 MG CAPS capsule Take 1 capsule (0.4 mg total) by mouth daily after supper. 10/24/15  Yes Noreene Filbert, MD  fentaNYL (DURAGESIC - DOSED MCG/HR) 25 MCG/HR patch Place 1 patch (25 mcg total) onto the skin every 3 (three) days. Patient not taking: Reported on 09/14/2016 06/25/16   Cammie Sickle, MD  glimepiride (AMARYL) 4 MG tablet Take 1 tablet (4 mg total) by mouth daily with breakfast. Patient not taking: Reported on 09/10/2016 01/29/16 01/28/17  Cammie Sickle, MD   Allergies  Allergen Reactions  . Codeine Sulfate Nausea And Vomiting  . Prednisone Nausea And Vomiting   Review of Systems  Cardiovascular:       Low blood pressure  Gastrointestinal: Positive for blood in stool.   Physical Exam  Constitutional: He is oriented to person, place, and time. He is cooperative.  HENT:  Head: Normocephalic and atraumatic.  Cardiovascular: Regular rhythm.   Patient c/o heart palpitations  Pulmonary/Chest: Effort normal and breath sounds normal.  Abdominal: Normal appearance.  Neurological: He is alert and oriented to person, place, and time.  Skin: Skin is warm and dry. There is pallor.  Psychiatric: His speech is normal and behavior is normal. Cognition and memory are normal. He exhibits a depressed mood.  Nursing note and vitals reviewed.  Vital Signs: BP 117/63  (BP Location: Left Arm)   Pulse 73   Temp 98.6 F (37 C) (Oral)   Resp 16   Ht '5\' 8"'$  (1.727 m)   Wt 81.7 kg (180 lb 1.6 oz)   SpO2 92%   BMI 27.38 kg/m  Pain Assessment: No/denies pain   Pain Score: 0-No pain  SpO2:  SpO2: 92 % O2 Device:SpO2: 92 % O2 Flow Rate: .   IO: Intake/output summary:   Intake/Output Summary (Last 24 hours) at 09/16/16 1024 Last data filed at 09/16/16 0549  Gross per 24 hour  Intake          1561.25 ml  Output              300 ml  Net          1261.25 ml    LBM: Last BM Date: 09/15/16 Baseline Weight: Weight: 89.8 kg (198 lb) Most recent weight: Weight: 81.7 kg (180 lb 1.6 oz)     Palliative Assessment/Data: PPS 60%   Flowsheet Rows     Most Recent Value  Intake Tab  Referral Department  Critical care  Unit at Time of Referral  ICU  Palliative Care Primary Diagnosis  Cancer  Palliative Care Type  New Palliative care  Reason for referral  Clarify Goals of Care  Date first seen by Palliative Care  09/15/16  Clinical Assessment  Palliative Performance Scale Score  60%  Psychosocial & Spiritual Assessment  Palliative Care Outcomes  Patient/Family meeting held?  Yes  Who was at the meeting?  patient  Palliative Care Outcomes  Clarified goals of care, Provided psychosocial or spiritual support, Counseled regarding hospice, ACP counseling assistance, Provided end of life care assistance, Linked to palliative care logitudinal support      Time In: 5672 Time Out: 1645 Time Total: 73mn Greater than 50%  of this time was spent counseling and coordinating care related to the above assessment and plan.  Signed by:  MIhor Dow FNP-C Palliative Medicine Team  Phone: [phone]: [phone]  Please contact Palliative Medicine Team phone at [phone] questions and concerns.  For individual provider: See AShea Evans

## 2016-09-15 NOTE — Consult Note (Signed)
Sean Bellows MD  [address] [place], [place] [address] Phone: [phone] Fax : [phone]  Consultation  Referring Provider:     Dr Alva Garnet Primary Care Physician:  Tracie Harrier, MD Primary Gastroenterologist:  Dr. [facility]          Reason for Consultation:     Anemia   Date of Admission:  09/14/2016 Date of Consultation:  09/15/2016         HPI:   Sean Petersen is a 61 y.o. male with a history of metastatic prostate cancer presented with symptomatic anemia with hypotension yesterday .   He came into the ER with a Hb of 5.7 grams with mcv 82 ,1 month back Hb was 11.3 grams with mcv of 86 he has had on and off rectal bleeding .On admission also noted to be in AKI Last colonoscopy by Dr Candace Cruise in 2016 showed possible radiation proctitis which he treated with APC and a few polyps were resected.   Not on any blood thinners. He has been taking various nsaids for many years on a daily basis. He says his stool has been black for a few weeks but has noticed blood in his stool for many years since radiation for his prostate cancer. Presently no rectal bleeding.   Past Medical History:  Diagnosis Date  . Anxiety   . Cancer [facility]    states he had cancer in his left forearm but is unaware of type and was told his body took care of it  . Chronic kidney disease    stage 3  . Depression   . Essential hypertension, benign   . Family history of adverse reaction to anesthesia    adopted  . Myocardial infarction ([place])   . Obesity   . Other and unspecified hyperlipidemia   . PONV (postoperative nausea and vomiting)    nausea  . Prostate cancer ([place])   . Type II or unspecified type diabetes mellitus without mention of complication, uncontrolled    Metformin  . Varicosities     Past Surgical History:  Procedure Laterality Date  . CARDIAC CATHETERIZATION    . COLONOSCOPY  2013  . COLONOSCOPY WITH PROPOFOL N/A 01/23/2015   Procedure: COLONOSCOPY WITH PROPOFOL;  Surgeon: Hulen Luster,  MD;  Location: [facility] ENDOSCOPY;  Service: Gastroenterology;  Laterality: N/A;  . INTRAOPERATIVE ARTERIOGRAM     for vascular disease  . TONSILLECTOMY    . TOTAL HIP ARTHROPLASTY Right 05/01/2015   Procedure: TOTAL HIP ARTHROPLASTY ANTERIOR APPROACH;  Surgeon: Hessie Knows, MD;  Location: [facility] ORS;  Service: Orthopedics;  Laterality: Right;    Prior to Admission medications   Medication Sig Start Date End Date Taking? Authorizing Provider  ALPRAZolam Duanne Moron) 0.5 MG tablet Take 0.5 mg by mouth 2 (two) times daily as needed for sleep.  08/01/15  Yes Historical Provider, MD  amLODipine (NORVASC) 10 MG tablet Take 10 mg by mouth daily.   Yes Historical Provider, MD  atenolol (TENORMIN) 100 MG tablet Take 100 mg by mouth daily.   Yes Historical Provider, MD  diphenoxylate-atropine (LOMOTIL) 2.5-0.025 MG tablet Take 1 tablet by mouth 4 (four) times daily as needed for diarrhea or loose stools. Take it along with immodium 09/11/16  Yes Cammie Sickle, MD  fentaNYL (DURAGESIC - DOSED MCG/HR) 75 MCG/HR Place 1 patch (75 mcg total) onto the skin every 3 (three) days. 09/10/16  Yes Cammie Sickle, MD  isosorbide mononitrate (IMDUR) 30 MG 24 hr tablet Take 30 mg by mouth  daily.   Yes Historical Provider, MD  lisinopril (PRINIVIL,ZESTRIL) 20 MG tablet Take 20 mg by mouth daily.  12/01/12  Yes Historical Provider, MD  metFORMIN (GLUCOPHAGE) 1000 MG tablet Take 1,000 mg by mouth daily with breakfast.  03/10/15  Yes Historical Provider, MD  morphine (MSIR) 15 MG tablet Take 1 tablet (15 mg total) by mouth every 6 (six) hours as needed for severe pain. 09/10/16  Yes Cammie Sickle, MD  naproxen (NAPROSYN) 500 MG tablet Take 1 tablet (500 mg total) by mouth 2 (two) times daily with a meal. 05/14/16  Yes Carrie Mew, MD  simvastatin (ZOCOR) 40 MG tablet Take 40 mg by mouth every evening.   Yes Historical Provider, MD  tamsulosin (FLOMAX) 0.4 MG CAPS capsule Take 1 capsule (0.4 mg total) by mouth  daily after supper. 10/24/15  Yes Noreene Filbert, MD  fentaNYL (DURAGESIC - DOSED MCG/HR) 25 MCG/HR patch Place 1 patch (25 mcg total) onto the skin every 3 (three) days. Patient not taking: Reported on 09/14/2016 06/25/16   Cammie Sickle, MD  glimepiride (AMARYL) 4 MG tablet Take 1 tablet (4 mg total) by mouth daily with breakfast. Patient not taking: Reported on 09/10/2016 01/29/16 01/28/17  Cammie Sickle, MD    Family History  Problem Relation Age of Onset  . Adopted: Yes     Social History  Substance Use Topics  . Smoking status: Current Every Day Smoker    Packs/day: 0.50    Types: Cigarettes  . Smokeless tobacco: Never Used  . Alcohol use 0.0 oz/week     Comment: 28 12 ounce beers per week    Allergies as of 09/14/2016 - Review Complete 09/14/2016  Allergen Reaction Noted  . Codeine sulfate Nausea And Vomiting 02/22/2013  . Prednisone Nausea And Vomiting 10/25/2014    Review of Systems:    All systems reviewed and negative except where noted in HPI.   Physical Exam:  Vital signs in last 24 hours: Temp:  [96.9 F (36.1 C)-98.9 F (37.2 C)] 97.6 F (36.4 C) (05/01 2346) Pulse Rate:  [52-93] 65 (05/02 0700) Resp:  [0-22] 16 (05/02 0700) BP: (76-111)/(43-76) 102/62 (05/02 0700) SpO2:  [90 %-100 %] 95 % (05/02 0700) Weight:  [165 lb (74.8 kg)-198 lb (89.8 kg)] 180 lb 12.4 oz (82 kg) (05/02 0500)   General:   Pleasant, cooperative in NAD Head:  Normocephalic and atraumatic. Eyes:   No icterus.   Conjunctiva pink. PERRLA. Ears:  Normal auditory acuity. Neck:  Supple; no masses or thyroidomegaly Lungs: Respirations even and unlabored. Lungs clear to auscultation bilaterally.   No wheezes, crackles, or rhonchi.  Heart:  Regular rate and rhythm;  Without murmur, clicks, rubs or gallops Abdomen:  Soft, nondistended, nontender. Normal bowel sounds. No appreciable masses or hepatomegaly.  No rebound or guarding.  Rectal:  Not performed. Msk:  Symmetrical without  gross deformities.   Extremities:  Without edema, cyanosis or clubbing. Neurologic:  Alert and oriented x3;  grossly normal neurologically. Cervical Nodes:  No significant cervical adenopathy. Psych:  Alert and cooperative. Normal affect.  LAB RESULTS:  Recent Labs  09/14/16 1409 09/14/16 2337 09/15/16 0539  WBC 4.6  --  6.8  HGB 5.7* 9.2* 9.5*  HCT 16.7* 27.2* 28.5*  PLT 239  --  277   BMET  Recent Labs  09/14/16 1409 09/15/16 0539  NA 132* 135  K 3.6 4.6  CL 105 108  CO2 17* 17*  GLUCOSE 142* 199*  BUN 34* 25*  CREATININE 1.63* 1.40*  CALCIUM 7.0* 7.1*   LFT No results for input(s): PROT, ALBUMIN, AST, ALT, ALKPHOS, BILITOT, BILIDIR, IBILI in the last 72 hours. PT/INR No results for input(s): LABPROT, INR in the last 72 hours.  STUDIES: No results found.    Impression / Plan:   Sean Petersen is a 61 y.o. y/o male with metastatic prostate cancer and possible radiation proctitis presents with acute blood loss anemia with rectal bleeding. Last episode was 3 days back , none since. Hb has been stable after transfusion. Likely lower GI bleed based on history that is chronic but may have had in addition an upper GI bleed due to mucosal damage from long term NSAID use  Plan   1. Monitor CBC, transfuse as needed 2. PPI 3. Colonoscopy + EGD tomorrow 4. In the interim if has overt bleeding then will need tagged RBC scan .  5. Stop all NSAID's  6. H pylori stool antigen  Thank you for involving me in the care of this patient.      LOS: 1 day   Sean Bellows, MD  09/15/2016, 9:47 AM

## 2016-09-16 ENCOUNTER — Encounter: Payer: Self-pay | Admitting: Anesthesiology

## 2016-09-16 ENCOUNTER — Encounter
Admission: EM | Disposition: A | Discharge status: Hospice | Payer: Self-pay | Source: Home / Self Care | Attending: Internal Medicine

## 2016-09-16 ENCOUNTER — Inpatient Hospital Stay: Payer: BLUE CROSS/BLUE SHIELD | Admitting: Certified Registered Nurse Anesthetist

## 2016-09-16 ENCOUNTER — Telehealth: Payer: Self-pay | Admitting: *Deleted

## 2016-09-16 ENCOUNTER — Inpatient Hospital Stay: Payer: BLUE CROSS/BLUE SHIELD

## 2016-09-16 ENCOUNTER — Ambulatory Visit: Payer: BLUE CROSS/BLUE SHIELD

## 2016-09-16 DIAGNOSIS — Z7984 Long term (current) use of oral hypoglycemic drugs: Secondary | ICD-10-CM

## 2016-09-16 DIAGNOSIS — F329 Major depressive disorder, single episode, unspecified: Secondary | ICD-10-CM

## 2016-09-16 DIAGNOSIS — E669 Obesity, unspecified: Secondary | ICD-10-CM

## 2016-09-16 DIAGNOSIS — I959 Hypotension, unspecified: Secondary | ICD-10-CM

## 2016-09-16 DIAGNOSIS — C61 Malignant neoplasm of prostate: Secondary | ICD-10-CM

## 2016-09-16 DIAGNOSIS — C779 Secondary and unspecified malignant neoplasm of lymph node, unspecified: Secondary | ICD-10-CM

## 2016-09-16 DIAGNOSIS — R5383 Other fatigue: Secondary | ICD-10-CM

## 2016-09-16 DIAGNOSIS — C7951 Secondary malignant neoplasm of bone: Secondary | ICD-10-CM

## 2016-09-16 DIAGNOSIS — E119 Type 2 diabetes mellitus without complications: Secondary | ICD-10-CM

## 2016-09-16 DIAGNOSIS — K921 Melena: Principal | ICD-10-CM

## 2016-09-16 DIAGNOSIS — F17210 Nicotine dependence, cigarettes, uncomplicated: Secondary | ICD-10-CM

## 2016-09-16 DIAGNOSIS — Z79899 Other long term (current) drug therapy: Secondary | ICD-10-CM

## 2016-09-16 DIAGNOSIS — E43 Unspecified severe protein-calorie malnutrition: Secondary | ICD-10-CM | POA: Insufficient documentation

## 2016-09-16 DIAGNOSIS — K640 First degree hemorrhoids: Secondary | ICD-10-CM

## 2016-09-16 DIAGNOSIS — I252 Old myocardial infarction: Secondary | ICD-10-CM

## 2016-09-16 DIAGNOSIS — Z7189 Other specified counseling: Secondary | ICD-10-CM

## 2016-09-16 DIAGNOSIS — D122 Benign neoplasm of ascending colon: Secondary | ICD-10-CM

## 2016-09-16 DIAGNOSIS — R531 Weakness: Secondary | ICD-10-CM

## 2016-09-16 DIAGNOSIS — R630 Anorexia: Secondary | ICD-10-CM

## 2016-09-16 DIAGNOSIS — Z515 Encounter for palliative care: Secondary | ICD-10-CM

## 2016-09-16 HISTORY — PX: COLONOSCOPY WITH PROPOFOL: SHX5780

## 2016-09-16 HISTORY — PX: ESOPHAGOGASTRODUODENOSCOPY (EGD) WITH PROPOFOL: SHX5813

## 2016-09-16 LAB — HEMOGLOBIN A1C
Hgb A1c MFr Bld: 6.1 % — ABNORMAL HIGH (ref 4.8–5.6)
Mean Plasma Glucose: 128 mg/dL

## 2016-09-16 LAB — GLUCOSE, CAPILLARY
GLUCOSE-CAPILLARY: 95 mg/dL (ref 65–99)
GLUCOSE-CAPILLARY: 89 mg/dL (ref 65–99)
GLUCOSE-CAPILLARY: 265 mg/dL — AB (ref 65–99)
Glucose-Capillary: 101 mg/dL — ABNORMAL HIGH (ref 65–99)
Glucose-Capillary: 95 mg/dL (ref 65–99)

## 2016-09-16 LAB — BASIC METABOLIC PANEL
Anion gap: 8 (ref 5–15)
BUN: 18 mg/dL (ref 6–20)
CALCIUM: 6.8 mg/dL — AB (ref 8.9–10.3)
CO2: 20 mmol/L — ABNORMAL LOW (ref 22–32)
CREATININE: 1.12 mg/dL (ref 0.61–1.24)
Chloride: 112 mmol/L — ABNORMAL HIGH (ref 101–111)
GFR calc Af Amer: >60 mL/min (ref >60)
GLUCOSE: 86 mg/dL (ref 65–99)
Potassium: 3.4 mmol/L — ABNORMAL LOW (ref 3.5–5.1)
Sodium: 140 mmol/L (ref 135–145)

## 2016-09-16 LAB — HIV ANTIBODY (ROUTINE TESTING W REFLEX): HIV Screen 4th Generation wRfx: NONREACTIVE

## 2016-09-16 LAB — HEMOGLOBIN: Hemoglobin: 8.4 g/dL — ABNORMAL LOW (ref 13.0–18.0)

## 2016-09-16 SURGERY — ESOPHAGOGASTRODUODENOSCOPY (EGD) WITH PROPOFOL
Anesthesia: General

## 2016-09-16 MED ORDER — HYDROCORTISONE ACETATE 25 MG RE SUPP
25.0000 mg | Freq: Two times a day (BID) | RECTAL | Status: DC
  Administered 2016-09-16 – 2016-09-17 (×2): 25 mg via RECTAL
  Filled 2016-09-16 (×5): qty 1

## 2016-09-16 MED ORDER — PANTOPRAZOLE SODIUM 40 MG PO TBEC
40.0000 mg | DELAYED_RELEASE_TABLET | Freq: Every day | ORAL | Status: DC
  Administered 2016-09-16 – 2016-09-17 (×2): 40 mg via ORAL
  Filled 2016-09-16 (×2): qty 1

## 2016-09-16 MED ORDER — GLYCOPYRROLATE 0.2 MG/ML IJ SOLN
INTRAMUSCULAR | Status: AC
  Filled 2016-09-16: qty 1

## 2016-09-16 MED ORDER — SODIUM CHLORIDE 0.9 % IV SOLN: INTRAVENOUS | Status: DC

## 2016-09-16 MED ORDER — PROPOFOL 10 MG/ML IV BOLUS
INTRAVENOUS | Status: AC
  Filled 2016-09-16: qty 20

## 2016-09-16 MED ORDER — SIMVASTATIN 20 MG PO TABS
40.0000 mg | ORAL_TABLET | Freq: Every day | ORAL | Status: DC
  Administered 2016-09-16: 18:00:00 40 mg via ORAL
  Filled 2016-09-16: qty 2

## 2016-09-16 MED ORDER — PROPOFOL 10 MG/ML IV BOLUS
INTRAVENOUS | Status: DC | PRN
  Administered 2016-09-16 (×3): 50 mg via INTRAVENOUS

## 2016-09-16 MED ORDER — PROPOFOL 500 MG/50ML IV EMUL
INTRAVENOUS | Status: DC | PRN
  Administered 2016-09-16: 150 ug/kg/min via INTRAVENOUS

## 2016-09-16 MED ORDER — PROPOFOL 500 MG/50ML IV EMUL
INTRAVENOUS | Status: AC
  Filled 2016-09-16: qty 50

## 2016-09-16 MED ORDER — GLYCOPYRROLATE 0.2 MG/ML IJ SOLN
INTRAMUSCULAR | Status: DC | PRN
  Administered 2016-09-16: 0.2 mg via INTRAVENOUS

## 2016-09-16 NOTE — H&P (Signed)
Sean Bellows MD [address], [place], [place] [address] Phone: [phone] Fax : [phone]  Primary Care Physician:  Tracie Harrier, MD Primary Gastroenterologist:  Dr. Jonathon Petersen   Pre-Procedure History & Physical: HPI:  Sean Petersen is a 61 y.o. male is here for an endoscopy and colonoscopy.   Past Medical History:  Diagnosis Date  . Anxiety   . Cancer [facility])    states he had cancer in his left forearm but is unaware of type and was told his body took care of it  . Chronic kidney disease    stage 3  . Depression   . Essential hypertension, benign   . Family history of adverse reaction to anesthesia    adopted  . Myocardial infarction ([place])   . Obesity   . Other and unspecified hyperlipidemia   . PONV (postoperative nausea and vomiting)    nausea  . Prostate cancer ([place])   . Type II or unspecified type diabetes mellitus without mention of complication, uncontrolled    Metformin  . Varicosities     Past Surgical History:  Procedure Laterality Date  . CARDIAC CATHETERIZATION    . COLONOSCOPY  2013  . COLONOSCOPY WITH PROPOFOL N/A 01/23/2015   Procedure: COLONOSCOPY WITH PROPOFOL;  Surgeon: Hulen Luster, MD;  Location: [facility] ENDOSCOPY;  Service: Gastroenterology;  Laterality: N/A;  . INTRAOPERATIVE ARTERIOGRAM     for vascular disease  . TONSILLECTOMY    . TOTAL HIP ARTHROPLASTY Right 05/01/2015   Procedure: TOTAL HIP ARTHROPLASTY ANTERIOR APPROACH;  Surgeon: Hessie Knows, MD;  Location: [facility] ORS;  Service: Orthopedics;  Laterality: Right;    Prior to Admission medications   Medication Sig Start Date End Date Taking? Authorizing Provider  ALPRAZolam Duanne Moron) 0.5 MG tablet Take 0.5 mg by mouth 2 (two) times daily as needed for sleep.  08/01/15  Yes Historical Provider, MD  amLODipine (NORVASC) 10 MG tablet Take 10 mg by mouth daily.   Yes Historical Provider, MD  atenolol (TENORMIN) 100 MG tablet Take 100 mg by mouth daily.   Yes Historical Provider, MD    diphenoxylate-atropine (LOMOTIL) 2.5-0.025 MG tablet Take 1 tablet by mouth 4 (four) times daily as needed for diarrhea or loose stools. Take it along with immodium 09/11/16  Yes Cammie Sickle, MD  fentaNYL (DURAGESIC - DOSED MCG/HR) 75 MCG/HR Place 1 patch (75 mcg total) onto the skin every 3 (three) days. 09/10/16  Yes Cammie Sickle, MD  isosorbide mononitrate (IMDUR) 30 MG 24 hr tablet Take 30 mg by mouth daily.   Yes Historical Provider, MD  lisinopril (PRINIVIL,ZESTRIL) 20 MG tablet Take 20 mg by mouth daily.  12/01/12  Yes Historical Provider, MD  metFORMIN (GLUCOPHAGE) 1000 MG tablet Take 1,000 mg by mouth daily with breakfast.  03/10/15  Yes Historical Provider, MD  morphine (MSIR) 15 MG tablet Take 1 tablet (15 mg total) by mouth every 6 (six) hours as needed for severe pain. 09/10/16  Yes Cammie Sickle, MD  naproxen (NAPROSYN) 500 MG tablet Take 1 tablet (500 mg total) by mouth 2 (two) times daily with a meal. 05/14/16  Yes Carrie Mew, MD  simvastatin (ZOCOR) 40 MG tablet Take 40 mg by mouth every evening.   Yes Historical Provider, MD  tamsulosin (FLOMAX) 0.4 MG CAPS capsule Take 1 capsule (0.4 mg total) by mouth daily after supper. 10/24/15  Yes Noreene Filbert, MD  fentaNYL (DURAGESIC - DOSED MCG/HR) 25 MCG/HR patch Place 1 patch (25 mcg total) onto the  skin every 3 (three) days. Patient not taking: Reported on 09/14/2016 06/25/16   Cammie Sickle, MD  glimepiride (AMARYL) 4 MG tablet Take 1 tablet (4 mg total) by mouth daily with breakfast. Patient not taking: Reported on 09/10/2016 01/29/16 01/28/17  Cammie Sickle, MD    Allergies as of 09/14/2016 - Review Complete 09/14/2016  Allergen Reaction Noted  . Codeine sulfate Nausea And Vomiting 02/22/2013  . Prednisone Nausea And Vomiting 10/25/2014    Family History  Problem Relation Age of Onset  . Adopted: Yes    Social History   Social History  . Marital status: Married    Spouse name: N/A  .  Number of children: N/A  . Years of education: N/A   Occupational History  . Not on file.   Social History Main Topics  . Smoking status: Current Every Day Smoker    Packs/day: 0.50    Types: Cigarettes  . Smokeless tobacco: Never Used  . Alcohol use 0.0 oz/week     Comment: 28 12 ounce beers per week  . Drug use: No  . Sexual activity: Not on file   Other Topics Concern  . Not on file   Social History Narrative  . No narrative on file    Review of Systems: See HPI, otherwise negative ROS  Physical Exam: BP 122/68   Pulse 73   Temp 99.3 F (37.4 C)   Resp 16   Ht 5\' 8"  (1.727 m)   Wt 180 lb 1.6 oz (81.7 kg)   SpO2 91%   BMI 27.38 kg/m  General:   Alert,  pleasant and cooperative in NAD Head:  Normocephalic and atraumatic. Neck:  Supple; no masses or thyromegaly. Lungs:  Clear throughout to auscultation.    Heart:  Regular rate and rhythm. Abdomen:  Soft, nontender and nondistended. Normal bowel sounds, without guarding, and without rebound.   Neurologic:  Alert and  oriented x4;  grossly normal neurologically.  Impression/Plan: Sean Petersen is here for an endoscopy and colonoscopy to be performed for gi bleeding   Risks, benefits, limitations, and alternatives regarding  endoscopy and colonoscopy have been reviewed with the patient.  Questions have been answered.  All parties agreeable.   Sean Bellows, MD  09/16/2016, 11:44 AM

## 2016-09-16 NOTE — Anesthesia Post-op Follow-up Note (Cosign Needed)
Anesthesia QCDR form completed.        

## 2016-09-16 NOTE — Anesthesia Preprocedure Evaluation (Signed)
Anesthesia Evaluation  Patient identified by MRN, date of birth, ID band Patient awake    Reviewed: Allergy & Precautions, H&P , NPO status , Patient's Chart, lab work & pertinent test results, reviewed documented beta blocker date and time   History of Anesthesia Complications (+) PONV, AWARENESS UNDER ANESTHESIA, Family history of anesthesia reaction and history of anesthetic complications  Airway Mallampati: III  TM Distance: >3 FB Neck ROM: full    Dental  (+) Poor Dentition   Pulmonary neg shortness of breath, neg sleep apnea, neg COPD, neg recent URI, Current Smoker,           Cardiovascular Exercise Tolerance: Good hypertension, (-) angina+ CAD and + Past MI  (-) Cardiac Stents and (-) CABG (-) dysrhythmias (-) Valvular Problems/Murmurs     Neuro/Psych PSYCHIATRIC DISORDERS (depression and anxiety) negative neurological ROS     GI/Hepatic negative GI ROS, Neg liver ROS,   Endo/Other  diabetes  Renal/GU CRFRenal disease  negative genitourinary   Musculoskeletal   Abdominal   Peds  Hematology  (+) Blood dyscrasia, anemia ,   Anesthesia Other Findings Past Medical History:   Type II or unspecified type diabetes mellitus *              Other and unspecified hyperlipidemia                         Essential hypertension, benign                               Myocardial infarction                                        Anxiety                                                      Depression                                                   Prostate cancer                                              Cancer                                                         Comment:states he had cancer in his left forearm but is              unaware of type and was told his body took care              of it   Obesity  Varicosities                                                  Chronic kidney disease                                         Comment:stage 3   Reproductive/Obstetrics negative OB ROS                             Anesthesia Physical  Anesthesia Plan  ASA: III  Anesthesia Plan: General   Post-op Pain Management:    Induction:   Airway Management Planned:   Additional Equipment:   Intra-op Plan:   Post-operative Plan:   Informed Consent: I have reviewed the patients History and Physical, chart, labs and discussed the procedure including the risks, benefits and alternatives for the proposed anesthesia with the patient or authorized representative who has indicated his/her understanding and acceptance.   Dental Advisory Given  Plan Discussed with: Anesthesiologist, CRNA and Surgeon  Anesthesia Plan Comments:         Anesthesia Quick Evaluation

## 2016-09-16 NOTE — Op Note (Signed)
[facility] Gastroenterology Patient Name: Sean Petersen Procedure Date: 09/16/2016 11:49 AM MRN: [id number] Account #: [id number] Date of Birth: Jun 13, 1955 Admit Type: Inpatient Age: 61 Room: [facility] ENDO ROOM 4 Gender: Male Note Status: Finalized Procedure:            Upper GI endoscopy Indications:          Hematochezia Providers:            Jonathon Bellows MD, MD Referring MD:         Tracie Harrier, MD (Referring MD) Medicines:            Monitored Anesthesia Care Complications:        No immediate complications. Procedure:            Pre-Anesthesia Assessment:                       - Prior to the procedure, a History and Physical was                        performed, and patient medications, allergies and                        sensitivities were reviewed. The patient's tolerance of                        previous anesthesia was reviewed.                       - The risks and benefits of the procedure and the                        sedation options and risks were discussed with the                        patient. All questions were answered and informed                        consent was obtained.                       - ASA Grade Assessment: III - A patient with severe                        systemic disease.                       After obtaining informed consent, the endoscope was                        passed under direct vision. Throughout the procedure,                        the patient's blood pressure, pulse, and oxygen                        saturations were monitored continuously. The Endoscope                        was introduced through the mouth, and advanced to the  third part of duodenum. The upper GI endoscopy was                        accomplished with ease. The patient tolerated the                        procedure well. Findings:      The esophagus was normal.      The stomach was normal.      The examined duodenum was  normal. Impression:           - Normal esophagus.                       - Normal stomach.                       - Normal examined duodenum.                       - No specimens collected. Recommendation:       - Perform a colonoscopy today. Procedure Code(s):    --- Professional ---                       [phone], Esophagogastroduodenoscopy, flexible, transoral;                        diagnostic, including collection of specimen(s) by                        brushing or washing, when performed (separate procedure) Diagnosis Code(s):    --- Professional ---                       K92.1, Melena (includes Hematochezia) CPT copyright 2016 [employer]. All rights reserved. The codes documented in this report are preliminary and upon coder review may  be revised to meet current compliance requirements. Jonathon Bellows, MD Jonathon Bellows MD, MD 09/16/2016 12:05:19 PM This report has been signed electronically. Number of Addenda: 0 Note Initiated On: 09/16/2016 11:49 AM      [facility]

## 2016-09-16 NOTE — Progress Notes (Signed)
[facility] NAME: Sean Petersen    MR#:  [id number]  DATE OF BIRTH:  March 29, 1956  SUBJECTIVE:  CHIEF COMPLAINT:   Chief Complaint  Patient presents with  . Anemia   The patient is a 61-year-old Caucasian male with past medical history significant for metastatic prostate carcinoma, essential hypertension, chronic pain, who presents to the hospital with complaints of hypotension with systolic blood pressure in 80s. The hemoglobin level was found to be low as 5.7, he was transfused with 2 units of packed red blood cells after which hemoglobin level has improved. Patient noted on and off bright red blood per rectum following while now, also some black stool. Admits of some abdominal discomfort, but not with food intake. Wants to eat. Most recent colonoscopy by Dr. Candace Cruise in 2016, revealing AVMs, hemorrhoids. The patient feels comfortable today. Denies any significant pain, no nausea, vomiting or bleeding. He underwent EGD and colonoscopy today, which was remarkable only for ascending colon polyp, internal hemorrhoids, hemorrhoidal care was recommended by Dr. Vicente Males.  Review of Systems  Constitutional: Negative for chills, fever and weight loss.  HENT: Negative for congestion.   Eyes: Negative for blurred vision and double vision.  Respiratory: Negative for cough, sputum production, shortness of breath and wheezing.   Cardiovascular: Negative for chest pain, palpitations, orthopnea, leg swelling and PND.  Gastrointestinal: Negative for constipation, diarrhea, nausea and vomiting.  Genitourinary: Negative for dysuria, frequency, hematuria and urgency.  Musculoskeletal: Negative for falls.  Neurological: Negative for dizziness, tremors, focal weakness and headaches.  Endo/Heme/Allergies: Does not bruise/bleed easily.  Psychiatric/Behavioral: Negative for depression. The patient does not have insomnia.     VITAL SIGNS: Blood pressure  126/64, pulse 76, temperature 98.1 F (36.7 C), temperature source Oral, resp. rate 18, height 5\' 8"  (1.727 m), weight 81.7 kg (180 lb 1.6 oz), SpO2 93 %.  PHYSICAL EXAMINATION:   GENERAL:  61 y.o.-year-old patient lying in the bed with no acute distress, pale, somewhat uncomfortable on the stretcher, anxious.  EYES: Pupils equal, round, reactive to light and accommodation. No scleral icterus. Extraocular muscles intact.  HEENT: Head atraumatic, normocephalic. Oropharynx and nasopharynx clear.  NECK:  Supple, no jugular venous distention. No thyroid enlargement, no tenderness.  LUNGS: Normal breath sounds bilaterally, no wheezing, rales,rhonchi or crepitation. No use of accessory muscles of respiration.  CARDIOVASCULAR: S1, S2 normal. No murmurs, rubs, or gallops.  ABDOMEN: Soft, nontender, nondistended. Bowel sounds present. No organomegaly or mass.  EXTREMITIES: Trace lower extremity and pedal edema, no  cyanosis, or clubbing.  NEUROLOGIC: Cranial nerves II through XII are intact. Muscle strength 5/5 in all extremities. Sensation intact. Gait not checked.  PSYCHIATRIC: The patient is alert and oriented x 3.  SKIN: No obvious rash, lesion, or ulcer.   ORDERS/RESULTS REVIEWED:   CBC  Recent Labs Lab 09/10/16 1405 09/14/16 1409 09/14/16 2337 09/15/16 0539 09/16/16 0547  WBC 7.0 4.6  --  6.8  --   HGB 8.3* 5.7* 9.2* 9.5* 8.4*  HCT 24.2* 16.7* 27.2* 28.5*  --   PLT 359 239  --  277  --   MCV 82.3 82.5  --  83.5  --   MCH 28.2 28.3  --  27.9  --   MCHC 34.2 34.3  --  33.4  --   RDW 19.3* 19.4*  --  17.9*  --   LYMPHSABS 0.8* 0.6*  --   --   --   MONOABS 0.6  0.5  --   --   --   EOSABS 0.1 0.1  --   --   --   BASOSABS 0.1 0.0  --   --   --    ------------------------------------------------------------------------------------------------------------------  Chemistries   Recent Labs Lab 09/10/16 1405 09/14/16 1409 09/15/16 0539 09/16/16 0547  NA 133* 132* 135 140  K 4.0  3.6 4.6 3.4*  CL 104 105 108 112*  CO2 15* 17* 17* 20*  GLUCOSE 140* 142* 199* 86  BUN 12 34* 25* 18  CREATININE 1.40* 1.63* 1.40* 1.12  CALCIUM 8.4* 7.0* 7.1* 6.8*  AST 29  --   --   --   ALT 21  --   --   --   ALKPHOS 186*  --   --   --   BILITOT 1.2  --   --   --    ------------------------------------------------------------------------------------------------------------------ estimated creatinine clearance is 67 mL/min (by C-G formula based on SCr of 1.12 mg/dL). ------------------------------------------------------------------------------------------------------------------ No results for input(s): TSH, T4TOTAL, T3FREE, THYROIDAB in the last 72 hours.  Invalid input(s): FREET3  Cardiac Enzymes No results for input(s): CKMB, TROPONINI, MYOGLOBIN in the last 168 hours.  Invalid input(s): CK ------------------------------------------------------------------------------------------------------------------ Invalid input(s): POCBNP ---------------------------------------------------------------------------------------------------------------  RADIOLOGY: No results found.  EKG:  Orders placed or performed during the hospital encounter of 04/23/15  . EKG 12 lead  . EKG 12 lead    ASSESSMENT AND PLAN:  Principal Problem:   Grief Active Problems:   Anemia   Prostate cancer metastatic to bone [facility])   Palliative care by specialist   Goals of care, counseling/discussion   Protein-calorie malnutrition, severe  #1. 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 Vicente Males.  #2. Gastrointestinal bleeding, status post colonoscopy in September 2016 by Dr. 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 Vicente Males, revealing  ascending colon polyp, which was resected and retrieved, internal hemorrhoids, hemorrhoidal care was recommended, initiate patient on Anusol twice a day, follow clinically, H. pylori stool antigen is pending #3. Hypotension, resolved with transfusion and IV fluid administration, remains stable #4. Acute on chronic renal insufficiency with CK D stage II,  resolved with hydration #5. Metastatic prostate cancer, patient is awaiting for palliative care input, he was seen by oncologist, who felt that patient had aggressive course of his prostate cancer, he discussed with her patient poor prognosis and recommended palliative care/hospice. He is awaiting to discuss with palliative care representative, but it did not make decisions in regards to hospice care at home as of yet . Discussed with Dr. Rogue Bussing earlier today. #6. Acute respiratory failure with hypoxia requiring 4 L of oxygen, likely due to atelectasis, questionable aspiration during EGD/colonoscopy, getting chest x-Vint, initiating incentive spirometry, continue oxygen therapy   Management plans discussed with the patient, family and they are in agreement.   DRUG ALLERGIES:  Allergies  Allergen Reactions  . Codeine Sulfate Nausea And Vomiting  . Prednisone Nausea And Vomiting    CODE STATUS:     Code Status Orders        Start     Ordered   09/14/16 1705  Full code  Continuous     09/14/16 1705    Code Status History    Date Active Date Inactive Code Status Order ID Comments User  Context   05/01/2015  1:52 PM 05/04/2015  5:57 PM Full Code [id number]  Hessie Knows, MD Inpatient      TOTAL TIME TAKING CARE OF THIS PATIENT: 40 minutes.   Theodoro Grist M.D on 09/16/2016 at 2:39 PM  Between 7am to 6pm - Pager - [phone]  After 6pm go to [email or web address] - password [email or web address] [facility]  Office  [phone]  CC: Primary care physician; Tracie Harrier, MD

## 2016-09-16 NOTE — Op Note (Signed)
[facility] Patient Name: Sean Petersen Procedure Date: 09/16/2016 11:47 AM MRN: [id number] Account #: [id number] Date of Birth: Sep 03, 1955 Admit Type: Inpatient Age: 61 Room: [facility] ENDO ROOM 4 Gender: Male Note Status: Finalized Procedure:            Colonoscopy Indications:          Rectal bleeding Providers:            Jonathon Bellows MD, MD Referring MD:         Tracie Harrier, MD (Referring MD) Medicines:            Monitored Anesthesia Care Complications:        No immediate complications. Procedure:            Pre-Anesthesia Assessment:                       - Prior to the procedure, a History and Physical was                        performed, and patient medications, allergies and                        sensitivities were reviewed. The patient's tolerance of                        previous anesthesia was reviewed.                       - The risks and benefits of the procedure and the                        sedation options and risks were discussed with the                        patient. All questions were answered and informed                        consent was obtained.                       - ASA Grade Assessment: III - A patient with severe                        systemic disease.                       After obtaining informed consent, the colonoscope was                        passed under direct vision. Throughout the procedure,                        the patient's blood pressure, pulse, and oxygen                        saturations were monitored continuously. The                        Colonoscope was introduced through the anus and                        advanced to  the the cecum, identified by the                        appendiceal orifice, IC valve and transillumination.                        The colonoscopy was performed with ease. The patient                        tolerated the procedure well. The quality of the bowel                preparation was poor. Findings:      The perianal and digital rectal examinations were normal.      Non-bleeding internal hemorrhoids were found during retroflexion. The       hemorrhoids were large and Grade I (internal hemorrhoids that do not       prolapse).      A 13 mm polyp was found in the ascending colon. The polyp was       semi-pedunculated. The polyp was removed with a hot snare. Resection and       retrieval were complete. To prevent bleeding after the polypectomy, one       hemostatic clip was successfully placed. There was no bleeding during       the maneuver.      The exam was otherwise without abnormality on direct and retroflexion       views. Impression:           - Preparation of the colon was poor.                       - Non-bleeding internal hemorrhoids.                       - One 13 mm polyp in the ascending colon, removed with                        a hot snare. Resected and retrieved. Clip was placed.                       - The examination was otherwise normal on direct and                        retroflexion views. Recommendation:       - Return patient to hospital ward for possible                        discharge same day.                       - Advance diet as tolerated.                       - Continue present medications.                       - 1. This colonoscopy was not well prepped for polyp                        detection. No blood seen in the colon  2. Can be discharged                       3. Outpatient capsule study and possible repeat                        colonoscopy                       4. Hemorroidal care, sitz bath, anusol, high fiber diet  Procedure Code(s):    --- Professional ---                       [phone], Colonoscopy, flexible; with removal of tumor(s),                        polyp(s), or other lesion(s) by snare technique Diagnosis Code(s):    --- Professional ---                       K64.0,  First degree hemorrhoids                       D12.2, Benign neoplasm of ascending colon                       K62.5, Hemorrhage of anus and rectum CPT copyright 2016 American Medical Association. All rights reserved. The codes documented in this report are preliminary and upon coder review may  be revised to meet current compliance requirements. Jonathon Bellows, MD Jonathon Bellows MD, MD 09/16/2016 12:25:31 PM This report has been signed electronically. Number of Addenda: 0 Note Initiated On: 09/16/2016 11:47 AM Scope Withdrawal Time: 0 hours 10 minutes 51 seconds  Total Procedure Duration: 0 hours 14 minutes 12 seconds       [facility]

## 2016-09-16 NOTE — Progress Notes (Signed)
See endo note -colonoscopy for recommendations    I will sign off.  Please call me if any further GI concerns or questions.  We would like to thank you for the opportunity to participate in the care of Sean Petersen.   Dr Jonathon Bellows  Gastroenterology/Hepatology Pager: [phone]

## 2016-09-16 NOTE — Telephone Encounter (Signed)
Spoke with Seth Bake at [facility]. Needed to verify pt's stage to process [facility] testing. Confirmed stage 4 metastatic prostate cancer- pt not on active treatment, but tx being planned.

## 2016-09-16 NOTE — Progress Notes (Signed)
Called by patient's nurse to come to room.  Patient returned from endoscopy and has been given something for pain and is not taking deep breaths. Incentive initiated.  Patient able to have tidal volume of 1238ml and tolerating well.  Patient maintaining saturations of 93% on 4lpm [place] and breath sounds clear with diminished breath sounds bilateral to bases.  Explained to patient the importance of taking deep breaths.

## 2016-09-16 NOTE — Progress Notes (Addendum)
Daily Progress Note   Patient Name: Sean Petersen       Date: 09/16/2016 DOB: 04/06/1956  Age: 61 y.o. MRN#: [id number] Attending Physician: Theodoro Grist, MD Primary Care Physician: Tracie Harrier, MD Admit Date: 09/14/2016  Reason for Consultation/Follow-up: Establishing goals of care  Subjective: Patient sad/withdrawn this morning. Pending colonoscopy and EGD. Tells me he saw Dr. Rogue Bussing this morning and "not sure if there is any more hope for me." Patient becomes very tearful. He is most worried about his wife.  Provided emotional and spiritual support. We briefly discussed if chemo is no longer an option, home with hospice is the best option for him to spend quality time with his wife. He states "one day at a time."    Length of Stay: 2  Current Medications: Scheduled Meds:  . carvedilol  3.125 mg Oral BID WC  . fentaNYL  75 mcg Transdermal Q72H  . insulin aspart  0-5 Units Subcutaneous QHS  . insulin aspart  0-9 Units Subcutaneous TID WC  . [START ON 09/18/2016] pantoprazole  40 mg Intravenous Q12H  . sodium chloride flush  3 mL Intravenous Q12H  . tamsulosin  0.4 mg Oral QPC supper    Continuous Infusions: . sodium chloride    . sodium chloride    . lactated ringers 50 mL/hr at 09/16/16 0107  . pantoprozole (PROTONIX) infusion 8 mg/hr (09/16/16 0104)    PRN Meds: albuterol, ALPRAZolam, HYDROcodone-acetaminophen, morphine, [DISCONTINUED] ondansetron **OR** ondansetron (ZOFRAN) IV, polyethylene glycol, temazepam  Physical Exam  Constitutional: He is oriented to person, place, and time. He is cooperative. He appears ill.  HENT:  Head: Normocephalic and atraumatic.  Cardiovascular: Regular rhythm.   Pulmonary/Chest: Effort normal.  Abdominal: Normal appearance.    Neurological: He is alert and oriented to person, place, and time.  Skin: Skin is warm and dry. There is pallor.  Psychiatric: His speech is normal. He is withdrawn. Cognition and memory are normal. He exhibits a depressed mood.  Nursing note and vitals reviewed.          Vital Signs: BP 117/63 (BP Location: Left Arm)   Pulse 73   Temp 98.6 F (37 C) (Oral)   Resp 16   Ht 5\' 8"  (1.727 m)   Wt 81.7 kg (180 lb 1.6 oz)   SpO2 92%  BMI 27.38 kg/m  SpO2: SpO2: 92 % O2 Device: O2 Device: Not Delivered O2 Flow Rate:    Intake/output summary:   Intake/Output Summary (Last 24 hours) at 09/16/16 1049 Last data filed at 09/16/16 0549  Gross per 24 hour  Intake          1561.25 ml  Output              300 ml  Net          1261.25 ml   LBM: Last BM Date: 09/15/16 Baseline Weight: Weight: 89.8 kg (198 lb) Most recent weight: Weight: 81.7 kg (180 lb 1.6 oz)  Palliative Assessment/Data: PPS 60%   Flowsheet Rows     Most Recent Value  Intake Tab  Referral Department  Critical care  Unit at Time of Referral  ICU  Palliative Care Primary Diagnosis  Cancer  Palliative Care Type  New Palliative care  Reason for referral  Clarify Goals of Care  Date first seen by Palliative Care  09/15/16  Clinical Assessment  Palliative Performance Scale Score  60%  Psychosocial & Spiritual Assessment  Palliative Care Outcomes  Patient/Family meeting held?  Yes  Who was at the meeting?  patient  Palliative Care Outcomes  Clarified goals of care, Provided psychosocial or spiritual support, Counseled regarding hospice, ACP counseling assistance, Provided end of life care assistance, Linked to palliative care logitudinal support      Patient Active Problem List   Diagnosis Date Noted  . Prostate cancer metastatic to bone ([place])   . Palliative care by specialist   . Goals of care, counseling/discussion   . Grief 09/15/2016  . Anemia 09/14/2016  . Dehydration 01/20/2016  . Diarrhea 12/04/2015   . Anxiety 08/22/2015  . Chronic kidney disease (CKD), stage III (moderate) 08/22/2015  . BP (high blood pressure) 08/22/2015  . Current tobacco use 08/22/2015  . Type 2 diabetes mellitus ([place]) 08/22/2015  . Seizure disorder ([place]) 08/01/2015  . Tobacco use disorder, continuous 08/01/2015  . Primary osteoarthritis of right hip 05/01/2015  . 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 CAD in native artery 05/14/2014  . Malignant neoplasm of prostate ([place]) 02/23/2013  . Myocardial infarction [facility]) 05/17/2002    Palliative Care Assessment & Plan   Patient Profile: 61 y.o. male  with past medical history of metastatic prostate cancer, diabetes mellitus, MI, essential hypertension, depression, CKD, and anxiety admitted on 09/14/2016 from cancer center with low blood pressure and anemia. Hgb 5.7 and received blood transfusion. Patient has noticed intermittent bloody stool over the last few days. Followed by Dr. Rogue Bussing for prostate cancer with mets to bone and currently receiving chemotherapy. GI consulted and plan to perform EGD and colonoscopy on 09/16/16. Palliative medicine consultation for goals of care.   Assessment: Anemia Gastrointestinal bleeding Hypotension Metastatic prostate cancer to bone Acute on chronic renal insufficiency  Recommendations/Plan:  Watchful waiting--colonoscopy and EGD today.   Remains FULL code. Patient requests time to discuss DNR/DNI with wife.   Palliative to follow on discharge--if not hospice if chemotherapy is no longer an option.  PMT will continue to support patient through hospitalization.   Goals of Care and Additional Recommendations:  Limitations on Scope of Treatment: Full Scope Treatment  Code Status: FULL   Code Status Orders        Start     Ordered   09/14/16 1705  Full code  Continuous     09/14/16 1705    Code Status  History    Date Active Date Inactive Code Status Order ID Comments User  Context   05/01/2015  1:52 PM 05/04/2015  5:57 PM Full Code [id number]  Hessie Knows, MD Inpatient       Prognosis:   Unable to determine  Discharge Planning:  To Be Determined  Care plan was discussed with patient, RN, and Dr. Rogue Bussing  Thank you for allowing the Palliative Medicine Team to assist in the care of this patient.   Time In: 1000 Time Out: 1025 Total Time 39min Prolonged Time Billed  no      Greater than 50%  of this time was spent counseling and coordinating care related to the above assessment and plan.  Ihor Dow, FNP-C Palliative Medicine Team  Phone: [phone] Fax: [phone]  Please contact Palliative Medicine Team phone at [phone] for questions and concerns.

## 2016-09-16 NOTE — Progress Notes (Signed)
Initial Nutrition Assessment  DOCUMENTATION CODES:   Severe malnutrition in context of acute illness/injury (likely progressing to chronic in setting of metastatic cancer)  INTERVENTION:  -Recommend liberalizing diet to Regular -Recommend Ensure Enlive po BID, each supplement provides 350 kcal and 20 grams of protein  NUTRITION DIAGNOSIS:   Malnutrition (Severe) related to acute illness (progressing to Chronic in setting of metastatic prostate cancer) as evidenced by energy intake < or equal to 50% for > or equal to 5 days, percent weight loss, severe depletion of muscle mass, severe depletion of body fat.  GOAL:   Patient will meet greater than or equal to 90% of their needs  MONITOR:   PO intake, Supplement acceptance, Labs, Weight trends  REASON FOR ASSESSMENT:   Malnutrition Screening Tool    ASSESSMENT:   Sean Petersen  is a 61 y.o. male with a known history of Hypertension, prostate cancer with wide metastasis, chronic pain presents to the emergency room from cancer center after he was noticed to be hypotensive with systolic of 01X  Pt admitted with GI bleed Plan for EGD and colonoscopy today  Pt reports poor appetite prior to admission with 50 pound wt loss in 1 month (22% wt loss). Pt reports eating some jello every 3 days and that is it. Per weight encounters, pt weighed 208 pounds in March of this year (13.5% wt loss in 2 months which is significant for time frame)  Nutrition-Focused physical exam completed. Findings are mild/moderate to severe fat depletion, moderate to severe muscle depletion, and no edema.   Labs: CBGs 89-185 Meds: LR at 50 ml/hr, ss novolog  Diet Order:  Diet Heart Room service appropriate? Yes; Fluid consistency: Thin  Skin:  Reviewed, no issues  Last BM:  09/15/16  Height:   Ht Readings from Last 1 Encounters:  09/15/16 5\' 8"  (1.727 m)    Weight:   Wt Readings from Last 1 Encounters:  09/16/16 180 lb 1.6 oz (81.7 kg)    Ideal  Body Weight:  70 kg  BMI:  Body mass index is 27.38 kg/m.  Estimated Nutritional Needs:   Kcal:  1650-2050 calories  Protein:  82-98 gm  Fluid:  >/= 1.7L  EDUCATION NEEDS:   No education needs identified at this time  [place], [place], LDN [phone] Pager  [phone] Weekend/On-Call Pager

## 2016-09-16 NOTE — Consult Note (Signed)
[facility] CONSULT NOTE  Patient Care Team: Tracie Harrier, MD as PCP - General (Internal Medicine)  CHIEF COMPLAINTS/PURPOSE OF CONSULTATION: Metastatic prostate cancer  HISTORY OF PRESENTING ILLNESS:  Sean Petersen 61 y.o.  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 Patient was in the chemotherapy infusion suite- when he was noted to have low blood pressures; and subsequently transferred to emergency room for further workup. Patient's hemoglobin was down to 5.5; from approximately 9-10 from 2 weeks ago. Patient noted to have intermittent blood in stools few Weeks ago. Patient received blood; fluid resuscitated. He is awaiting EGD colonoscopy today.  Patient's pain seems to be steady. In general his upset given the course of his Disease. Poor appetite. No nausea no vomiting. Shortness of breath on exertion. Positive for fatigue. No diarrhea. Overall he feels weak.  ROS: A complete 10 point review of system is done which is negative except mentioned above in history of present illness  MEDICAL HISTORY:  Past Medical History:  Diagnosis Date  . Anxiety   . Cancer [facility])    states he had cancer in his left forearm but is unaware of type and was told his body took care of it  . Chronic kidney disease    stage 3  . Depression   . Essential hypertension, benign   . Family history of adverse reaction to anesthesia    adopted  . Myocardial infarction ([place])   . Obesity   . Other and unspecified hyperlipidemia   . PONV (postoperative nausea and vomiting)    nausea  . Prostate cancer ([place])   . Type II or unspecified type diabetes mellitus without mention of complication, uncontrolled    Metformin  . Varicosities     SURGICAL HISTORY: Past Surgical History:  Procedure Laterality Date  . CARDIAC CATHETERIZATION    . COLONOSCOPY  2013  . COLONOSCOPY WITH PROPOFOL N/A  01/23/2015   Procedure: COLONOSCOPY WITH PROPOFOL;  Surgeon: Hulen Luster, MD;  Location: [facility] ENDOSCOPY;  Service: Gastroenterology;  Laterality: N/A;  . INTRAOPERATIVE ARTERIOGRAM     for vascular disease  . TONSILLECTOMY    . TOTAL HIP ARTHROPLASTY Right 05/01/2015   Procedure: TOTAL HIP ARTHROPLASTY ANTERIOR APPROACH;  Surgeon: Hessie Knows, MD;  Location: [facility] ORS;  Service: Orthopedics;  Laterality: Right;    SOCIAL HISTORY: Social History   Social History  . Marital status: Married    Spouse name: N/A  . Number of children: N/A  . Years of education: N/A   Occupational History  . Not on file.   Social History Main Topics  . Smoking status: Current Every Day Smoker    Packs/day: 0.50    Types: Cigarettes  . Smokeless tobacco: Never Used  . Alcohol use 0.0 oz/week     Comment: 28 12 ounce beers per week  . Drug use: No  . Sexual activity: Not on file   Other Topics Concern  . Not on file   Social History Narrative  . No narrative on file    FAMILY HISTORY: Family History  Problem Relation Age of Onset  . Adopted: Yes    ALLERGIES:  is allergic to codeine sulfate and prednisone.  MEDICATIONS:  Current Facility-Administered Medications  Medication Dose Route Frequency Provider Last Rate Last Dose  . [MAR Hold] 0.9 %  sodium chloride infusion   Intravenous Once Hillary Bow, MD      .  0.9 %  sodium chloride infusion   Intravenous Continuous Theodoro Grist, MD      . 0.9 %  sodium chloride infusion   Intravenous Continuous Jonathon Bellows, MD      . Doug Sou Hold] albuterol (PROVENTIL) (2.5 MG/3ML) 0.083% nebulizer solution 2.5 mg  2.5 mg Nebulization Q2H PRN Hillary Bow, MD      . Doug Sou Hold] ALPRAZolam Duanne Moron) tablet 0.5 mg  0.5 mg Oral TID PRN Hillary Bow, MD   0.5 mg at 09/16/16 0110  . [MAR Hold] carvedilol (COREG) tablet 3.125 mg  3.125 mg Oral BID WC Theodoro Grist, MD   3.125 mg at 09/15/16 1702  . [MAR Hold] fentaNYL ([place] - dosed mcg/hr) 75 mcg  75 mcg  Transdermal Q72H Hillary Bow, MD   75 mcg at 09/14/16 2227  . [MAR Hold] HYDROcodone-acetaminophen (NORCO/VICODIN) 5-325 MG per tablet 1-2 tablet  1-2 tablet Oral Q4H PRN Hillary Bow, MD      . Doug Sou Hold] insulin aspart (novoLOG) injection 0-5 Units  0-5 Units Subcutaneous QHS Theodoro Grist, MD      . Doug Sou Hold] insulin aspart (novoLOG) injection 0-9 Units  0-9 Units Subcutaneous TID WC Theodoro Grist, MD   2 Units at 09/15/16 1736  . lactated ringers infusion   Intravenous Continuous Wilhelmina Mcardle, MD 50 mL/hr at 09/16/16 0107    . [MAR Hold] morphine (MSIR) tablet 15 mg  15 mg Oral Q6H PRN Hillary Bow, MD   15 mg at 09/15/16 0737  . [MAR Hold] ondansetron (ZOFRAN) injection 4 mg  4 mg Intravenous Q6H PRN Srikar Sudini, MD      . pantoprazole (PROTONIX) 80 mg in sodium chloride 0.9 % 250 mL (0.32 mg/mL) infusion  8 mg/hr Intravenous Continuous Nance Pear, MD 25 mL/hr at 09/16/16 1052 8 mg/hr at 09/16/16 1052  . [MAR Hold] pantoprazole (PROTONIX) injection 40 mg  40 mg Intravenous Q12H Nance Pear, MD      . Doug Sou Hold] polyethylene glycol (MIRALAX / GLYCOLAX) packet 17 g  17 g Oral Daily PRN Hillary Bow, MD      . Doug Sou Hold] sodium chloride flush (NS) 0.9 % injection 3 mL  3 mL Intravenous Q12H Srikar Sudini, MD   3 mL at 09/16/16 1000  . [MAR Hold] tamsulosin (FLOMAX) capsule 0.4 mg  0.4 mg Oral QPC supper Hillary Bow, MD   0.4 mg at 09/15/16 1736  . [MAR Hold] temazepam (RESTORIL) capsule 15 mg  15 mg Oral QHS PRN Gonzella Lex, MD          .  PHYSICAL EXAMINATION:  Vitals:   09/16/16 0922 09/16/16 1109  BP: 117/63 122/68  Pulse: 73 73  Resp: 16 16  Temp: 98.6 F (37 C) 99.3 F (37.4 C)   Filed Weights   09/15/16 0500 09/15/16 1428 09/16/16 0415  Weight: 180 lb 12.4 oz (82 kg) 178 lb 8 oz (81 kg) 180 lb 1.6 oz (81.7 kg)    GENERAL: Moderately nourished well-developed; Alert, no distress and comfortable.   Alone. EYES: Positive for pallor no  icterus. OROPHARYNX: no thrush or ulceration. Poor dentition NECK: supple, no masses felt LYMPH:  no palpable lymphadenopathy in the cervical, axillary or inguinal regions LUNGS: decreased breath sounds to auscultation at bases and  No wheeze or crackles HEART/CVS: regular rate & rhythm and no murmurs; No lower extremity edema ABDOMEN: abdomen soft, non-tender and normal bowel sounds Musculoskeletal:no cyanosis of digits and no clubbing  PSYCH: alert & oriented x 3  with fluent speech NEURO: no focal motor/sensory deficits SKIN:  no rashes or significant lesions  LABORATORY DATA:  I have reviewed the data as listed Lab Results  Component Value Date   WBC 6.8 09/15/2016   HGB 8.4 (L) 09/16/2016   HCT 28.5 (L) 09/15/2016   MCV 83.5 09/15/2016   PLT 277 09/15/2016    Recent Labs  06/18/16 1025 07/30/16 1322 09/10/16 1405 09/14/16 1409 09/15/16 0539 09/16/16 0547  NA 132* 133* 133* 132* 135 140  K 4.6 4.7 4.0 3.6 4.6 3.4*  CL 100* 98* 104 105 108 112*  CO2 23 18* 15* 17* 17* 20*  GLUCOSE 219* 253* 140* 142* 199* 86  BUN 17 17 12  34* 25* 18  CREATININE 1.16 1.61* 1.40* 1.63* 1.40* 1.12  CALCIUM 9.0 9.9 8.4* 7.0* 7.1* 6.8*  GFRNONAA >60 45* 53* 44* 53* >60  GFRAA >60 52* >60 51* >60 >60  PROT 6.8 7.3 7.2  --   --   --   ALBUMIN 3.4* 3.3* 3.0*  --   --   --   AST 19 40 29  --   --   --   ALT 16* 25 21  --   --   --   ALKPHOS 93 121 186*  --   --   --   BILITOT 0.3 0.6 1.2  --   --   --     RADIOGRAPHIC STUDIES: I have personally reviewed the radiological images as listed and agreed with the findings in the report. No results found.  ASSESSMENT & PLAN:  # 61 year old male patient with metastatic castrate resistant prostate cancer is currently in the hospital for GI bleed  # Metastatic castrate resistant prostate cancer- status post multiple lines of therapy- most recently progressed on Taxotere approximately 4 months ago. Patient's most recent PSA is 1000/PSA doubling  time approximately 2 months. Patient seems to have aggressive course of his prostate cancer. I long discussion the patient regarding overall poor prognosis. The chemotherapy option includes- cabazitaxel- which could be offered if patient's clinical status improves/ performance status improves. However on significant a concern regarding the general decline in the patient's condition was last few months [renal insufficiency; increasing pain; rapidly doubling PSA]. Patient would be at high risk for side effects of chemotherapy-including hospitalizations infections worsening renal failure. I had a long discussion again regarding palliative care/hospice. Patient is open to the idea of hospice; however not made any decisions yet.   # GI bleed/severe anemia- await upper and lower endoscopy. I suspect patient also has a component of involvement of the bone marrow by prostate cancer causing severe anemia.   # Also discussed DNR/DNI- patient expressed wishes Not to institute aggressive measures if his condition worsens.   however he has not made any decisions at.  # Discussed with Dr. Ether Griffins. Also discussed with palliative care RN, Meghan.   Thank you Dr.Vaickute  for allowing me to participate in the care of your pleasant patient. Please do not hesitate to contact me with questions or concerns in the interim.    All questions were answered. The patient knows to call the clinic with any problems, questions or concerns.    Cammie Sickle, MD 09/16/2016 11:52 AM

## 2016-09-16 NOTE — Transfer of Care (Signed)
Immediate Anesthesia Transfer of Care Note  Patient: Sean Petersen  Procedure(s) Performed: Procedure(s): ESOPHAGOGASTRODUODENOSCOPY (EGD) WITH PROPOFOL (N/A) COLONOSCOPY WITH PROPOFOL (N/A)  Patient Location: PACU  Anesthesia Type:General  Level of Consciousness: awake and patient cooperative  Airway & Oxygen Therapy: Patient Spontanous Breathing and Patient connected to nasal cannula oxygen  Post-op Assessment: Report given to RN and Post -op Vital signs reviewed and stable  Post vital signs: Reviewed and stable  Last Vitals:  Vitals:   09/16/16 1109 09/16/16 1225  BP: 122/68 (P) 108/67  Pulse: 73 (P) 75  Resp: 16 (P) 16  Temp: 37.4 C (P) 37.3 C    Last Pain:  Vitals:   09/16/16 1225  TempSrc: (P) Tympanic  PainSc:       Patients Stated Pain Goal: 2 (36/14/43 1540)  Complications: No apparent anesthesia complications

## 2016-09-16 NOTE — Anesthesia Postprocedure Evaluation (Signed)
Anesthesia Post Note  Patient: MILUS FRITZE  Procedure(s) Performed: Procedure(s) (LRB): ESOPHAGOGASTRODUODENOSCOPY (EGD) WITH PROPOFOL (N/A) COLONOSCOPY WITH PROPOFOL (N/A)  Patient location during evaluation: PACU Anesthesia Type: General Level of consciousness: awake Pain management: pain level controlled Vital Signs Assessment: post-procedure vital signs reviewed and stable Respiratory status: nonlabored ventilation Cardiovascular status: stable Anesthetic complications: no     Last Vitals:  Vitals:   09/16/16 1245 09/16/16 1255  BP: 124/84 131/73  Pulse: 80 78  Resp: 16 16  Temp:      Last Pain:  Vitals:   09/16/16 1225  TempSrc: Tympanic  PainSc:                  VAN STAVEREN,Dieter Hane

## 2016-09-17 ENCOUNTER — Telehealth: Payer: Self-pay | Admitting: *Deleted

## 2016-09-17 ENCOUNTER — Encounter: Payer: Self-pay | Admitting: Gastroenterology

## 2016-09-17 DIAGNOSIS — N189 Chronic kidney disease, unspecified: Secondary | ICD-10-CM

## 2016-09-17 DIAGNOSIS — R4589 Other symptoms and signs involving emotional state: Secondary | ICD-10-CM

## 2016-09-17 DIAGNOSIS — F329 Major depressive disorder, single episode, unspecified: Secondary | ICD-10-CM

## 2016-09-17 DIAGNOSIS — Z9889 Other specified postprocedural states: Secondary | ICD-10-CM

## 2016-09-17 DIAGNOSIS — K922 Gastrointestinal hemorrhage, unspecified: Secondary | ICD-10-CM

## 2016-09-17 DIAGNOSIS — N289 Disorder of kidney and ureter, unspecified: Secondary | ICD-10-CM

## 2016-09-17 DIAGNOSIS — I959 Hypotension, unspecified: Secondary | ICD-10-CM

## 2016-09-17 DIAGNOSIS — J9621 Acute and chronic respiratory failure with hypoxia: Secondary | ICD-10-CM

## 2016-09-17 DIAGNOSIS — J690 Pneumonitis due to inhalation of food and vomit: Secondary | ICD-10-CM

## 2016-09-17 DIAGNOSIS — R0602 Shortness of breath: Secondary | ICD-10-CM

## 2016-09-17 LAB — HEMOGLOBIN: Hemoglobin: 8.9 g/dL — ABNORMAL LOW (ref 13.0–18.0)

## 2016-09-17 LAB — GLUCOSE, CAPILLARY
Glucose-Capillary: 120 mg/dL — ABNORMAL HIGH (ref 65–99)
Glucose-Capillary: 115 mg/dL — ABNORMAL HIGH (ref 65–99)

## 2016-09-17 LAB — SURGICAL PATHOLOGY

## 2016-09-17 MED ORDER — HYDROCORTISONE ACETATE 25 MG RE SUPP: 25.0000 mg | Freq: Two times a day (BID) | RECTAL | 3 refills | Status: AC

## 2016-09-17 MED ORDER — MIRTAZAPINE 15 MG PO TABS: 15.0000 mg | ORAL_TABLET | Freq: Every day | ORAL | 3 refills | Status: AC

## 2016-09-17 MED ORDER — PANTOPRAZOLE SODIUM 40 MG PO TBEC: 40.0000 mg | DELAYED_RELEASE_TABLET | Freq: Every day | ORAL | 3 refills | Status: AC

## 2016-09-17 MED ORDER — ENSURE ENLIVE PO LIQD
237.0000 mL | Freq: Two times a day (BID) | ORAL | Status: DC
  Administered 2016-09-17 (×2): 237 mL via ORAL

## 2016-09-17 MED ORDER — ENSURE ENLIVE PO LIQD: 237.0000 mL | Freq: Two times a day (BID) | ORAL | 12 refills | Status: AC

## 2016-09-17 MED ORDER — ATENOLOL 100 MG PO TABS: 50.0000 mg | ORAL_TABLET | Freq: Every day | ORAL | 3 refills | Status: AC

## 2016-09-17 MED ORDER — ALBUTEROL SULFATE (2.5 MG/3ML) 0.083% IN NEBU
2.5000 mg | INHALATION_SOLUTION | RESPIRATORY_TRACT | Status: DC
  Administered 2016-09-17: 08:00:00 2.5 mg via RESPIRATORY_TRACT
  Filled 2016-09-17: qty 3

## 2016-09-17 MED ORDER — AMOXICILLIN-POT CLAVULANATE 875-125 MG PO TABS: 1.0000 | ORAL_TABLET | Freq: Two times a day (BID) | ORAL | 0 refills | Status: DC

## 2016-09-17 NOTE — Progress Notes (Deleted)
Called diet ian due to poor intake she is stopping by to see patient

## 2016-09-17 NOTE — Progress Notes (Signed)
Sean Petersen   DOB:01/22/1956   TM#:[id number]    Subjective: Patient status post upper and lower endoscopy yesterday. Patient had some desaturations yesterday needing oxygen. Chest x-Biel questioned pneumonia versus atelectasis. No fever no chills no cough. Patient walks to the bathroom without any significant difficulty. He states he is trying to wean himself. DENIES ANY cough or hemoptysis. Objective:  Vitals:   09/16/16 2029 09/17/16 0512  BP: 114/64 129/77  Pulse: 71 71  Resp: 20 18  Temp: 98.7 F (37.1 C) 98.6 F (37 C)     Intake/Output Summary (Last 24 hours) at 09/17/16 0824 Last data filed at 09/16/16 2000  Gross per 24 hour  Intake              620 ml  Output                0 ml  Net              620 ml    GENERAL Alert, no distress and comfortable. Laying in the bed. EYES: positive for pallor; no  icterus OROPHARYNX: no thrush or ulceration. NECK: supple, no masses felt LYMPH:  no palpable lymphadenopathy in the cervical, axillary or inguinal regions LUNGS: decreased breath sounds to auscultation at bases and  No wheeze or crackles HEART/CVS: regular rate & rhythm and no murmurs; No lower extremity edema ABDOMEN: abdomen soft, tender  on deep palpation. and normal bowel sounds Musculoskeletal:no cyanosis of digits and no clubbing  PSYCH: alert & oriented x 3 with fluent speech NEURO: no focal motor/sensory deficits SKIN:  no rashes or significant lesions   Labs:  Lab Results  Component Value Date   WBC 6.8 09/15/2016   HGB 8.9 (L) 09/17/2016   HCT 28.5 (L) 09/15/2016   MCV 83.5 09/15/2016   PLT 277 09/15/2016   NEUTROABS 3.4 09/14/2016    Lab Results  Component Value Date   NA 140 09/16/2016   K 3.4 (L) 09/16/2016   CL 112 (H) 09/16/2016   CO2 20 (L) 09/16/2016    Studies:  Dg Chest Port 1 View  Result Date: 09/16/2016 CLINICAL DATA:  male with past medical history significant for metastatic prostate carcinoma, essential hypertension, chronic pain,  who presents to the hospital with complaints of hypotension with systolic blood pressure in 80s. Acute respiratory failure with hypoxia. EXAM: PORTABLE CHEST 1 VIEW COMPARISON:  07/02/2014 FINDINGS: There is focal opacity in the right lower lobe new since the prior exam. Additional similar opacity is suggested in the retrocardiac region of the left lower lobe. Remainder of the lungs is clear. No convincing pleural effusion.  No pneumothorax. Cardiac silhouette is normal in size. No mediastinal or hilar masses. Skeletal structures are intact. IMPRESSION: 1. Right lung base airspace opacity, with additional opacity suggested in the medial left lower lobe, retrocardiac region. Bilateral lower lobe pneumonia suspected. 2. No evidence of pulmonary edema. Electronically Signed   By: Lajean Manes M.D.   On: 09/16/2016 15:39    Assessment & Plan:   61 year old male patient with metastatic prostate cancer admitted to the hospital for severe anemia  # Castrate resistant prostate cancer- status post multiple lines of therapy. Discussed hospice versus continuing palliative chemotherapy. We will reevaluate the patient in approximately week- and plan chemotherapy if patient clinical condition continues to improve. Otherwise proceed with palliative care/hospice.   # Severe anemia- hemoglobin around 5.5. Status post PRBC transfusion around 8 now. EGD colonoscopy negative for bleeding source. I suspect anemia-  part secondary to infiltration of bone marrow by the malignancy. I would not recommend bone marrow biopsy- as this would not change the current plan of care.  # Acute renal insufficiency/likely dehydration improved.  # Hypoxia post EGD/colonoscopy- improving. Chest x-Perdue- atelectasis. Discussed/reviewed with the patient importance of ambulation/spirometry.  # Disposition- patient can potentially be discharged today is stable from Hospitalist standpoint. Follow-up with me in 1 week. Discussed with pt; he agrees.    Cammie Sickle, MD 09/17/2016  8:24 AM

## 2016-09-17 NOTE — Progress Notes (Signed)
Received MD order to discharge patient to home reviewed home meds, prescriptions and discharge instructions with patient and patient verbalized understanding.   

## 2016-09-17 NOTE — Discharge Summary (Signed)
[facility] NAME: Sean Petersen    MR#:  [id number]  DATE OF BIRTH:  September 06, 1955  DATE OF ADMISSION:  09/14/2016 ADMITTING PHYSICIAN: Hillary Bow, MD  DATE OF DISCHARGE: No discharge date for patient encounter.  PRIMARY CARE PHYSICIAN: HANDE,VISHWANATH, MD     ADMISSION DIAGNOSIS:  Anemia, unspecified type [D64.9]  DISCHARGE DIAGNOSIS:  Principal Problem:   Grief Active Problems:   Anemia   Prostate cancer metastatic to bone [facility])   Palliative care by specialist   Goals of care, counseling/discussion   Protein-calorie malnutrition, severe   Gastrointestinal bleeding   History of esophagogastroduodenoscopy (EGD)   S/P colonoscopy   Acute on chronic renal insufficiency   Hypotension   Prostate carcinoma (HCC)   Acute on chronic respiratory failure with hypoxia (HCC)   Aspiration pneumonitis (HCC)   Depressed mood   SECONDARY DIAGNOSIS:   Past Medical History:  Diagnosis Date  . Anxiety   . Cancer [facility])    states he had cancer in his left forearm but is unaware of type and was told his body took care of it  . Chronic kidney disease    stage 3  . Depression   . Essential hypertension, benign   . Family history of adverse reaction to anesthesia    adopted  . Myocardial infarction ([place])   . Obesity   . Other and unspecified hyperlipidemia   . PONV (postoperative nausea and vomiting)    nausea  . Prostate cancer ([place])   . Type II or unspecified type diabetes mellitus without mention of complication, uncontrolled    Metformin  . Varicosities     .pro HOSPITAL COURSE:   The patient is a 61 year old male with past medical history significant for metastatic prostate carcinoma, essential hypertension, chronic pain syndrome, who presented to the hospital with complaints of anemia and hypotension. The patient's hemoglobin level was found to be 5.7. Systolic blood pressure was 70. Patient reported having  some black looking stool, bleeding rectally for the past few days. He was on nonsteroidal anti-inflammatory medications as well. Patient was admitted to the hospital for further evaluation, he was transported spectral blood cells after which hemoglobin level has improved. Patient was seen by gastroenterologist, underwent EGD as well as colonoscopy which were unremarkable, but ascending colon polyp, which was resected and treated, internal hemorrhoids. Hemorrhoidal care was recommended, follow-up with primary care physician and the gastroenterologist as outpatient. Patient was evaluated by oncologist, who felt that his anemia  in part is due to infiltration of bone marrow by malignancy, he did not recommend, however, bone marrow biopsy due to no changes in plan of care. The rest of gastric resistant prostate cancer, he recommended hospice care versus palliative chemotherapy, he recommended to follow-up with him as outpatient within 1 week after discharge. This conservative therapy, patient's condition improved, the pressure normalized. Acute on chronic renal insufficiency, which was noted on admission, resolved. Patient was noted to be in depressed mood, he was initiated on amiodarone upon discharge. He was evaluated by palliative care and recommended palliative care. Follow up as outpatient.  Discussion by problem: #1. 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 Vicente Males.  #2. Gastrointestinal bleeding, status post colonoscopy in September 2016 by Dr. 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 Vicente Males, revealing ascending colon polyp, which was resected and retrieved, internal hemorrhoids, hemorrhoidal care was recommended, the patient was initiated on   Anusol twice a day, improved clinically, H. pylori stool antigen is pending, HIV was nonreactive #3. Hypotension, resolved with transfusion and IV fluid administration, remains stable #4. Acute on chronic renal insufficiency with CK D stage II,  resolved with hydration #5. Metastatic prostate cancer, patient was seen by palliative care , outpatient palliative care follow-up was recommended, he was seen by oncologist, who felt that patient had aggressive course of his prostate cancer, he discussed with her patient poor prognosis and recommended palliative care versus palliation chemotherapy. Patient was advised to follow-up with Dr. Rogue Bussing, his oncologist in the office. . Discussed with Dr. Rogue Bussing today again. #6. Acute respiratory failure with hypoxia requiring 4 L of oxygen, due to aspiration during EGD/colonoscopy, now weaned off oxygen therapy, initiate patient on Augmentin, to be continued for 7 days to complete course for aspiration pneumonitis. #7. Depressed mood, Remeron is initiated, advance it as needed  DISCHARGE CONDITIONS:   Stable  CONSULTS OBTAINED:  Treatment Team:  Gonzella Lex, MD Cammie Sickle, MD  DRUG ALLERGIES:   Allergies  Allergen Reactions  . Codeine Sulfate Nausea And Vomiting  . Prednisone Nausea And Vomiting    DISCHARGE MEDICATIONS:   Current Discharge Medication List    START taking these medications   Details  amoxicillin-clavulanate (AUGMENTIN) 875-125 MG tablet Take 1 tablet by mouth 2 (two) times daily. Qty: 14 tablet, Refills: 0    feeding supplement, ENSURE ENLIVE, (ENSURE ENLIVE) LIQD Take 237 mLs by mouth 2 (two) times daily between meals. Qty: 237 mL, Refills: 12    hydrocortisone (ANUSOL-HC) 25 MG suppository Place 1 suppository (25 mg total) rectally 2 (two) times daily. Qty: 60 suppository, Refills: 3    mirtazapine (REMERON) 15 MG tablet Take 1 tablet (15 mg total) by mouth at bedtime. Qty: 30 tablet, Refills: 3      pantoprazole (PROTONIX) 40 MG tablet Take 1 tablet (40 mg total) by mouth daily. Qty: 30 tablet, Refills: 3      CONTINUE these medications which have CHANGED   Details  atenolol (TENORMIN) 100 MG tablet Take 0.5 tablets (50 mg total) by mouth daily. Qty: 15 tablet, Refills: 3      CONTINUE these medications which have NOT CHANGED   Details  ALPRAZolam (XANAX) 0.5 MG tablet Take 0.5 mg by mouth 2 (two) times daily as needed for sleep.    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 Take it along with immodium Qty: 60 tablet, Refills: 0    fentaNYL (DURAGESIC - DOSED MCG/HR) 75 MCG/HR Place 1 patch (75 mcg total) onto the skin every 3 (three) days. Qty: 5 patch, Refills: 0    isosorbide mononitrate (IMDUR) 30 MG 24 hr tablet Take 30 mg by mouth daily.    metFORMIN (GLUCOPHAGE) 1000 MG tablet Take 1,000 mg by mouth daily with breakfast.     morphine (MSIR) 15 MG tablet Take 1 tablet (15 mg total) by mouth every 6 (six) hours as needed for severe pain. Qty: 90 tablet, Refills: 0   Associated Diagnoses: Malignant neoplasm of prostate ([place])    simvastatin (ZOCOR) 40 MG tablet Take 40 mg by mouth every evening.  tamsulosin (FLOMAX) 0.4 MG CAPS capsule Take 1 capsule (0.4 mg total) by mouth daily after supper. Qty: 30 capsule, Refills: 6    fentaNYL (DURAGESIC - DOSED MCG/HR) 25 MCG/HR patch Place 1 patch (25 mcg total) onto the skin every 3 (three) days. Qty: 10 patch, Refills: 0      STOP taking these medications     amLODipine (NORVASC) 10 MG tablet      lisinopril (PRINIVIL,ZESTRIL) 20 MG tablet      naproxen (NAPROSYN) 500 MG tablet      glimepiride (AMARYL) 4 MG tablet          DISCHARGE INSTRUCTIONS:    The patient is to follow-up with primary care physician, oncologist as  outpatient  If you experience worsening of your admission symptoms, develop shortness of breath, life threatening emergency, suicidal or homicidal thoughts you must seek medical attention immediately by calling 911 or calling your MD immediately  if symptoms less severe.  You Must read complete instructions/literature along with all the possible adverse reactions/side effects for all the Medicines you take and that have been prescribed to you. Take any new Medicines after you have completely understood and accept all the possible adverse reactions/side effects.   Please note  You were cared for by a hospitalist during your hospital stay. If you have any questions about your discharge medications or the care you received while you were in the hospital after you are discharged, you can call the unit and asked to speak with the hospitalist on call if the hospitalist that took care of you is not available. Once you are discharged, your primary care physician will handle any further medical issues. Please note that NO REFILLS for any discharge medications will be authorized once you are discharged, as it is imperative that you return to your primary care physician (or establish a relationship with a primary care physician if you do not have one) for your aftercare needs so that they can reassess your need for medications and monitor your lab values.    Today   CHIEF COMPLAINT:   Chief Complaint  Patient presents with  . Anemia    HISTORY OF PRESENT ILLNESS:  Sean Petersen  is a 61 y.o. male with a known history of metastatic prostate carcinoma, essential hypertension, chronic pain syndrome, who presented to the hospital with complaints of anemia and hypotension. The patient's hemoglobin level was found to be 5.7. Systolic blood pressure was 70. Patient reported having some black looking stool, bleeding rectally for the past few days. He was on nonsteroidal anti-inflammatory medications as well.  Patient was admitted to the hospital for further evaluation, he was transported spectral blood cells after which hemoglobin level has improved. Patient was seen by gastroenterologist, underwent EGD as well as colonoscopy which were unremarkable, but ascending colon polyp, which was resected and treated, internal hemorrhoids. Hemorrhoidal care was recommended, follow-up with primary care physician and the gastroenterologist as outpatient. Patient was evaluated by oncologist, who felt that his anemia  in part is due to infiltration of bone marrow by malignancy, he did not recommend, however, bone marrow biopsy due to no changes in plan of care. The rest of gastric resistant prostate cancer, he recommended hospice care versus palliative chemotherapy, he recommended to follow-up with him as outpatient within 1 week after discharge. This conservative therapy, patient's condition improved, the pressure normalized. Acute on chronic renal insufficiency, which was noted on admission, resolved. Patient was noted to be in depressed mood, he was  initiated on amiodarone upon discharge. He was evaluated by palliative care and recommended palliative care. Follow up as outpatient.  Discussion by problem: #1. 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 Vicente Males.  #2. Gastrointestinal bleeding, status post colonoscopy in September 2016 by Dr. 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 Vicente Males, revealing ascending colon polyp, which was resected and retrieved, internal hemorrhoids, hemorrhoidal care was recommended, the patient was initiated on  Anusol twice a day, improved clinically, H. pylori stool antigen is pending, HIV was nonreactive #3. Hypotension, resolved with  transfusion and IV fluid administration, remains stable #4. Acute on chronic renal insufficiency with CK D stage II,  resolved with hydration #5. Metastatic prostate cancer, patient was seen by palliative care , outpatient palliative care follow-up was recommended, he was seen by oncologist, who felt that patient had aggressive course of his prostate cancer, he discussed with her patient poor prognosis and recommended palliative care versus palliation chemotherapy. Patient was advised to follow-up with Dr. Rogue Bussing, his oncologist in the office. . Discussed with Dr. Rogue Bussing today again. #6. Acute respiratory failure with hypoxia requiring 4 L of oxygen, due to aspiration during EGD/colonoscopy, now weaned off oxygen therapy, initiate patient on Augmentin, to be continued for 7 days to complete course for aspiration pneumonitis. #7. Depressed mood, Remeron is initiated, advance it as needed    VITAL SIGNS:  Blood pressure 126/70, pulse 78, temperature 98.5 F (36.9 C), temperature source Oral, resp. rate 16, height '5\' 8"'$  (1.727 m), weight 81.7 kg (180 lb 1.6 oz), SpO2 96 %.  I/O:   Intake/Output Summary (Last 24 hours) at 09/17/16 1355 Last data filed at 09/17/16 1013  Gross per 24 hour  Intake              360 ml  Output                0 ml  Net              360 ml    PHYSICAL EXAMINATION:  GENERAL:  61 y.o.-year-old patient lying in the bed with no acute distress.  EYES: Pupils equal, round, reactive to light and accommodation. No scleral icterus. Extraocular muscles intact.  HEENT: Head atraumatic, normocephalic. Oropharynx and nasopharynx clear.  NECK:  Supple, no jugular venous distention. No thyroid enlargement, no tenderness.  LUNGS: Normal breath sounds bilaterally, no wheezing, rales,rhonchi or crepitation. No use of accessory muscles of respiration.  CARDIOVASCULAR: S1, S2 normal. No murmurs, rubs, or gallops.  ABDOMEN: Soft, non-tender, non-distended. Bowel sounds present.  No organomegaly or mass.  EXTREMITIES: No pedal edema, cyanosis, or clubbing.  NEUROLOGIC: Cranial nerves II through XII are intact. Muscle strength 5/5 in all extremities. Sensation intact. Gait not checked.  PSYCHIATRIC: The patient is alert and oriented x 3.  SKIN: No obvious rash, lesion, or ulcer.   DATA REVIEW:   CBC  Recent Labs Lab 09/15/16 0539  09/17/16 0428  WBC 6.8  --   --   HGB 9.5*  < > 8.9*  HCT 28.5*  --   --   PLT 277  --   --   < > = values in this interval not displayed.  Chemistries   Recent Labs Lab 09/10/16 1405  09/16/16 0547  NA 133*  < >  140  K 4.0  < > 3.4*  CL 104  < > 112*  CO2 15*  < > 20*  GLUCOSE 140*  < > 86  BUN 12  < > 18  CREATININE 1.40*  < > 1.12  CALCIUM 8.4*  < > 6.8*  AST 29  --   --   ALT 21  --   --   ALKPHOS 186*  --   --   BILITOT 1.2  --   --   < > = values in this interval not displayed.  Cardiac Enzymes No results for input(s): TROPONINI in the last 168 hours.  Microbiology Results  Results for orders placed or performed during the hospital encounter of 09/14/16  MRSA PCR Screening     Status: None   Collection Time: 09/14/16  8:11 PM  Result Value Ref Range Status   MRSA by PCR NEGATIVE NEGATIVE Final    Comment:        The GeneXpert MRSA Assay (FDA approved for NASAL specimens only), is one component of a comprehensive MRSA colonization surveillance program. It is not intended to diagnose MRSA infection nor to guide or monitor treatment for MRSA infections.     RADIOLOGY:  Dg Chest Port 1 View  Result Date: 09/16/2016 CLINICAL DATA:  male with past medical history significant for metastatic prostate carcinoma, essential hypertension, chronic pain, who presents to the hospital with complaints of hypotension with systolic blood pressure in 80s. Acute respiratory failure with hypoxia. EXAM: PORTABLE CHEST 1 VIEW COMPARISON:  07/02/2014 FINDINGS: There is focal opacity in the right lower lobe new since the  prior exam. Additional similar opacity is suggested in the retrocardiac region of the left lower lobe. Remainder of the lungs is clear. No convincing pleural effusion.  No pneumothorax. Cardiac silhouette is normal in size. No mediastinal or hilar masses. Skeletal structures are intact. IMPRESSION: 1. Right lung base airspace opacity, with additional opacity suggested in the medial left lower lobe, retrocardiac region. Bilateral lower lobe pneumonia suspected. 2. No evidence of pulmonary edema. Electronically Signed   By: Lajean Manes M.D.   On: 09/16/2016 15:39    EKG:   Orders placed or performed during the hospital encounter of 04/23/15  . EKG 12 lead  . EKG 12 lead      Management plans discussed with the patient, family and they are in agreement.  CODE STATUS:     Code Status Orders        Start     Ordered   09/14/16 1705  Full code  Continuous     09/14/16 1705    Code Status History    Date Active Date Inactive Code Status Order ID Comments User Context   05/01/2015  1:52 PM 05/04/2015  5:57 PM Full Code [id number]  Hessie Knows, MD Inpatient      TOTAL TIME TAKING CARE OF THIS PATIENT: 40 minutes.    Theodoro Grist M.D on 09/17/2016 at 1:55 PM  Between 7am to 6pm - Pager - [phone]  After 6pm go to [email or web address] - password [email or web address] [facility]  Office  [phone]  CC: Primary care physician; Tracie Harrier, MD

## 2016-09-17 NOTE — Progress Notes (Signed)
Ambulated patient several times around unit on room air patients o2 Sat remained between 93-94%

## 2016-09-17 NOTE — Telephone Encounter (Signed)
Ref will be faxed - pending md signature on form

## 2016-09-17 NOTE — Telephone Encounter (Signed)
Asking fo r a Palliative Care referral to be faxed over with notes and insurance info [phone]

## 2016-09-17 NOTE — Progress Notes (Signed)
New referral for Home PALLIATIVE services received from [facility]. Patient is to discharge home today. Patient information faxed to referral thank you. Flo Shanks RN, BSN, [place] [facility] [place], [facility] [phone] c

## 2016-09-17 NOTE — Care Management (Signed)
Patient to discharge today with home palliative services.  Referral has been made to [facility] with [facility] and [facility] care of [place].  No skilled home health needs identified.  PCS list provided.

## 2016-09-18 LAB — H. PYLORI ANTIGEN, STOOL: H. PYLORI STOOL AG, EIA: NEGATIVE

## 2016-09-20 ENCOUNTER — Other Ambulatory Visit: Payer: Self-pay | Admitting: *Deleted

## 2016-09-20 ENCOUNTER — Encounter: Payer: Self-pay | Admitting: Internal Medicine

## 2016-09-20 DIAGNOSIS — C7951 Secondary malignant neoplasm of bone: Secondary | ICD-10-CM

## 2016-09-20 DIAGNOSIS — C61 Malignant neoplasm of prostate: Secondary | ICD-10-CM

## 2016-09-20 DIAGNOSIS — C771 Secondary and unspecified malignant neoplasm of intrathoracic lymph nodes: Principal | ICD-10-CM

## 2016-09-21 ENCOUNTER — Telehealth: Payer: Self-pay

## 2016-09-21 NOTE — Telephone Encounter (Signed)
-----   Message from Jonathon Bellows, MD sent at 09/19/2016  9:11 PM EDT ----- Inform  Tubular adenoma but prep was poor , needs repeat within 4-6 months, needs out patient follow up for capsule study

## 2016-09-21 NOTE — Telephone Encounter (Signed)
LVM for patient callback to schedule appointment.   Inform Tubular adenoma but prep was poor , needs repeat within 4-6 months, needs out patient follow up for capsule study

## 2016-09-24 ENCOUNTER — Inpatient Hospital Stay (HOSPITAL_BASED_OUTPATIENT_CLINIC_OR_DEPARTMENT_OTHER): Payer: BLUE CROSS/BLUE SHIELD | Admitting: Internal Medicine

## 2016-09-24 ENCOUNTER — Inpatient Hospital Stay: Payer: BLUE CROSS/BLUE SHIELD

## 2016-09-24 VITALS — BP 121/63 | HR 67 | Temp 99.6°F | Resp 18

## 2016-09-24 VITALS — BP 120/71 | HR 73 | Temp 98.8°F | Resp 18 | Ht 68.0 in | Wt 178.0 lb

## 2016-09-24 DIAGNOSIS — F17210 Nicotine dependence, cigarettes, uncomplicated: Secondary | ICD-10-CM | POA: Diagnosis not present

## 2016-09-24 DIAGNOSIS — R569 Unspecified convulsions: Secondary | ICD-10-CM | POA: Diagnosis not present

## 2016-09-24 DIAGNOSIS — I252 Old myocardial infarction: Secondary | ICD-10-CM | POA: Diagnosis not present

## 2016-09-24 DIAGNOSIS — Z7689 Persons encountering health services in other specified circumstances: Secondary | ICD-10-CM

## 2016-09-24 DIAGNOSIS — I700 Atherosclerosis of aorta: Secondary | ICD-10-CM | POA: Diagnosis not present

## 2016-09-24 DIAGNOSIS — C771 Secondary and unspecified malignant neoplasm of intrathoracic lymph nodes: Principal | ICD-10-CM

## 2016-09-24 DIAGNOSIS — C7951 Secondary malignant neoplasm of bone: Secondary | ICD-10-CM

## 2016-09-24 DIAGNOSIS — C61 Malignant neoplasm of prostate: Secondary | ICD-10-CM

## 2016-09-24 DIAGNOSIS — E785 Hyperlipidemia, unspecified: Secondary | ICD-10-CM

## 2016-09-24 DIAGNOSIS — D649 Anemia, unspecified: Secondary | ICD-10-CM

## 2016-09-24 DIAGNOSIS — Z7984 Long term (current) use of oral hypoglycemic drugs: Secondary | ICD-10-CM | POA: Diagnosis not present

## 2016-09-24 DIAGNOSIS — E669 Obesity, unspecified: Secondary | ICD-10-CM | POA: Diagnosis not present

## 2016-09-24 DIAGNOSIS — N183 Chronic kidney disease, stage 3 (moderate): Secondary | ICD-10-CM | POA: Diagnosis not present

## 2016-09-24 DIAGNOSIS — Z79899 Other long term (current) drug therapy: Secondary | ICD-10-CM | POA: Diagnosis not present

## 2016-09-24 DIAGNOSIS — I129 Hypertensive chronic kidney disease with stage 1 through stage 4 chronic kidney disease, or unspecified chronic kidney disease: Secondary | ICD-10-CM

## 2016-09-24 DIAGNOSIS — F418 Other specified anxiety disorders: Secondary | ICD-10-CM

## 2016-09-24 DIAGNOSIS — R634 Abnormal weight loss: Secondary | ICD-10-CM | POA: Diagnosis not present

## 2016-09-24 DIAGNOSIS — G893 Neoplasm related pain (acute) (chronic): Secondary | ICD-10-CM | POA: Diagnosis not present

## 2016-09-24 DIAGNOSIS — G8929 Other chronic pain: Secondary | ICD-10-CM | POA: Diagnosis not present

## 2016-09-24 DIAGNOSIS — Z5111 Encounter for antineoplastic chemotherapy: Secondary | ICD-10-CM | POA: Diagnosis present

## 2016-09-24 DIAGNOSIS — E1165 Type 2 diabetes mellitus with hyperglycemia: Secondary | ICD-10-CM

## 2016-09-24 LAB — COMPREHENSIVE METABOLIC PANEL
ALT: 13 U/L — ABNORMAL LOW (ref 17–63)
ANION GAP: 12 (ref 5–15)
AST: 29 U/L (ref 15–41)
Albumin: 2.7 g/dL — ABNORMAL LOW (ref 3.5–5.0)
Alkaline Phosphatase: 158 U/L — ABNORMAL HIGH (ref 38–126)
BUN: 7 mg/dL (ref 6–20)
CHLORIDE: 105 mmol/L (ref 101–111)
CO2: 16 mmol/L — AB (ref 22–32)
CREATININE: 1.29 mg/dL — AB (ref 0.61–1.24)
Calcium: 6.7 mg/dL — ABNORMAL LOW (ref 8.9–10.3)
GFR, EST NON AFRICAN AMERICAN: 58 mL/min — AB (ref >60)
Glucose, Bld: 119 mg/dL — ABNORMAL HIGH (ref 65–99)
Potassium: 3.5 mmol/L (ref 3.5–5.1)
SODIUM: 133 mmol/L — AB (ref 135–145)
Total Bilirubin: 1.1 mg/dL (ref 0.3–1.2)
Total Protein: 6.3 g/dL — ABNORMAL LOW (ref 6.5–8.1)

## 2016-09-24 LAB — CBC WITH DIFFERENTIAL/PLATELET
BASOS ABS: 0 10*3/uL (ref 0–0.1)
BASOS PCT: 1 %
EOS ABS: 0.1 10*3/uL (ref 0–0.7)
EOS PCT: 2 %
HCT: 24.9 % — ABNORMAL LOW (ref 40.0–52.0)
Hemoglobin: 8.5 g/dL — ABNORMAL LOW (ref 13.0–18.0)
Lymphocytes Relative: 8 %
Lymphs Abs: 0.5 10*3/uL — ABNORMAL LOW (ref 1.0–3.6)
MCH: 29 pg (ref 26.0–34.0)
MCHC: 34 g/dL (ref 32.0–36.0)
MCV: 85.4 fL (ref 80.0–100.0)
MONO ABS: 0.6 10*3/uL (ref 0.2–1.0)
MONOS PCT: 9 %
NEUTROS ABS: 4.7 10*3/uL (ref 1.4–6.5)
NEUTROS PCT: 80 %
PLATELETS: 186 10*3/uL (ref 150–440)
RBC: 2.91 MIL/uL — ABNORMAL LOW (ref 4.40–5.90)
RDW: 18.2 % — AB (ref 11.5–14.5)
WBC: 5.9 10*3/uL (ref 3.8–10.6)

## 2016-09-24 LAB — SAMPLE TO BLOOD BANK

## 2016-09-24 MED ORDER — CABAZITAXEL CHEMO INJECTION 60 MG/6ML W/DILUENT
20.0000 mg/m2 | Freq: Once | INTRAVENOUS | Status: AC
  Administered 2016-09-24: 42 mg via INTRAVENOUS
  Filled 2016-09-24: qty 4.2

## 2016-09-24 MED ORDER — DIPHENOXYLATE-ATROPINE 2.5-0.025 MG PO TABS: 1.0000 | ORAL_TABLET | Freq: Four times a day (QID) | ORAL | 0 refills | Status: AC | PRN

## 2016-09-24 MED ORDER — DIPHENHYDRAMINE HCL 50 MG/ML IJ SOLN
25.0000 mg | Freq: Once | INTRAMUSCULAR | Status: AC
  Administered 2016-09-24: 25 mg via INTRAVENOUS
  Filled 2016-09-24: qty 1

## 2016-09-24 MED ORDER — SODIUM CHLORIDE 0.9 % IV SOLN
Freq: Once | INTRAVENOUS | Status: AC
  Administered 2016-09-24: 10:00:00 via INTRAVENOUS
  Filled 2016-09-24: qty 1000

## 2016-09-24 MED ORDER — DEXAMETHASONE SODIUM PHOSPHATE 10 MG/ML IJ SOLN
10.0000 mg | Freq: Once | INTRAMUSCULAR | Status: AC
Start: 2016-09-24 — End: 2016-09-24
  Administered 2016-09-24: 10 mg via INTRAVENOUS
  Filled 2016-09-24: qty 1

## 2016-09-24 MED ORDER — FAMOTIDINE IN NACL 20-0.9 MG/50ML-% IV SOLN
20.0000 mg | Freq: Once | INTRAVENOUS | Status: AC
  Administered 2016-09-24: 20 mg via INTRAVENOUS
  Filled 2016-09-24: qty 50

## 2016-09-24 MED ORDER — HEPARIN SOD (PORK) LOCK FLUSH 100 UNIT/ML IV SOLN: 500.0000 [IU] | Freq: Once | INTRAVENOUS | Status: DC | PRN

## 2016-09-24 MED ORDER — PEGFILGRASTIM 6 MG/0.6ML ~~LOC~~ PSKT
6.0000 mg | PREFILLED_SYRINGE | Freq: Once | SUBCUTANEOUS | Status: AC
  Administered 2016-09-24: 6 mg via SUBCUTANEOUS
  Filled 2016-09-24: qty 0.6

## 2016-09-24 MED ORDER — SODIUM CHLORIDE 0.9 % IV SOLN: 10.0000 mg | Freq: Once | INTRAVENOUS | Status: DC

## 2016-09-24 NOTE — Progress Notes (Signed)
[place] OFFICE PROGRESS NOTE  Patient Care Team: Tracie Harrier, MD as PCP - General (Internal Medicine)   SUMMARY OF ONCOLOGIC HISTORY:  Oncology History   # 2014- PROSTATE CA [s/p Bx-Gleason score 5+4; s/p EBRT [Dr.Crystal];July 2015- Biochem Relapse [PSA-15]; non-compliance with Lupron; JAN 2016-PSA 50   # MAY 2016- PSA-100/Bone scan- Bone mets [s/p EBRT Right Hip]; CT A/P- PELVIC/RP LN;   # July 2016- ? CASTRATE RESISTANT START ZYTIGA + Prednisone; AUG PSA 3.2; OCT 2016- CT- Improved Pelvic/RP LN ~10-17MM; Multiple bone lesions;OCT 2016-Bone scan- Neg for progressive mets.OCT 2016- 1.01   # AUG 2017- PSA 43/bone scan- worse/CT- stable/improved- START Taxotere q 3W x 6 cycles. 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  Patient was recently admitted to the hospital about 2 weeks ago because of severe anemia hemoglobin 5 and hypotension. EGD negative for any active bleeding. Patient was given IV fluids. Also received blood transfusion.   Patient states his pain is under control on the fentanyl patch. Is also taking breakthrough pain medication.  Positive for weight loss. Denies any tingling and numbness. Denies and swelling in legs. HE IS ACCOMPANIED BY HIS SISTER TODAY. OVERALL FEELS OKAY.    REVIEW OF SYSTEMS:   A complete 10 point review of system is done which is negative except mentioned above/history of present illness.   PAST MEDICAL HISTORY :  Past Medical History:  Diagnosis Date  . Anxiety   . Cancer [facility])    states he had cancer in his left forearm but is unaware of type and was told his body took care of it  . Chronic kidney disease    stage 3  . Depression   . Essential hypertension, benign   . Family history of adverse reaction to anesthesia    adopted  . Myocardial infarction ([place])   . Obesity   . Other and unspecified hyperlipidemia   . PONV (postoperative nausea and vomiting)    nausea  . Prostate cancer ([place])   . Type II or unspecified type diabetes mellitus without mention of complication, uncontrolled    Metformin  . Varicosities     PAST SURGICAL HISTORY :   Past Surgical History:  Procedure Laterality Date  . CARDIAC CATHETERIZATION    . COLONOSCOPY  2013  . COLONOSCOPY WITH PROPOFOL N/A 01/23/2015   Procedure: COLONOSCOPY WITH PROPOFOL;  Surgeon: Hulen Luster, MD;  Location: [facility] ENDOSCOPY;  Service: Gastroenterology;  Laterality: N/A;  . COLONOSCOPY WITH PROPOFOL N/A 09/16/2016   Procedure: COLONOSCOPY WITH PROPOFOL;  Surgeon: Jonathon Bellows, MD;  Location: [facility] ENDOSCOPY;  Service: Endoscopy;  Laterality: N/A;  . ESOPHAGOGASTRODUODENOSCOPY (EGD) WITH PROPOFOL N/A 09/16/2016   Procedure: ESOPHAGOGASTRODUODENOSCOPY (EGD) WITH PROPOFOL;  Surgeon: Jonathon Bellows, MD;  Location: [facility] ENDOSCOPY;  Service: Endoscopy;  Laterality: N/A;  .  INTRAOPERATIVE ARTERIOGRAM     for vascular disease  . TONSILLECTOMY    . TOTAL HIP ARTHROPLASTY Right 05/01/2015   Procedure: TOTAL HIP ARTHROPLASTY ANTERIOR APPROACH;  Surgeon: Hessie Knows, MD;  Location: [facility] ORS;  Service: Orthopedics;  Laterality: Right;    FAMILY HISTORY :   Family History  Problem Relation Age of Onset  . Adopted: Yes    SOCIAL HISTORY:   Social History  Substance Use Topics  . Smoking status: Current Every Day  Smoker    Packs/day: 0.50    Types: Cigarettes  . Smokeless tobacco: Never Used  . Alcohol use 0.0 oz/week     Comment: 28 12 ounce beers per week    ALLERGIES:  is allergic to codeine sulfate and prednisone.  MEDICATIONS:  Current Outpatient Prescriptions  Medication Sig Dispense Refill  . ALPRAZolam (XANAX) 0.5 MG tablet Take 0.5 mg by mouth 2 (two) times daily as needed for sleep.     Marland Kitchen amoxicillin-clavulanate (AUGMENTIN) 875-125 MG tablet Take 1 tablet by mouth 2 (two) times daily. 14 tablet 0  . atenolol (TENORMIN) 100 MG tablet Take 0.5 tablets (50 mg total) by mouth daily. 15 tablet 3  . feeding supplement, ENSURE ENLIVE, (ENSURE ENLIVE) LIQD Take 237 mLs by mouth 2 (two) times daily between meals. 237 mL 12  . fentaNYL (DURAGESIC - DOSED MCG/HR) 25 MCG/HR patch Place 1 patch (25 mcg total) onto the skin every 3 (three) days. 10 patch 0  . fentaNYL (DURAGESIC - DOSED MCG/HR) 75 MCG/HR Place 1 patch (75 mcg total) onto the skin every 3 (three) days. 5 patch 0  . hydrocortisone (ANUSOL-HC) 25 MG suppository Place 1 suppository (25 mg total) rectally 2 (two) times daily. 60 suppository 3  . isosorbide mononitrate (IMDUR) 30 MG 24 hr tablet Take 30 mg by mouth daily.    . metFORMIN (GLUCOPHAGE) 1000 MG tablet Take 1,000 mg by mouth daily with breakfast.     . mirtazapine (REMERON) 15 MG tablet Take 1 tablet (15 mg total) by mouth at bedtime. 30 tablet 3  . morphine (MSIR) 15 MG tablet Take 1 tablet (15 mg total) by mouth every 6 (six) hours as needed for severe pain. 90 tablet 0  . simvastatin (ZOCOR) 40 MG tablet Take 40 mg by mouth every evening.    . tamsulosin (FLOMAX) 0.4 MG CAPS capsule Take 1 capsule (0.4 mg total) by mouth daily after supper. 30 capsule 6  . diphenoxylate-atropine (LOMOTIL) 2.5-0.025 MG tablet Take 1 tablet by mouth 4 (four) times daily as needed for diarrhea or loose stools. Take it along with immodium 60 tablet 0  . pantoprazole (PROTONIX) 40 MG tablet Take 1  tablet (40 mg total) by mouth daily. (Patient not taking: Reported on 09/24/2016) 30 tablet 3   No current facility-administered medications for this visit.     PHYSICAL EXAMINATION: ECOG PERFORMANCE STATUS: 2 - Symptomatic, <50% confined to bed  BP 120/71 (BP Location: Left Arm, Patient Position: Sitting)   Pulse 73   Temp 98.8 F (37.1 C) (Tympanic)   Resp 18   Ht 5\' 8"  (1.727 m)   Wt 178 lb (80.7 kg)   BMI 27.06 kg/m   Filed Weights   09/24/16 0842  Weight: 178 lb (80.7 kg)    GENERAL: Well-nourished well-developed; Alert, no distress and comfortable. Patient is in a wheelchair. Accompanied by her sister; wife EYES: no pallor or icterus OROPHARYNX: no thrush or ulceration; poor dentition  NECK: supple, no masses felt LYMPH:  no palpable lymphadenopathy in the cervical, axillary or inguinal regions LUNGS: clear to auscultation and  No wheeze or crackles HEART/CVS: regular rate & rhythm and no murmurs; No lower extremity edema ABDOMEN:abdomen soft, non-tender and normal bowel sounds Musculoskeletal:no cyanosis of digits and no clubbing  PSYCH: alert & oriented x 3 with fluent speech NEURO: no focal motor/sensory deficits SKIN:  no rashes or significant lesions  LABORATORY DATA:  I have reviewed the data as listed    Component Value Date/Time   NA 133 (L) 09/24/2016 0816   K 3.5 09/24/2016 0816   CL 105 09/24/2016 0816   CO2 16 (L) 09/24/2016 0816   GLUCOSE 119 (H) 09/24/2016 0816   BUN 7 09/24/2016 0816   CREATININE 1.29 (H) 09/24/2016 0816   CALCIUM 6.7 (L) 09/24/2016 0816   PROT 6.3 (L) 09/24/2016 0816   ALBUMIN 2.7 (L) 09/24/2016 0816   AST 29 09/24/2016 0816   ALT 13 (L) 09/24/2016 0816   ALKPHOS 158 (H) 09/24/2016 0816   BILITOT 1.1 09/24/2016 0816   GFRNONAA 58 (L) 09/24/2016 0816   GFRAA >60 09/24/2016 0816    No results found for: SPEP, UPEP  Lab Results  Component Value Date   WBC 5.9 09/24/2016   NEUTROABS 4.7 09/24/2016   HGB 8.5 (L)  09/24/2016   HCT 24.9 (L) 09/24/2016   MCV 85.4 09/24/2016   PLT 186 09/24/2016      Chemistry      Component Value Date/Time   NA 133 (L) 09/24/2016 0816   K 3.5 09/24/2016 0816   CL 105 09/24/2016 0816   CO2 16 (L) 09/24/2016 0816   BUN 7 09/24/2016 0816   CREATININE 1.29 (H) 09/24/2016 0816      Component Value Date/Time   CALCIUM 6.7 (L) 09/24/2016 0816   ALKPHOS 158 (H) 09/24/2016 0816   AST 29 09/24/2016 0816   ALT 13 (L) 09/24/2016 0816   BILITOT 1.1 09/24/2016 0816     Results for AVRY, ROEDL (MRN [id number]) as of 06/25/2016 10:29  Ref. Range 10/24/2015 13:00 12/02/2015 14:40 03/04/2016 09:00 05/28/2016 11:20 06/18/2016 10:28  PSA Latest Ref Range: 0.00 - 4.00 ng/mL 24.20 (H) 43.69 (H) 68.03 (H) 262.00 (H) 355.00 (H)   IMPRESSION: 1. Multiple areas of relative increased attenuation identified in the right lobe of the liver on the previous study are not evident today. These were felt to be likely related to benign etiology previously and slight difference in bolus timing could account for lack of visualization today. 2. Stable appearance of the index lymph nodes identified previously in the abdomen and pelvis. No new or progressive lymphadenopathy on today's study. 3. Fairly widespread sclerotic bony metastases with apparent slight progression in the bony pelvis with relative stability of lesions identified in the thoracolumbar spine. 4. Interval stability of multiple lesions identified in the inferior kidneys bilaterally. Some of these have attenuation higher than would be expected for a simple cyst and while likely cysts complicated by proteinaceous debris or hemorrhage, attention on follow-up recommended. 5.  Abdominal Aortic Atherosclerois (ICD10-170.0)   Electronically Signed   By: Misty Stanley M.D.   On: 06/21/2016 13:59 ------------------------------------------------------------------------------ IMPRESSION: Progression of diffuse osseous metastatic  disease as detailed above.   Electronically Signed   By: Genia Del M.D.   On: 06/21/2016 15:33 RADIOGRAPHIC STUDIES:   ASSESSMENT & PLAN:   Prostate cancer metastatic to bone ([place]) # METASTATIC CASTRATE RESISTANT PROSTATE CANCER- progression on Taxotere. FEB 2018- Unfortunately imaging/bone scan shows progressive disease/rapidly  rising PSA ~ 700. CT scan shows no obvious visceral  metastases apart from stable lymphadenopathy. Patient obviously symptomatic with significant worsening of the pain.   # Proceed with palliative chemo with cabazitaxel  [20mg /m2] every 3 week] discussed the side effects esp- diarrhea/ neutropenia. Given new script for lomotil.   # Growth factor-Neulasta/On pro would be given as prophylaxis for chemotherapy-induced neutropenia to prevent febrile neutropenias.   # Reviewed/counselled regarding the goals of care- being palliative/treatment are usually indefinite-until progression or side effects. Goal is to maintain quality of life as the disease is incurable.  # severe anemia- Hb 5.3 s/p EGD- NEG for bleed; likley sec to prostate cancer.   # acute on CKD- creat improved at 1.2.   # Lupron q 51m- last on March 16th 2018; proceed with X-geva [4/27].   # chronic pain- sec to cancer- progressive- recommend fenatnyl 75 mcg; with morphine prn.   # weekly labs/ IVFs/ Hold tube/ 1 week MD; 3 week/Thursday- chemo.     Cammie Sickle, MD 09/28/2016 4:49 PM

## 2016-09-24 NOTE — Assessment & Plan Note (Signed)
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 FEB 2018- Unfortunately imaging/bone scan shows progressive disease/rapidly rising PSA ~ 700. CT scan shows no obvious visceral  metastases apart from stable lymphadenopathy. Patient obviously symptomatic with significant worsening of the pain.   # Proceed with palliative chemo with cabazitaxel  [20mg /m2] every 3 week] discussed the side effects esp- diarrhea/ neutropenia. Given new script for lomotil.   # Growth factor-Neulasta/On pro would be given as prophylaxis for chemotherapy-induced neutropenia to prevent febrile neutropenias.   # Reviewed/counselled regarding the goals of care- being palliative/treatment are usually indefinite-until progression or side effects. Goal is to maintain quality of life as the disease is incurable.  # severe anemia- Hb 5.3 s/p EGD- NEG for bleed; likley sec to prostate cancer.   # acute on CKD- creat improved at 1.2.   # Lupron q 38m- last on March 16th 2018; proceed with X-geva [4/27].   # chronic pain- sec to cancer- progressive- recommend fenatnyl 75 mcg; with morphine prn.   # weekly labs/ IVFs/ Hold tube/ 1 week MD; 3 week/Thursday- chemo.

## 2016-10-01 ENCOUNTER — Inpatient Hospital Stay: Payer: BLUE CROSS/BLUE SHIELD

## 2016-10-01 ENCOUNTER — Inpatient Hospital Stay (HOSPITAL_BASED_OUTPATIENT_CLINIC_OR_DEPARTMENT_OTHER): Payer: BLUE CROSS/BLUE SHIELD | Admitting: Internal Medicine

## 2016-10-01 VITALS — BP 114/75 | HR 69 | Temp 97.8°F | Resp 18

## 2016-10-01 DIAGNOSIS — C7951 Secondary malignant neoplasm of bone: Secondary | ICD-10-CM | POA: Diagnosis not present

## 2016-10-01 DIAGNOSIS — N183 Chronic kidney disease, stage 3 (moderate): Secondary | ICD-10-CM

## 2016-10-01 DIAGNOSIS — Z7689 Persons encountering health services in other specified circumstances: Secondary | ICD-10-CM

## 2016-10-01 DIAGNOSIS — Z7984 Long term (current) use of oral hypoglycemic drugs: Secondary | ICD-10-CM

## 2016-10-01 DIAGNOSIS — D649 Anemia, unspecified: Secondary | ICD-10-CM | POA: Diagnosis not present

## 2016-10-01 DIAGNOSIS — I129 Hypertensive chronic kidney disease with stage 1 through stage 4 chronic kidney disease, or unspecified chronic kidney disease: Secondary | ICD-10-CM | POA: Diagnosis not present

## 2016-10-01 DIAGNOSIS — F17210 Nicotine dependence, cigarettes, uncomplicated: Secondary | ICD-10-CM

## 2016-10-01 DIAGNOSIS — E785 Hyperlipidemia, unspecified: Secondary | ICD-10-CM

## 2016-10-01 DIAGNOSIS — I252 Old myocardial infarction: Secondary | ICD-10-CM | POA: Diagnosis not present

## 2016-10-01 DIAGNOSIS — C61 Malignant neoplasm of prostate: Secondary | ICD-10-CM

## 2016-10-01 DIAGNOSIS — C771 Secondary and unspecified malignant neoplasm of intrathoracic lymph nodes: Secondary | ICD-10-CM | POA: Diagnosis not present

## 2016-10-01 DIAGNOSIS — I700 Atherosclerosis of aorta: Secondary | ICD-10-CM

## 2016-10-01 DIAGNOSIS — Z5111 Encounter for antineoplastic chemotherapy: Secondary | ICD-10-CM | POA: Diagnosis not present

## 2016-10-01 DIAGNOSIS — E669 Obesity, unspecified: Secondary | ICD-10-CM

## 2016-10-01 DIAGNOSIS — R634 Abnormal weight loss: Secondary | ICD-10-CM | POA: Diagnosis not present

## 2016-10-01 DIAGNOSIS — Z79899 Other long term (current) drug therapy: Secondary | ICD-10-CM

## 2016-10-01 DIAGNOSIS — G893 Neoplasm related pain (acute) (chronic): Secondary | ICD-10-CM

## 2016-10-01 DIAGNOSIS — E1165 Type 2 diabetes mellitus with hyperglycemia: Secondary | ICD-10-CM

## 2016-10-01 DIAGNOSIS — F418 Other specified anxiety disorders: Secondary | ICD-10-CM | POA: Diagnosis not present

## 2016-10-01 LAB — CBC WITH DIFFERENTIAL/PLATELET
BASOS PCT: 1 %
Basophils Absolute: 0.1 10*3/uL (ref 0–0.1)
EOS ABS: 0.1 10*3/uL (ref 0–0.7)
Eosinophils Relative: 1 %
HEMATOCRIT: 24.6 % — AB (ref 40.0–52.0)
Hemoglobin: 8.5 g/dL — ABNORMAL LOW (ref 13.0–18.0)
Lymphocytes Relative: 8 %
Lymphs Abs: 0.8 10*3/uL — ABNORMAL LOW (ref 1.0–3.6)
MCH: 29 pg (ref 26.0–34.0)
MCHC: 34.6 g/dL (ref 32.0–36.0)
MCV: 83.6 fL (ref 80.0–100.0)
MONO ABS: 0.6 10*3/uL (ref 0.2–1.0)
MONOS PCT: 6 %
Neutro Abs: 9 10*3/uL — ABNORMAL HIGH (ref 1.4–6.5)
Neutrophils Relative %: 84 %
Platelets: 156 10*3/uL (ref 150–440)
RBC: 2.94 MIL/uL — ABNORMAL LOW (ref 4.40–5.90)
RDW: 18.4 % — AB (ref 11.5–14.5)
WBC: 10.6 10*3/uL (ref 3.8–10.6)

## 2016-10-01 LAB — BASIC METABOLIC PANEL
Anion gap: 7 (ref 5–15)
BUN: 13 mg/dL (ref 6–20)
CALCIUM: 7.7 mg/dL — AB (ref 8.9–10.3)
CO2: 21 mmol/L — AB (ref 22–32)
CREATININE: 1.12 mg/dL (ref 0.61–1.24)
Chloride: 107 mmol/L (ref 101–111)
GFR calc non Af Amer: >60 mL/min (ref >60)
Glucose, Bld: 158 mg/dL — ABNORMAL HIGH (ref 65–99)
Potassium: 4.1 mmol/L (ref 3.5–5.1)
SODIUM: 135 mmol/L (ref 135–145)

## 2016-10-01 LAB — SAMPLE TO BLOOD BANK

## 2016-10-01 MED ORDER — FENTANYL 25 MCG/HR TD PT72: 25.0000 ug | MEDICATED_PATCH | TRANSDERMAL | 0 refills | Status: AC

## 2016-10-01 MED ORDER — FENTANYL 75 MCG/HR TD PT72: 75.0000 ug | MEDICATED_PATCH | TRANSDERMAL | 0 refills | Status: AC

## 2016-10-01 MED ORDER — ISOSORBIDE MONONITRATE ER 30 MG PO TB24: 30.0000 mg | ORAL_TABLET | Freq: Every day | ORAL | 0 refills | Status: DC

## 2016-10-01 NOTE — Assessment & Plan Note (Addendum)
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 FEB 2018- Unfortunately imaging/bone scan shows progressive disease/rapidly rising PSA ~ 1000. CT scan shows no obvious visceral  metastases apart from stable lymphadenopathy.  # s/p palliative chemo with cabazitaxel  [20mg /m2]  Cycle #1. Appx. 1 week ago.  Patient tolerated chemotherapy extremely well;  Feels better.  #  Proceed with cycle #2 in approximately 2 weeks.  # weight loss- ~4 pounds- referral to [facility].;  Discussed regarding protein /boost-ensures.   # Anemia- improved;/stable at 8.5. Hold PRBC transfusion.   # Lupron q 79m- last on March 16th 2018;  X-geva [4/27].   # chronic pain- sec to cancer- continue fenatnyl 100 mcg [75+25]; refilled. ; with morphine prn.   # weekly labs/ IVFs/ Hold tube/ 1 week MD; 3 week; X-geva/Thursday- chemo.

## 2016-10-01 NOTE — Progress Notes (Signed)
Patient here for follow-up for metastatic prostate cancer. He reports improvement in overall pain levels today. He denies any pain today. He states that his emotional health has improved. He feels "fatigue."  Pt requesting RF on Duragesic patches. He would like a RF on Imdur. States that he is unable to go to Dr. Linton Ham office this week for RF. MD approved 30 days supply until pt can get back to pcp.

## 2016-10-01 NOTE — Progress Notes (Signed)
[place] OFFICE PROGRESS NOTE  Patient Care Team: Tracie Harrier, MD as PCP - General (Internal Medicine)   SUMMARY OF ONCOLOGIC HISTORY:  Oncology History   # 2014- PROSTATE CA [s/p Bx-Gleason score 5+4; s/p EBRT [Dr.Crystal];July 2015- Biochem Relapse [PSA-15]; non-compliance with Lupron; JAN 2016-PSA 50   # MAY 2016- PSA-100/Bone scan- Bone mets [s/p EBRT Right Hip]; CT A/P- PELVIC/RP LN;   # July 2016- ? CASTRATE RESISTANT START ZYTIGA + Prednisone; AUG PSA 3.2; OCT 2016- CT- Improved Pelvic/RP LN ~10-17MM; Multiple bone lesions;OCT 2016-Bone scan- Neg for progressive mets.OCT 2016- 1.01   # AUG 2017- PSA 43/bone scan- worse/CT- stable/improved- START Taxotere q 3W x 6 cycles. FEB 2018- CT stable pelvic LN; Bone scan- progressive lesions/ PSA rising [350--1000 [may 2018]  # MAY 11th 2018-CABAZITAXEL [20mg /m2; neulasata]  # Bone mets- X-geva;      Malignant neoplasm of prostate ([place])   02/23/2013 Initial Diagnosis    Malignant neoplasm of prostate [facility])       INTERVAL HISTORY:  62 year old male patient with Above history of metastatic prostate cancer castrate resistant currently on cabazitaxel status post cycle #1 approximately 1 week ago is here for follow-up.  Patient states that he is feeling "extremely good"- since chemotherapy. Denies any diarrhea. Denies any nausea vomiting. Denies any chills or fevers. In general appetite is good. Denies any significant pain. Patient denies any blood in stools or black stools.  Patient states his pain is under control on the fentanyl patch. Is also taking breakthrough morphine short-acting pain medication.  Positive for weight loss. Denies any tingling and numbness.  REVIEW OF SYSTEMS:  A complete 10 point review of system is done which is negative except mentioned above/history of present illness.   PAST MEDICAL HISTORY :  Past Medical History:  Diagnosis Date  . Anxiety   . Cancer [facility])    states he had  cancer in his left forearm but is unaware of type and was told his body took care of it  . Chronic kidney disease    stage 3  . Depression   . Essential hypertension, benign   . Family history of adverse reaction to anesthesia    adopted  . Myocardial infarction ([place])   . Obesity   . Other and unspecified hyperlipidemia   . PONV (postoperative nausea and vomiting)    nausea  . Prostate cancer ([place])   . Type II or unspecified type diabetes mellitus without mention of complication, uncontrolled    Metformin  . Varicosities     PAST SURGICAL HISTORY :   Past Surgical History:  Procedure Laterality Date  . CARDIAC CATHETERIZATION    . COLONOSCOPY  2013  . COLONOSCOPY WITH PROPOFOL N/A 01/23/2015   Procedure: COLONOSCOPY WITH PROPOFOL;  Surgeon: Hulen Luster, MD;  Location[facility] ENDOSCOPY;  Service: Gastroenterology;  Laterality: N/A;  . COLONOSCOPY WITH PROPOFOL N/A 09/16/2016   Procedure: COLONOSCOPY WITH PROPOFOL;  Surgeon: Jonathon Bellows, MD;  Location: [facility] ENDOSCOPY;  Service: Endoscopy;  Laterality: N/A;  . ESOPHAGOGASTRODUODENOSCOPY (EGD) WITH PROPOFOL N/A 09/16/2016   Procedure: ESOPHAGOGASTRODUODENOSCOPY (EGD) WITH PROPOFOL;  Surgeon: Jonathon Bellows, MD;  Location: [facility] ENDOSCOPY;  Service: Endoscopy;  Laterality: N/A;  . INTRAOPERATIVE ARTERIOGRAM     for vascular disease  . TONSILLECTOMY    . TOTAL HIP ARTHROPLASTY Right 05/01/2015   Procedure: TOTAL HIP ARTHROPLASTY ANTERIOR APPROACH;  Surgeon: Hessie Knows, MD;  Location: [facility] ORS;  Service: Orthopedics;  Laterality: Right;    FAMILY HISTORY :  Family History  Problem Relation Age of Onset  . Adopted: Yes    SOCIAL HISTORY:   Social History  Substance Use Topics  . Smoking status: Current Every Day Smoker    Packs/day: 0.50    Types: Cigarettes  . Smokeless tobacco: Never Used  . Alcohol use 0.0 oz/week     Comment: 28 12 ounce beers per week    ALLERGIES:  is allergic to codeine sulfate and prednisone.  MEDICATIONS:   Current Outpatient Prescriptions  Medication Sig Dispense Refill  . ALPRAZolam (XANAX) 0.5 MG tablet Take 0.5 mg by mouth 2 (two) times daily as needed for sleep.     Marland Kitchen atenolol (TENORMIN) 100 MG tablet Take 0.5 tablets (50 mg total) by mouth daily. 15 tablet 3  . diphenoxylate-atropine (LOMOTIL) 2.5-0.025 MG tablet Take 1 tablet by mouth 4 (four) times daily as needed for diarrhea or loose stools. Take it along with immodium 60 tablet 0  . feeding supplement, ENSURE ENLIVE, (ENSURE ENLIVE) LIQD Take 237 mLs by mouth 2 (two) times daily between meals. 237 mL 12  . fentaNYL (DURAGESIC - DOSED MCG/HR) 75 MCG/HR Place 1 patch (75 mcg total) onto the skin every 3 (three) days. 5 patch 0  . hydrocortisone (ANUSOL-HC) 25 MG suppository Place 1 suppository (25 mg total) rectally 2 (two) times daily. 60 suppository 3  . isosorbide mononitrate (IMDUR) 30 MG 24 hr tablet Take 1 tablet (30 mg total) by mouth daily. 30 tablet 0  . metFORMIN (GLUCOPHAGE) 1000 MG tablet Take 1,000 mg by mouth daily with breakfast.     . mirtazapine (REMERON) 15 MG tablet Take 1 tablet (15 mg total) by mouth at bedtime. 30 tablet 3  . morphine (MSIR) 15 MG tablet Take 1 tablet (15 mg total) by mouth every 6 (six) hours as needed for severe pain. 90 tablet 0  . pantoprazole (PROTONIX) 40 MG tablet Take 1 tablet (40 mg total) by mouth daily. 30 tablet 3  . simvastatin (ZOCOR) 40 MG tablet Take 40 mg by mouth every evening.    . tamsulosin (FLOMAX) 0.4 MG CAPS capsule Take 1 capsule (0.4 mg total) by mouth daily after supper. 30 capsule 6  . fentaNYL (DURAGESIC - DOSED MCG/HR) 25 MCG/HR patch Place 1 patch (25 mcg total) onto the skin every 3 (three) days. 10 patch 0   No current facility-administered medications for this visit.     PHYSICAL EXAMINATION: ECOG PERFORMANCE STATUS: 2 - Symptomatic, <50% confined to bed  BP 114/75   Pulse 69   Temp 97.8 F (36.6 C) (Tympanic)   Resp 18   There were no vitals filed for this  visit.  GENERAL: Well-nourished well-developed; Alert, no distress and comfortable.His walking by himself. Accompanied by her sister. EYES: no pallor or icterus OROPHARYNX: no thrush or ulceration; poor dentition  NECK: supple, no masses felt LYMPH:  no palpable lymphadenopathy in the cervical, axillary or inguinal regions LUNGS: clear to auscultation and  No wheeze or crackles HEART/CVS: regular rate & rhythm and no murmurs; No lower extremity edema ABDOMEN:abdomen soft, non-tender and normal bowel sounds Musculoskeletal:no cyanosis of digits and no clubbing  PSYCH: alert & oriented x 3 with fluent speech NEURO: no focal motor/sensory deficits SKIN:  no rashes or significant lesions  LABORATORY DATA:  I have reviewed the data as listed    Component Value Date/Time   NA 135 10/01/2016 0845   K 4.1 10/01/2016 0845   CL 107 10/01/2016 0845   CO2 21 (  L) 10/01/2016 0845   GLUCOSE 158 (H) 10/01/2016 0845   BUN 13 10/01/2016 0845   CREATININE 1.12 10/01/2016 0845   CALCIUM 7.7 (L) 10/01/2016 0845   PROT 6.3 (L) 09/24/2016 0816   ALBUMIN 2.7 (L) 09/24/2016 0816   AST 29 09/24/2016 0816   ALT 13 (L) 09/24/2016 0816   ALKPHOS 158 (H) 09/24/2016 0816   BILITOT 1.1 09/24/2016 0816   GFRNONAA >60 10/01/2016 0845   GFRAA >60 10/01/2016 0845    No results found for: SPEP, UPEP  Lab Results  Component Value Date   WBC 10.6 10/01/2016   NEUTROABS 9.0 (H) 10/01/2016   HGB 8.5 (L) 10/01/2016   HCT 24.6 (L) 10/01/2016   MCV 83.6 10/01/2016   PLT 156 10/01/2016      Chemistry      Component Value Date/Time   NA 135 10/01/2016 0845   K 4.1 10/01/2016 0845   CL 107 10/01/2016 0845   CO2 21 (L) 10/01/2016 0845   BUN 13 10/01/2016 0845   CREATININE 1.12 10/01/2016 0845      Component Value Date/Time   CALCIUM 7.7 (L) 10/01/2016 0845   ALKPHOS 158 (H) 09/24/2016 0816   AST 29 09/24/2016 0816   ALT 13 (L) 09/24/2016 0816   BILITOT 1.1 09/24/2016 0816     Results for PHINEAS, MCENROE (MRN [id number]) as of 06/25/2016 10:29  Ref. Range 10/24/2015 13:00 12/02/2015 14:40 03/04/2016 09:00 05/28/2016 11:20 06/18/2016 10:28  PSA Latest Ref Range: 0.00 - 4.00 ng/mL 24.20 (H) 43.69 (H) 68.03 (H) 262.00 (H) 355.00 (H)   IMPRESSION: 1. Multiple areas of relative increased attenuation identified in the right lobe of the liver on the previous study are not evident today. These were felt to be likely related to benign etiology previously and slight difference in bolus timing could account for lack of visualization today. 2. Stable appearance of the index lymph nodes identified previously in the abdomen and pelvis. No new or progressive lymphadenopathy on today's study. 3. Fairly widespread sclerotic bony metastases with apparent slight progression in the bony pelvis with relative stability of lesions identified in the thoracolumbar spine. 4. Interval stability of multiple lesions identified in the inferior kidneys bilaterally. Some of these have attenuation higher than would be expected for a simple cyst and while likely cysts complicated by proteinaceous debris or hemorrhage, attention on follow-up recommended. 5.  Abdominal Aortic Atherosclerois (ICD10-170.0)   Electronically Signed   By: Misty Stanley M.D.   On: 06/21/2016 13:59 ------------------------------------------------------------------------------ IMPRESSION: Progression of diffuse osseous metastatic disease as detailed above.   Electronically Signed   By: Genia Del M.D.   On: 06/21/2016 15:33 RADIOGRAPHIC STUDIES:   ASSESSMENT & PLAN:   Malignant neoplasm of prostate ([place]) # METASTATIC CASTRATE RESISTANT PROSTATE CANCER- progression on Taxotere. FEB 2018- Unfortunately imaging/bone scan shows progressive disease/rapidly rising PSA ~ 1000. CT scan shows no obvious visceral  metastases apart from stable lymphadenopathy.  # s/p palliative chemo with cabazitaxel  [20mg /m2]  Cycle #1. Appx. 1 week  ago.  Patient tolerated chemotherapy extremely well;  Feels better.  #  Proceed with cycle #2 in approximately 2 weeks.  # weight loss- ~4 pounds- referral to [facility].;  Discussed regarding protein /boost-ensures.   # Anemia- improved;/stable at 8.5. Hold PRBC transfusion.   # Lupron q 69m- last on March 16th 2018;  X-geva [4/27].   # chronic pain- sec to cancer- continue fenatnyl 100 mcg [75+25]; refilled. ; with morphine prn.   # weekly  labs/ IVFs/ Hold tube/ 1 week MD; 3 week; X-geva/Thursday- chemo.     Cammie Sickle, MD 10/03/2016 5:11 PM

## 2016-10-05 ENCOUNTER — Inpatient Hospital Stay: Payer: BLUE CROSS/BLUE SHIELD

## 2016-10-08 ENCOUNTER — Inpatient Hospital Stay: Payer: BLUE CROSS/BLUE SHIELD

## 2016-10-08 ENCOUNTER — Inpatient Hospital Stay (HOSPITAL_BASED_OUTPATIENT_CLINIC_OR_DEPARTMENT_OTHER): Payer: BLUE CROSS/BLUE SHIELD | Admitting: Internal Medicine

## 2016-10-08 ENCOUNTER — Inpatient Hospital Stay: Payer: BLUE CROSS/BLUE SHIELD | Admitting: Internal Medicine

## 2016-10-08 VITALS — BP 114/70 | HR 60 | Temp 96.6°F | Resp 18 | Wt 172.0 lb

## 2016-10-08 VITALS — BP 114/70 | HR 60 | Temp 96.9°F | Resp 18

## 2016-10-08 DIAGNOSIS — G893 Neoplasm related pain (acute) (chronic): Secondary | ICD-10-CM

## 2016-10-08 DIAGNOSIS — F418 Other specified anxiety disorders: Secondary | ICD-10-CM

## 2016-10-08 DIAGNOSIS — R569 Unspecified convulsions: Secondary | ICD-10-CM

## 2016-10-08 DIAGNOSIS — C7951 Secondary malignant neoplasm of bone: Principal | ICD-10-CM

## 2016-10-08 DIAGNOSIS — T451X5A Adverse effect of antineoplastic and immunosuppressive drugs, initial encounter: Principal | ICD-10-CM

## 2016-10-08 DIAGNOSIS — C61 Malignant neoplasm of prostate: Secondary | ICD-10-CM

## 2016-10-08 DIAGNOSIS — D649 Anemia, unspecified: Secondary | ICD-10-CM

## 2016-10-08 DIAGNOSIS — Z7984 Long term (current) use of oral hypoglycemic drugs: Secondary | ICD-10-CM

## 2016-10-08 DIAGNOSIS — Z7689 Persons encountering health services in other specified circumstances: Secondary | ICD-10-CM

## 2016-10-08 DIAGNOSIS — Z79899 Other long term (current) drug therapy: Secondary | ICD-10-CM

## 2016-10-08 DIAGNOSIS — E669 Obesity, unspecified: Secondary | ICD-10-CM

## 2016-10-08 DIAGNOSIS — C771 Secondary and unspecified malignant neoplasm of intrathoracic lymph nodes: Secondary | ICD-10-CM | POA: Diagnosis not present

## 2016-10-08 DIAGNOSIS — E1165 Type 2 diabetes mellitus with hyperglycemia: Secondary | ICD-10-CM

## 2016-10-08 DIAGNOSIS — I252 Old myocardial infarction: Secondary | ICD-10-CM

## 2016-10-08 DIAGNOSIS — R634 Abnormal weight loss: Secondary | ICD-10-CM

## 2016-10-08 DIAGNOSIS — D6481 Anemia due to antineoplastic chemotherapy: Secondary | ICD-10-CM

## 2016-10-08 DIAGNOSIS — N183 Chronic kidney disease, stage 3 (moderate): Secondary | ICD-10-CM

## 2016-10-08 DIAGNOSIS — I129 Hypertensive chronic kidney disease with stage 1 through stage 4 chronic kidney disease, or unspecified chronic kidney disease: Secondary | ICD-10-CM

## 2016-10-08 DIAGNOSIS — Z5111 Encounter for antineoplastic chemotherapy: Secondary | ICD-10-CM | POA: Diagnosis not present

## 2016-10-08 DIAGNOSIS — I700 Atherosclerosis of aorta: Secondary | ICD-10-CM

## 2016-10-08 DIAGNOSIS — E785 Hyperlipidemia, unspecified: Secondary | ICD-10-CM

## 2016-10-08 LAB — CBC WITH DIFFERENTIAL/PLATELET
Basophils Absolute: 0.1 10*3/uL (ref 0–0.1)
Basophils Relative: 1 %
EOS ABS: 0.1 10*3/uL (ref 0–0.7)
EOS PCT: 1 %
HCT: 22.2 % — ABNORMAL LOW (ref 40.0–52.0)
Hemoglobin: 7.5 g/dL — ABNORMAL LOW (ref 13.0–18.0)
LYMPHS ABS: 2.1 10*3/uL (ref 1.0–3.6)
LYMPHS PCT: 10 %
MCH: 28.7 pg (ref 26.0–34.0)
MCHC: 33.7 g/dL (ref 32.0–36.0)
MCV: 85.2 fL (ref 80.0–100.0)
MONOS PCT: 2 %
Monocytes Absolute: 0.5 10*3/uL (ref 0.2–1.0)
Neutro Abs: 17.2 10*3/uL — ABNORMAL HIGH (ref 1.4–6.5)
Neutrophils Relative %: 86 %
Platelets: 146 10*3/uL — ABNORMAL LOW (ref 150–440)
RBC: 2.61 MIL/uL — AB (ref 4.40–5.90)
RDW: 19.5 % — ABNORMAL HIGH (ref 11.5–14.5)
WBC: 20 10*3/uL — AB (ref 3.8–10.6)

## 2016-10-08 LAB — COMPREHENSIVE METABOLIC PANEL
ALT: 12 U/L — ABNORMAL LOW (ref 17–63)
ANION GAP: 6 (ref 5–15)
AST: 21 U/L (ref 15–41)
Albumin: 3 g/dL — ABNORMAL LOW (ref 3.5–5.0)
Alkaline Phosphatase: 147 U/L — ABNORMAL HIGH (ref 38–126)
BILIRUBIN TOTAL: 0.6 mg/dL (ref 0.3–1.2)
BUN: 13 mg/dL (ref 6–20)
CHLORIDE: 111 mmol/L (ref 101–111)
CO2: 20 mmol/L — ABNORMAL LOW (ref 22–32)
Calcium: 8.2 mg/dL — ABNORMAL LOW (ref 8.9–10.3)
Creatinine, Ser: 1.29 mg/dL — ABNORMAL HIGH (ref 0.61–1.24)
GFR, EST NON AFRICAN AMERICAN: 58 mL/min — AB (ref >60)
Glucose, Bld: 134 mg/dL — ABNORMAL HIGH (ref 65–99)
POTASSIUM: 4.2 mmol/L (ref 3.5–5.1)
Sodium: 137 mmol/L (ref 135–145)
TOTAL PROTEIN: 6 g/dL — AB (ref 6.5–8.1)

## 2016-10-08 LAB — PREPARE RBC (CROSSMATCH)

## 2016-10-08 LAB — SAMPLE TO BLOOD BANK

## 2016-10-08 MED ORDER — ACETAMINOPHEN 325 MG PO TABS
650.0000 mg | ORAL_TABLET | Freq: Once | ORAL | Status: AC
  Administered 2016-10-08: 650 mg via ORAL
  Filled 2016-10-08: qty 2

## 2016-10-08 MED ORDER — MORPHINE SULFATE 15 MG PO TABS: 15.0000 mg | ORAL_TABLET | Freq: Four times a day (QID) | ORAL | 0 refills | Status: AC | PRN

## 2016-10-08 MED ORDER — DIPHENHYDRAMINE HCL 25 MG PO CAPS
25.0000 mg | ORAL_CAPSULE | Freq: Once | ORAL | Status: AC
  Administered 2016-10-08: 25 mg via ORAL
  Filled 2016-10-08: qty 1

## 2016-10-08 MED ORDER — HEPARIN SOD (PORK) LOCK FLUSH 100 UNIT/ML IV SOLN: 500.0000 [IU] | Freq: Every day | INTRAVENOUS | Status: DC | PRN

## 2016-10-08 MED ORDER — LEVETIRACETAM 500 MG PO TABS: 500.0000 mg | ORAL_TABLET | Freq: Two times a day (BID) | ORAL | 0 refills | Status: DC

## 2016-10-08 MED ORDER — SODIUM CHLORIDE 0.9 % IV SOLN
250.0000 mL | Freq: Once | INTRAVENOUS | Status: AC
  Administered 2016-10-08: 250 mL via INTRAVENOUS
  Filled 2016-10-08: qty 250

## 2016-10-08 NOTE — Progress Notes (Signed)
Patient here today for follow up.  Patient concerned about recent seizures.

## 2016-10-08 NOTE — Assessment & Plan Note (Addendum)
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 FEB 2018- Unfortunately imaging/bone scan shows progressive disease/rapidly rising PSA ~ 1000. CT scan shows no obvious visceral  metastases apart from stable lymphadenopathy.  # s/p palliative chemo with cabazitaxel  [20mg /m2]  Cycle #1. Appx. 2 week ago.  Patient tolerated chemotherapy well except for diarrhea/seizure [C discussion below]  #  Proceed with cycle #2 in approximately 1 week. No obvious clinical progression at this time.  # Seizure/ associated with loss of consciousness. Recommend neurology evaluation. Start patient on Keppra 500 mg twice a day. Also recommend MRI of the brain to rule out metastatic disease. Patient not a candidate for X-tandi given seizures.   # diarrhea-grade 1- continue Lomotil/Imodium.  # Anemia hemoglobin 7.9 status post PRBC transfusion today.  # Lupron q 54m- last on March 16th 2018;  X-geva [4/27].   # chronic pain- sec to cancer- continue fenatnyl 100 mcg [refilled]; with morphine prn.   #Keep appointments as planned/ follow-up in one week for treatment/labs. Will set up further appts at that time.

## 2016-10-08 NOTE — Progress Notes (Signed)
[place] OFFICE PROGRESS NOTE  Patient Care Team: Tracie Harrier, MD as PCP - General (Internal Medicine)   SUMMARY OF ONCOLOGIC HISTORY:  Oncology History   # 2014- PROSTATE CA [s/p Bx-Gleason score 5+4; s/p EBRT [Dr.Crystal];July 2015- Biochem Relapse [PSA-15]; non-compliance with Lupron; JAN 2016-PSA 50   # MAY 2016- PSA-100/Bone scan- Bone mets [s/p EBRT Right Hip]; CT A/P- PELVIC/RP LN;   # July 2016- ? CASTRATE RESISTANT START ZYTIGA + Prednisone; AUG PSA 3.2; OCT 2016- CT- Improved Pelvic/RP LN ~10-17MM; Multiple bone lesions;OCT 2016-Bone scan- Neg for progressive mets.OCT 2016- 1.01   # AUG 2017- PSA 43/bone scan- worse/CT- stable/improved- START Taxotere q 3W x 6 cycles. FEB 2018- CT stable pelvic LN; Bone scan- progressive lesions/ PSA rising [350--1000 [may 2018]; NOT CANDIDATE for X-Tandi sec to Seizures.   # MAY 11th 2018-CABAZITAXEL [25m/m2; neulasata]  # Bone mets- X-geva;   # April 2018-  [place] MET/CDK6-amplification MED; NEG for BRCA **     Malignant neoplasm of prostate ([facility]    INTERVAL HISTORY:  61year old male patient with Above history of metastatic prostate cancer castrate resistant currently on cabazitaxel status post cycle #1 approximately 2 week ago is here for follow-up.  Patient states that he had an episode of seizure approximately 1 week ago. States that he was sitting in his living room- which has been his wife. He states that he was in a "trans-" for unknown duration of time. He states he put his tongue. However did not lose any control of bladder or bowels. He did have involuntary movements of his hands and legs. Evaluated by EMT- however declined hospitalization. He has a previous history of seizures approximately 2 years ago.  Reports to have some diarrhea the last week or so. Currently improved. Pain is currently stable on fentanyl patch and morphine.  REVIEW OF SYSTEMS:  A complete 10 point review  of system is done which is negative except mentioned above/history of present illness.   PAST MEDICAL HISTORY :  Past Medical History:  Diagnosis Date  . Anxiety   . Cancer ([facility]    states he had cancer in his left forearm but is unaware of type and was told his body took care of it  . Chronic kidney disease    stage 3  . Depression   . Essential hypertension, benign   . Family history of adverse reaction to anesthesia    adopted  . [place]   . Obesity   . Other and unspecified hyperlipidemia   . PONV (postoperative nausea and vomiting)    nausea  . [place]   . Type II or unspecified type diabetes mellitus without mention of complication, uncontrolled    Metformin  . Varicosities     PAST SURGICAL HISTORY :   Past Surgical History:  Procedure Laterality Date  . CARDIAC CATHETERIZATION    . COLONOSCOPY  2013  . COLONOSCOPY WITH PROPOFOL N/A 01/23/2015   Procedure: COLONOSCOPY WITH PROPOFOL;  Surgeon: PHulen Luster MD;  [facility]  Service: Gastroenterology;  Laterality: N/A;  . COLONOSCOPY WITH PROPOFOL N/A 09/16/2016   Procedure: COLONOSCOPY WITH PROPOFOL;  Surgeon: KJonathon Bellows MD;  Location: [facility] ENDOSCOPY;  Service: Endoscopy;  Laterality: N/A;  . ESOPHAGOGASTRODUODENOSCOPY (EGD) WITH PROPOFOL N/A 09/16/2016   Procedure: ESOPHAGOGASTRODUODENOSCOPY (EGD) WITH PROPOFOL;  Surgeon: KJonathon Bellows MD;  Location: [facility] ENDOSCOPY;  Service: Endoscopy;  Laterality: N/A;  . INTRAOPERATIVE ARTERIOGRAM     for  vascular disease  . TONSILLECTOMY    . TOTAL HIP ARTHROPLASTY Right 05/01/2015   Procedure: TOTAL HIP ARTHROPLASTY ANTERIOR APPROACH;  Surgeon: Hessie Knows, MD;  Location: [facility] ORS;  Service: Orthopedics;  Laterality: Right;    FAMILY HISTORY :   Family History  Problem Relation Age of Onset  . Adopted: Yes    SOCIAL HISTORY:   Social History  Substance Use Topics  . Smoking status: Current Every Day Smoker    Packs/day: 0.50     Types: Cigarettes  . Smokeless tobacco: Never Used  . Alcohol use 0.0 oz/week     Comment: 28 12 ounce beers per week    ALLERGIES:  is allergic to codeine sulfate and prednisone.  MEDICATIONS:  Current Outpatient Prescriptions  Medication Sig Dispense Refill  . ALPRAZolam (XANAX) 0.5 MG tablet Take 0.5 mg by mouth 2 (two) times daily as needed for sleep.     Marland Kitchen atenolol (TENORMIN) 100 MG tablet Take 0.5 tablets (50 mg total) by mouth daily. 15 tablet 3  . diphenoxylate-atropine (LOMOTIL) 2.5-0.025 MG tablet Take 1 tablet by mouth 4 (four) times daily as needed for diarrhea or loose stools. Take it along with immodium 60 tablet 0  . feeding supplement, ENSURE ENLIVE, (ENSURE ENLIVE) LIQD Take 237 mLs by mouth 2 (two) times daily between meals. 237 mL 12  . fentaNYL (DURAGESIC - DOSED MCG/HR) 25 MCG/HR patch Place 1 patch (25 mcg total) onto the skin every 3 (three) days. 10 patch 0  . fentaNYL (DURAGESIC - DOSED MCG/HR) 75 MCG/HR Place 1 patch (75 mcg total) onto the skin every 3 (three) days. 5 patch 0  . hydrocortisone (ANUSOL-HC) 25 MG suppository Place 1 suppository (25 mg total) rectally 2 (two) times daily. 60 suppository 3  . isosorbide mononitrate (IMDUR) 30 MG 24 hr tablet Take 1 tablet (30 mg total) by mouth daily. 30 tablet 0  . metFORMIN (GLUCOPHAGE) 1000 MG tablet Take 1,000 mg by mouth daily with breakfast.     . mirtazapine (REMERON) 15 MG tablet Take 1 tablet (15 mg total) by mouth at bedtime. 30 tablet 3  . morphine (MSIR) 15 MG tablet Take 1 tablet (15 mg total) by mouth every 6 (six) hours as needed for severe pain. 90 tablet 0  . pantoprazole (PROTONIX) 40 MG tablet Take 1 tablet (40 mg total) by mouth daily. 30 tablet 3  . simvastatin (ZOCOR) 40 MG tablet Take 40 mg by mouth every evening.    . tamsulosin (FLOMAX) 0.4 MG CAPS capsule Take 1 capsule (0.4 mg total) by mouth daily after supper. 30 capsule 6  . levETIRAcetam (KEPPRA) 500 MG tablet Take 1 tablet (500 mg total)  by mouth 2 (two) times daily. 60 tablet 0   No current facility-administered medications for this visit.     PHYSICAL EXAMINATION: ECOG PERFORMANCE STATUS: 2 - Symptomatic, <50% confined to bed  BP 114/70 (BP Location: Left Arm, Patient Position: Sitting)   Pulse 60   Temp (!) 96.6 F (35.9 C) (Tympanic)   Resp 18   Wt 172 lb (78 kg)   BMI 26.15 kg/m   Filed Weights   10/08/16 1240  Weight: 172 lb (78 kg)    GENERAL: Well-nourished well-developed; Alert, no distress and comfortable.His walking by himself. Accompanied by her sister. EYES: no pallor or icterus OROPHARYNX: no thrush or ulceration; poor dentition  NECK: supple, no masses felt LYMPH:  no palpable lymphadenopathy in the cervical, axillary or inguinal regions LUNGS: clear to auscultation  and  No wheeze or crackles HEART/CVS: regular rate & rhythm and no murmurs; No lower extremity edema ABDOMEN:abdomen soft, non-tender and normal bowel sounds Musculoskeletal:no cyanosis of digits and no clubbing  PSYCH: alert & oriented x 3 with fluent speech NEURO: no focal motor/sensory deficits SKIN:  no rashes or significant lesions  LABORATORY DATA:  I have reviewed the data as listed    Component Value Date/Time   NA 137 10/08/2016 0838   K 4.2 10/08/2016 0838   CL 111 10/08/2016 0838   CO2 20 (L) 10/08/2016 0838   GLUCOSE 134 (H) 10/08/2016 0838   BUN 13 10/08/2016 0838   CREATININE 1.29 (H) 10/08/2016 0838   CALCIUM 8.2 (L) 10/08/2016 0838   PROT 6.0 (L) 10/08/2016 0838   ALBUMIN 3.0 (L) 10/08/2016 0838   AST 21 10/08/2016 0838   ALT 12 (L) 10/08/2016 0838   ALKPHOS 147 (H) 10/08/2016 0838   BILITOT 0.6 10/08/2016 0838   GFRNONAA 58 (L) 10/08/2016 0838   GFRAA >60 10/08/2016 0838    No results found for: SPEP, UPEP  Lab Results  Component Value Date   WBC 20.0 (H) 10/08/2016   NEUTROABS 17.2 (H) 10/08/2016   HGB 7.5 (L) 10/08/2016   HCT 22.2 (L) 10/08/2016   MCV 85.2 10/08/2016   PLT 146 (L)  10/08/2016      Chemistry      Component Value Date/Time   NA 137 10/08/2016 0838   K 4.2 10/08/2016 0838   CL 111 10/08/2016 0838   CO2 20 (L) 10/08/2016 0838   BUN 13 10/08/2016 0838   CREATININE 1.29 (H) 10/08/2016 0838      Component Value Date/Time   CALCIUM 8.2 (L) 10/08/2016 0838   ALKPHOS 147 (H) 10/08/2016 0838   AST 21 10/08/2016 0838   ALT 12 (L) 10/08/2016 0838   BILITOT 0.6 10/08/2016 0838     Results for FODAY, CONE (MRN [id number]) as of 06/25/2016 10:29  Ref. Range 10/24/2015 13:00 12/02/2015 14:40 03/04/2016 09:00 05/28/2016 11:20 06/18/2016 10:28  PSA Latest Ref Range: 0.00 - 4.00 ng/mL 24.20 (H) 43.69 (H) 68.03 (H) 262.00 (H) 355.00 (H)   IMPRESSION: 1. Multiple areas of relative increased attenuation identified in the right lobe of the liver on the previous study are not evident today. These were felt to be likely related to benign etiology previously and slight difference in bolus timing could account for lack of visualization today. 2. Stable appearance of the index lymph nodes identified previously in the abdomen and pelvis. No new or progressive lymphadenopathy on today's study. 3. Fairly widespread sclerotic bony metastases with apparent slight progression in the bony pelvis with relative stability of lesions identified in the thoracolumbar spine. 4. Interval stability of multiple lesions identified in the inferior kidneys bilaterally. Some of these have attenuation higher than would be expected for a simple cyst and while likely cysts complicated by proteinaceous debris or hemorrhage, attention on follow-up recommended. 5.  Abdominal Aortic Atherosclerois (ICD10-170.0)   Electronically Signed   By: Misty Stanley M.D.   On: 06/21/2016 13:59 ------------------------------------------------------------------------------ IMPRESSION: Progression of diffuse osseous metastatic disease as detailed above.   Electronically Signed   By: Genia Del  M.D.   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 FEB 2018- Unfortunately imaging/bone scan shows progressive disease/rapidly rising PSA ~ 1000. CT scan shows no obvious visceral  metastases apart from  stable lymphadenopathy.  # s/p palliative chemo with cabazitaxel  [3m/m2]  Cycle #1. Appx. 2 week ago.  Patient tolerated chemotherapy well except for diarrhea/seizure [C discussion below]  #  Proceed with cycle #2 in approximately 1 week. No obvious clinical progression at this time.  # Seizure/ associated with loss of consciousness. Recommend neurology evaluation. Start patient on Keppra 500 mg twice a day. Also recommend MRI of the brain to rule out metastatic disease. Patient not a candidate for X-tandi given seizures.   # diarrhea-grade 1- continue Lomotil/Imodium.  # Anemia hemoglobin 7.9 status post PRBC transfusion today.  # Lupron q 416mlast on March 16th 2018;  X-geva [4/27].   # chronic pain- sec to cancer- continue fenatnyl 100 mcg [refilled]; with morphine prn.   #Keep appointments as planned/ follow-up in one week for treatment/labs. Will set up further appts at that time.     GoCammie SickleMD 10/09/2016 6:45 PM

## 2016-10-09 ENCOUNTER — Telehealth: Payer: Self-pay | Admitting: Internal Medicine

## 2016-10-09 DIAGNOSIS — R569 Unspecified convulsions: Secondary | ICD-10-CM | POA: Insufficient documentation

## 2016-10-09 LAB — TYPE AND SCREEN
ABO/RH(D): O POS
ANTIBODY SCREEN: NEGATIVE
UNIT DIVISION: 0

## 2016-10-09 LAB — BPAM RBC
BLOOD PRODUCT EXPIRATION DATE: 201806162359
ISSUE DATE / TIME: 201805251053
UNIT TYPE AND RH: 5100

## 2016-10-09 NOTE — Telephone Encounter (Signed)
Please inform patient that I would like to get an MRI of the brain- re: recent seizures; and also make a referral to neurology. Re: seizures.

## 2016-10-12 ENCOUNTER — Other Ambulatory Visit: Payer: Self-pay | Admitting: *Deleted

## 2016-10-12 DIAGNOSIS — R569 Unspecified convulsions: Secondary | ICD-10-CM

## 2016-10-12 NOTE — Telephone Encounter (Signed)
Colette:  Neuro referral has been ordered, [facility] Neuro Dr [place] Nakayama.  Please send information to them and schedule appt.

## 2016-10-15 ENCOUNTER — Inpatient Hospital Stay: Payer: BLUE CROSS/BLUE SHIELD | Admitting: Internal Medicine

## 2016-10-15 ENCOUNTER — Inpatient Hospital Stay: Payer: BLUE CROSS/BLUE SHIELD

## 2016-10-15 ENCOUNTER — Telehealth: Payer: Self-pay | Admitting: *Deleted

## 2016-10-15 ENCOUNTER — Other Ambulatory Visit: Payer: Self-pay | Admitting: *Deleted

## 2016-10-15 ENCOUNTER — Ambulatory Visit: Admission: RE | Admit: 2016-10-15 | Payer: BLUE CROSS/BLUE SHIELD | Source: Ambulatory Visit

## 2016-10-15 DIAGNOSIS — D649 Anemia, unspecified: Secondary | ICD-10-CM

## 2016-10-15 NOTE — Telephone Encounter (Signed)
msg sent to scheduling team to r/s these chemotherapy apts as soon as possible

## 2016-10-15 NOTE — Telephone Encounter (Signed)
-----   Message from [place] sent at 10/15/2016  1:59 PM EDT ----- Regarding: APPT Pt called to R/S his appts today for Mon June 4-he has an appt today at 68 for an MRI-he just called to say he couldnt make the MRI either- can we R/S his appts accordingly?-thx

## 2016-10-16 ENCOUNTER — Other Ambulatory Visit: Payer: Self-pay | Admitting: Hematology and Oncology

## 2016-10-16 NOTE — Progress Notes (Deleted)
[place] OFFICE PROGRESS NOTE  Patient Care Team: Tracie Harrier, MD as PCP - General (Internal Medicine)   SUMMARY OF ONCOLOGIC HISTORY:  Oncology History   # 2014- PROSTATE CA [s/p Bx-Gleason score 5+4; s/p EBRT [Dr.Crystal];July 2015- Biochem Relapse [PSA-15]; non-compliance with Lupron; JAN 2016-PSA 50   # MAY 2016- PSA-100/Bone scan- Bone mets [s/p EBRT Right Hip]; CT A/P- PELVIC/RP LN;   # July 2016- ? CASTRATE RESISTANT START ZYTIGA + Prednisone; AUG PSA 3.2; OCT 2016- CT- Improved Pelvic/RP LN ~10-17MM; Multiple bone lesions;OCT 2016-Bone scan- Neg for progressive mets.OCT 2016- 1.01   # AUG 2017- PSA 43/bone scan- worse/CT- stable/improved- START Taxotere q 3W x 6 cycles. FEB 2018- CT stable pelvic LN; Bone scan- progressive lesions/ PSA rising [350--1000 [may 2018]; NOT CANDIDATE for X-Tandi sec to Seizures.   # MAY 11th 2018-CABAZITAXEL ['20mg'$ /m2; neulasata]  # Bone mets- X-geva;   # April 2018-  GUARDIANT TESTING- NEG AR7; MET/CDK6-amplification MED; NEG for BRCA **     Malignant neoplasm of prostate [facility])    INTERVAL HISTORY:  61 year old male patient with above history of metastatic prostate cancer castrate resistant currently on cabazitaxel status post cycle #1 approximately 2 week ago is here for follow-up.  Patient states that he had an episode of seizure approximately 1 week ago. States that he was sitting in his living room- which has been his wife. He states that he was in a "trans-" for unknown duration of time. He states he put his tongue. However did not lose any control of bladder or bowels. He did have involuntary movements of his hands and legs. Evaluated by EMT- however declined hospitalization. He has a previous history of seizures approximately 2 years ago.  Reports to have some diarrhea the last week or so. Currently improved. Pain is currently stable on fentanyl patch and morphine.  REVIEW OF SYSTEMS:  A complete 10 point review  of system is done which is negative except mentioned above/history of present illness.   PAST MEDICAL HISTORY :  Past Medical History:  Diagnosis Date  . Anxiety   . Cancer [facility])    states he had cancer in his left forearm but is unaware of type and was told his body took care of it  . Chronic kidney disease    stage 3  . Depression   . Essential hypertension, benign   . Family history of adverse reaction to anesthesia    adopted  . Myocardial infarction ([place])   . Obesity   . Other and unspecified hyperlipidemia   . PONV (postoperative nausea and vomiting)    nausea  . Prostate cancer ([place])   . Type II or unspecified type diabetes mellitus without mention of complication, uncontrolled    Metformin  . Varicosities     PAST SURGICAL HISTORY :   Past Surgical History:  Procedure Laterality Date  . CARDIAC CATHETERIZATION    . COLONOSCOPY  2013  . COLONOSCOPY WITH PROPOFOL N/A 01/23/2015   Procedure: COLONOSCOPY WITH PROPOFOL;  Surgeon: Hulen Luster, MD;  Location: [facility] ENDOSCOPY;  Service: Gastroenterology;  Laterality: N/A;  . COLONOSCOPY WITH PROPOFOL N/A 09/16/2016   Procedure: COLONOSCOPY WITH PROPOFOL;  Surgeon: Jonathon Bellows, MD;  Location: [facility] ENDOSCOPY;  Service: Endoscopy;  Laterality: N/A;  . ESOPHAGOGASTRODUODENOSCOPY (EGD) WITH PROPOFOL N/A 09/16/2016   Procedure: ESOPHAGOGASTRODUODENOSCOPY (EGD) WITH PROPOFOL;  Surgeon: Jonathon Bellows, MD;  Location: [facility] ENDOSCOPY;  Service: Endoscopy;  Laterality: N/A;  . INTRAOPERATIVE ARTERIOGRAM     for  vascular disease  . TONSILLECTOMY    . TOTAL HIP ARTHROPLASTY Right 05/01/2015   Procedure: TOTAL HIP ARTHROPLASTY ANTERIOR APPROACH;  Surgeon: Hessie Knows, MD;  Location: [facility] ORS;  Service: Orthopedics;  Laterality: Right;    FAMILY HISTORY :   Family History  Problem Relation Age of Onset  . Adopted: Yes    SOCIAL HISTORY:   Social History  Substance Use Topics  . Smoking status: Current Every Day Smoker    Packs/day: 0.50     Types: Cigarettes  . Smokeless tobacco: Never Used  . Alcohol use 0.0 oz/week     Comment: 28 12 ounce beers per week    ALLERGIES:  is allergic to codeine sulfate and prednisone.  MEDICATIONS:  Current Outpatient Prescriptions  Medication Sig Dispense Refill  . ALPRAZolam (XANAX) 0.5 MG tablet Take 0.5 mg by mouth 2 (two) times daily as needed for sleep.     Marland Kitchen atenolol (TENORMIN) 100 MG tablet Take 0.5 tablets (50 mg total) by mouth daily. 15 tablet 3  . diphenoxylate-atropine (LOMOTIL) 2.5-0.025 MG tablet Take 1 tablet by mouth 4 (four) times daily as needed for diarrhea or loose stools. Take it along with immodium 60 tablet 0  . feeding supplement, ENSURE ENLIVE, (ENSURE ENLIVE) LIQD Take 237 mLs by mouth 2 (two) times daily between meals. 237 mL 12  . fentaNYL (DURAGESIC - DOSED MCG/HR) 25 MCG/HR patch Place 1 patch (25 mcg total) onto the skin every 3 (three) days. 10 patch 0  . fentaNYL (DURAGESIC - DOSED MCG/HR) 75 MCG/HR Place 1 patch (75 mcg total) onto the skin every 3 (three) days. 5 patch 0  . hydrocortisone (ANUSOL-HC) 25 MG suppository Place 1 suppository (25 mg total) rectally 2 (two) times daily. 60 suppository 3  . isosorbide mononitrate (IMDUR) 30 MG 24 hr tablet Take 1 tablet (30 mg total) by mouth daily. 30 tablet 0  . levETIRAcetam (KEPPRA) 500 MG tablet Take 1 tablet (500 mg total) by mouth 2 (two) times daily. 60 tablet 0  . metFORMIN (GLUCOPHAGE) 1000 MG tablet Take 1,000 mg by mouth daily with breakfast.     . mirtazapine (REMERON) 15 MG tablet Take 1 tablet (15 mg total) by mouth at bedtime. 30 tablet 3  . morphine (MSIR) 15 MG tablet Take 1 tablet (15 mg total) by mouth every 6 (six) hours as needed for severe pain. 90 tablet 0  . pantoprazole (PROTONIX) 40 MG tablet Take 1 tablet (40 mg total) by mouth daily. 30 tablet 3  . simvastatin (ZOCOR) 40 MG tablet Take 40 mg by mouth every evening.    . tamsulosin (FLOMAX) 0.4 MG CAPS capsule Take 1 capsule (0.4 mg total)  by mouth daily after supper. 30 capsule 6   No current facility-administered medications for this visit.     PHYSICAL EXAMINATION: ECOG PERFORMANCE STATUS: 2 - Symptomatic, <50% confined to bed  There were no vitals taken for this visit.  There were no vitals filed for this visit.  GENERAL: Well-nourished well-developed; Alert, no distress and comfortable.His walking by himself. Accompanied by her sister. EYES: no pallor or icterus OROPHARYNX: no thrush or ulceration; poor dentition  NECK: supple, no masses felt LYMPH:  no palpable lymphadenopathy in the cervical, axillary or inguinal regions LUNGS: clear to auscultation and  No wheeze or crackles HEART/CVS: regular rate & rhythm and no murmurs; No lower extremity edema ABDOMEN:abdomen soft, non-tender and normal bowel sounds Musculoskeletal:no cyanosis of digits and no clubbing  PSYCH: alert & oriented  x 3 with fluent speech NEURO: no focal motor/sensory deficits SKIN:  no rashes or significant lesions  LABORATORY DATA:  I have reviewed the data as listed    Component Value Date/Time   NA 137 10/08/2016 0838   K 4.2 10/08/2016 0838   CL 111 10/08/2016 0838   CO2 20 (L) 10/08/2016 0838   GLUCOSE 134 (H) 10/08/2016 0838   BUN 13 10/08/2016 0838   CREATININE 1.29 (H) 10/08/2016 0838   CALCIUM 8.2 (L) 10/08/2016 0838   PROT 6.0 (L) 10/08/2016 0838   ALBUMIN 3.0 (L) 10/08/2016 0838   AST 21 10/08/2016 0838   ALT 12 (L) 10/08/2016 0838   ALKPHOS 147 (H) 10/08/2016 0838   BILITOT 0.6 10/08/2016 0838   GFRNONAA 58 (L) 10/08/2016 0838   GFRAA >60 10/08/2016 0838    No results found for: SPEP, UPEP  Lab Results  Component Value Date   WBC 20.0 (H) 10/08/2016   NEUTROABS 17.2 (H) 10/08/2016   HGB 7.5 (L) 10/08/2016   HCT 22.2 (L) 10/08/2016   MCV 85.2 10/08/2016   PLT 146 (L) 10/08/2016      Chemistry      Component Value Date/Time   NA 137 10/08/2016 0838   K 4.2 10/08/2016 0838   CL 111 10/08/2016 0838   CO2  20 (L) 10/08/2016 0838   BUN 13 10/08/2016 0838   CREATININE 1.29 (H) 10/08/2016 0838      Component Value Date/Time   CALCIUM 8.2 (L) 10/08/2016 0838   ALKPHOS 147 (H) 10/08/2016 0838   AST 21 10/08/2016 0838   ALT 12 (L) 10/08/2016 0838   BILITOT 0.6 10/08/2016 0838     Results for SHERIDAN, GETTEL (MRN [id number]) as of 06/25/2016 10:29  Ref. Range 10/24/2015 13:00 12/02/2015 14:40 03/04/2016 09:00 05/28/2016 11:20 06/18/2016 10:28  PSA Latest Ref Range: 0.00 - 4.00 ng/mL 24.20 (H) 43.69 (H) 68.03 (H) 262.00 (H) 355.00 (H)   IMPRESSION: 1. Multiple areas of relative increased attenuation identified in the right lobe of the liver on the previous study are not evident today. These were felt to be likely related to benign etiology previously and slight difference in bolus timing could account for lack of visualization today. 2. Stable appearance of the index lymph nodes identified previously in the abdomen and pelvis. No new or progressive lymphadenopathy on today's study. 3. Fairly widespread sclerotic bony metastases with apparent slight progression in the bony pelvis with relative stability of lesions identified in the thoracolumbar spine. 4. Interval stability of multiple lesions identified in the inferior kidneys bilaterally. Some of these have attenuation higher than would be expected for a simple cyst and while likely cysts complicated by proteinaceous debris or hemorrhage, attention on follow-up recommended. 5.  Abdominal Aortic Atherosclerois (ICD10-170.0)   Electronically Signed   By: Misty Stanley M.D.   On: 06/21/2016 13:59 ------------------------------------------------------------------------------ IMPRESSION: Progression of diffuse osseous metastatic disease as detailed above.   Electronically Signed   By: Genia Del M.D.   On: 06/21/2016 15:33 RADIOGRAPHIC STUDIES:   ASSESSMENT & PLAN:   Malignant neoplasm of prostate ([place]) # METASTATIC CASTRATE  RESISTANT PROSTATE CANCER- progression on Taxotere. FEB 2018- Unfortunately imaging/bone scan shows progressive disease/rapidly rising PSA ~ 1000. CT scan shows no obvious visceral  metastases apart from stable lymphadenopathy.  # s/p palliative chemo with cabazitaxel  ['20mg'$ /m2]  Cycle #1. Appx. 2 week ago.  Patient tolerated chemotherapy well except for diarrhea/seizure [C discussion below]  #  Proceed with cycle #2 in  approximately 1 week. No obvious clinical progression at this time.  # Seizure/ associated with loss of consciousness. Recommend neurology evaluation. Start patient on Keppra 500 mg twice a day. Also recommend MRI of the brain to rule out metastatic disease. Patient not a candidate for X-tandi given seizures.   # diarrhea-grade 1- continue Lomotil/Imodium.  # Anemia hemoglobin 7.9 status post PRBC transfusion today.  # Lupron q 27m last on March 16th 2018;  X-geva [4/27].   # chronic pain- sec to cancer- continue Fentanyl 100 mcg [refilled]; with morphine prn.   #Keep appointments as planned/ follow-up in one week for treatment/labs. Will set up further appts at that time.      MLequita Asal MD 10/16/2016 3:18 PM

## 2016-10-18 ENCOUNTER — Telehealth: Payer: Self-pay | Admitting: *Deleted

## 2016-10-18 ENCOUNTER — Inpatient Hospital Stay: Payer: BLUE CROSS/BLUE SHIELD

## 2016-10-18 ENCOUNTER — Inpatient Hospital Stay: Payer: BLUE CROSS/BLUE SHIELD | Admitting: Hematology and Oncology

## 2016-10-18 NOTE — Telephone Encounter (Signed)
msg sent to scheduling team to r/s this chemotherapy apt.

## 2016-10-18 NOTE — Telephone Encounter (Signed)
-----   Message from [place] sent at 10/18/2016  9:44 AM EDT ----- Regarding: no show  Pt was no show today for his treatment.Thanks!

## 2016-10-18 NOTE — Telephone Encounter (Signed)
Left msg for patient to return RN's phone call. He missed chemotherapy apts today- RN concerned and would like to reach out to patient in f/u

## 2016-10-25 ENCOUNTER — Other Ambulatory Visit: Payer: Self-pay | Admitting: Radiation Oncology

## 2016-10-25 ENCOUNTER — Other Ambulatory Visit: Payer: Self-pay | Admitting: Internal Medicine

## 2016-10-25 DIAGNOSIS — C61 Malignant neoplasm of prostate: Secondary | ICD-10-CM

## 2016-10-26 ENCOUNTER — Other Ambulatory Visit: Payer: Self-pay | Admitting: Hematology and Oncology

## 2016-10-26 ENCOUNTER — Inpatient Hospital Stay: Payer: BLUE CROSS/BLUE SHIELD | Attending: Hematology and Oncology

## 2016-10-26 ENCOUNTER — Inpatient Hospital Stay (HOSPITAL_BASED_OUTPATIENT_CLINIC_OR_DEPARTMENT_OTHER): Payer: BLUE CROSS/BLUE SHIELD | Admitting: Hematology and Oncology

## 2016-10-26 ENCOUNTER — Inpatient Hospital Stay: Payer: BLUE CROSS/BLUE SHIELD

## 2016-10-26 ENCOUNTER — Encounter: Payer: Self-pay | Admitting: Hematology and Oncology

## 2016-10-26 ENCOUNTER — Other Ambulatory Visit: Payer: Self-pay | Admitting: *Deleted

## 2016-10-26 VITALS — BP 98/61 | HR 73 | Temp 98.1°F | Resp 18 | Wt 167.6 lb

## 2016-10-26 DIAGNOSIS — F419 Anxiety disorder, unspecified: Secondary | ICD-10-CM

## 2016-10-26 DIAGNOSIS — D649 Anemia, unspecified: Secondary | ICD-10-CM

## 2016-10-26 DIAGNOSIS — N189 Chronic kidney disease, unspecified: Secondary | ICD-10-CM | POA: Insufficient documentation

## 2016-10-26 DIAGNOSIS — R569 Unspecified convulsions: Secondary | ICD-10-CM | POA: Insufficient documentation

## 2016-10-26 DIAGNOSIS — G8929 Other chronic pain: Secondary | ICD-10-CM | POA: Insufficient documentation

## 2016-10-26 DIAGNOSIS — I252 Old myocardial infarction: Secondary | ICD-10-CM | POA: Insufficient documentation

## 2016-10-26 DIAGNOSIS — E785 Hyperlipidemia, unspecified: Secondary | ICD-10-CM | POA: Insufficient documentation

## 2016-10-26 DIAGNOSIS — C61 Malignant neoplasm of prostate: Secondary | ICD-10-CM | POA: Diagnosis present

## 2016-10-26 DIAGNOSIS — G893 Neoplasm related pain (acute) (chronic): Secondary | ICD-10-CM | POA: Insufficient documentation

## 2016-10-26 DIAGNOSIS — Z9119 Patient's noncompliance with other medical treatment and regimen: Secondary | ICD-10-CM | POA: Diagnosis not present

## 2016-10-26 DIAGNOSIS — F17210 Nicotine dependence, cigarettes, uncomplicated: Secondary | ICD-10-CM

## 2016-10-26 DIAGNOSIS — Z7984 Long term (current) use of oral hypoglycemic drugs: Secondary | ICD-10-CM | POA: Insufficient documentation

## 2016-10-26 DIAGNOSIS — F329 Major depressive disorder, single episode, unspecified: Secondary | ICD-10-CM

## 2016-10-26 DIAGNOSIS — R197 Diarrhea, unspecified: Secondary | ICD-10-CM | POA: Insufficient documentation

## 2016-10-26 DIAGNOSIS — E1122 Type 2 diabetes mellitus with diabetic chronic kidney disease: Secondary | ICD-10-CM

## 2016-10-26 DIAGNOSIS — C7951 Secondary malignant neoplasm of bone: Secondary | ICD-10-CM | POA: Insufficient documentation

## 2016-10-26 DIAGNOSIS — I129 Hypertensive chronic kidney disease with stage 1 through stage 4 chronic kidney disease, or unspecified chronic kidney disease: Secondary | ICD-10-CM | POA: Insufficient documentation

## 2016-10-26 DIAGNOSIS — Z7689 Persons encountering health services in other specified circumstances: Secondary | ICD-10-CM | POA: Diagnosis not present

## 2016-10-26 DIAGNOSIS — Z5111 Encounter for antineoplastic chemotherapy: Secondary | ICD-10-CM | POA: Insufficient documentation

## 2016-10-26 LAB — IRON AND TIBC
Iron: 96 ug/dL (ref 45–182)
Saturation Ratios: 68 % — ABNORMAL HIGH (ref 17.9–39.5)
TIBC: 141 ug/dL — ABNORMAL LOW (ref 250–450)
UIBC: 45 ug/dL

## 2016-10-26 LAB — CBC WITH DIFFERENTIAL/PLATELET
BASOS ABS: 0.1 10*3/uL (ref 0.0–0.1)
Basophils Relative: 1 %
Eosinophils Absolute: 0.3 10*3/uL (ref 0.0–0.7)
Eosinophils Relative: 4 %
HEMATOCRIT: 18.2 % — AB (ref 40.0–52.0)
HEMOGLOBIN: 6.2 g/dL — AB (ref 13.0–18.0)
LYMPHS PCT: 14 %
Lymphs Abs: 0.8 10*3/uL (ref 0.7–4.0)
MCH: 29.6 pg (ref 26.0–34.0)
MCHC: 34.3 g/dL (ref 32.0–36.0)
MCV: 86.4 fL (ref 80.0–100.0)
MONO ABS: 0.5 10*3/uL (ref 0.1–1.0)
Monocytes Relative: 7 %
NEUTROS PCT: 74 %
Neutro Abs: 4.7 10*3/uL (ref 1.7–7.7)
Platelets: 207 10*3/uL (ref 150–440)
RBC: 2.11 MIL/uL — ABNORMAL LOW (ref 4.40–5.90)
RDW: 19 % — ABNORMAL HIGH (ref 11.5–14.5)
SMEAR REVIEW: ADEQUATE
WBC: 6.3 10*3/uL (ref 4.0–10.5)

## 2016-10-26 LAB — COMPREHENSIVE METABOLIC PANEL
ALK PHOS: 141 U/L — AB (ref 38–126)
ALT: 19 U/L (ref 17–63)
AST: 20 U/L (ref 15–41)
Albumin: 2.5 g/dL — ABNORMAL LOW (ref 3.5–5.0)
Anion gap: 11 (ref 5–15)
BUN: 21 mg/dL — AB (ref 6–20)
CALCIUM: 7.4 mg/dL — AB (ref 8.9–10.3)
CO2: 17 mmol/L — AB (ref 22–32)
CREATININE: 1.51 mg/dL — AB (ref 0.61–1.24)
Chloride: 107 mmol/L (ref 101–111)
GFR calc Af Amer: 56 mL/min — ABNORMAL LOW (ref >60)
GFR calc non Af Amer: 48 mL/min — ABNORMAL LOW (ref >60)
GLUCOSE: 120 mg/dL — AB (ref 65–99)
Potassium: 4.4 mmol/L (ref 3.5–5.1)
SODIUM: 135 mmol/L (ref 135–145)
Total Bilirubin: 0.7 mg/dL (ref 0.3–1.2)
Total Protein: 6 g/dL — ABNORMAL LOW (ref 6.5–8.1)

## 2016-10-26 LAB — PSA: PROSTATIC SPECIFIC ANTIGEN: 894 ng/mL — AB (ref 0.00–4.00)

## 2016-10-26 LAB — SAMPLE TO BLOOD BANK

## 2016-10-26 LAB — PREPARE RBC (CROSSMATCH)

## 2016-10-26 LAB — RETICULOCYTES
RBC.: 2.17 MIL/uL — ABNORMAL LOW (ref 4.40–5.90)
Retic Count, Absolute: 30.4 10*3/uL (ref 19.0–183.0)
Retic Ct Pct: 1.4 % (ref 0.4–3.1)

## 2016-10-26 LAB — FOLATE: Folate: 8.3 ng/mL (ref >5.9)

## 2016-10-26 LAB — FERRITIN: Ferritin: 2459 ng/mL — ABNORMAL HIGH (ref 24–336)

## 2016-10-26 LAB — VITAMIN B12: Vitamin B-12: 1587 pg/mL — ABNORMAL HIGH (ref 180–914)

## 2016-10-26 MED ORDER — ACETAMINOPHEN 325 MG PO TABS
650.0000 mg | ORAL_TABLET | Freq: Once | ORAL | Status: AC
  Administered 2016-10-26: 650 mg via ORAL
  Filled 2016-10-26: qty 2

## 2016-10-26 MED ORDER — DIPHENHYDRAMINE HCL 25 MG PO CAPS
25.0000 mg | ORAL_CAPSULE | Freq: Once | ORAL | Status: AC
  Administered 2016-10-26: 25 mg via ORAL
  Filled 2016-10-26: qty 1

## 2016-10-26 MED ORDER — SODIUM CHLORIDE 0.9 % IV SOLN
250.0000 mL | Freq: Once | INTRAVENOUS | Status: AC
  Administered 2016-10-26: 250 mL via INTRAVENOUS
  Filled 2016-10-26: qty 250

## 2016-10-26 NOTE — Progress Notes (Signed)
[place] OFFICE PROGRESS NOTE  Patient Care Team: Tracie Harrier, MD as PCP - General (Internal Medicine)   SUMMARY OF ONCOLOGIC HISTORY:  Oncology History   # 2014- PROSTATE CA [s/p Bx-Gleason score 5+4; s/p EBRT [Dr.Crystal];July 2015- Biochem Relapse [PSA-15]; non-compliance with Lupron; JAN 2016-PSA 50   # MAY 2016- PSA-100/Bone scan- Bone mets [s/p EBRT Right Hip]; CT A/P- PELVIC/RP LN;   # July 2016- ? CASTRATE RESISTANT START ZYTIGA + Prednisone; AUG PSA 3.2; OCT 2016- CT- Improved Pelvic/RP LN ~10-17MM; Multiple bone lesions;OCT 2016-Bone scan- Neg for progressive mets.OCT 2016- 1.01   # AUG 2017- PSA 43/bone scan- worse/CT- stable/improved- START Taxotere q 3W x 6 cycles. FEB 2018- CT stable pelvic LN; Bone scan- progressive lesions/ PSA rising [350--1000 [may 2018]; NOT CANDIDATE for X-Tandi sec to Seizures.   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  He has a history of seizures.  A head MRI had been ordered, but not performed.  He notes extreme fatigue.  He denies any numbness or tingling.  He denies any nausea or vomiting. Weight is down.  He is drinking Ensure 2x/day.  He notes bone pain and difficulty sleeping.  Pain is managed with a Fentanyl patch and morphine.  REVIEW OF SYSTEMS:  A complete 10 point review of system is done which is negative except mentioned above/history of present illness.   PAST MEDICAL HISTORY :  Past Medical History:  Diagnosis Date  . Anxiety   . Cancer [facility])    states he had cancer in his left forearm but is unaware of type and was told his body took care of it  . Chronic kidney  disease    stage 3  . Depression   . Essential hypertension, benign   . Family history of adverse reaction to anesthesia    adopted  . Myocardial infarction ([place])   . Obesity   . Other and unspecified hyperlipidemia   . PONV (postoperative nausea and vomiting)    nausea  . Prostate cancer ([place])   . Type II or unspecified type diabetes mellitus without mention of complication, uncontrolled    Metformin  . Varicosities     PAST SURGICAL HISTORY :   Past Surgical History:  Procedure Laterality Date  . CARDIAC CATHETERIZATION    . COLONOSCOPY  2013  . COLONOSCOPY WITH PROPOFOL N/A 01/23/2015   Procedure: COLONOSCOPY WITH PROPOFOL;  Surgeon: Hulen Luster, MD;  Location: [facility] ENDOSCOPY;  Service: Gastroenterology;  Laterality: N/A;  . COLONOSCOPY WITH PROPOFOL N/A 09/16/2016   Procedure: COLONOSCOPY WITH PROPOFOL;  Surgeon: Jonathon Bellows, MD;  Location: [facility] ENDOSCOPY;  Service: Endoscopy;  Laterality: N/A;  . ESOPHAGOGASTRODUODENOSCOPY (EGD) WITH PROPOFOL N/A 09/16/2016   Procedure: ESOPHAGOGASTRODUODENOSCOPY (EGD) WITH PROPOFOL;  Surgeon: Jonathon Bellows, MD;  Location: [facility] ENDOSCOPY;  Service: Endoscopy;  Laterality: N/A;  . INTRAOPERATIVE ARTERIOGRAM     for vascular disease  . TONSILLECTOMY    . TOTAL HIP ARTHROPLASTY Right 05/01/2015   Procedure: TOTAL HIP ARTHROPLASTY ANTERIOR APPROACH;  Surgeon: Hessie Knows, MD;  Location: [facility] ORS;  Service: Orthopedics;  Laterality: Right;    FAMILY HISTORY :   Family History  Problem  Relation Age of Onset  . Adopted: Yes    SOCIAL HISTORY:   Social History  Substance Use Topics  . Smoking status: Current Every Day Smoker    Packs/day: 0.50    Types: Cigarettes  . Smokeless tobacco: Never Used  . Alcohol use 0.0 oz/week     Comment: 28 12 ounce beers per week    ALLERGIES:  is allergic to codeine sulfate and prednisone.  MEDICATIONS:  Current Outpatient Prescriptions  Medication Sig Dispense Refill  . ALPRAZolam (XANAX) 0.5 MG tablet  Take 0.5 mg by mouth 2 (two) times daily as needed for sleep.     Marland Kitchen atenolol (TENORMIN) 100 MG tablet Take 0.5 tablets (50 mg total) by mouth daily. 15 tablet 3  . diphenoxylate-atropine (LOMOTIL) 2.5-0.025 MG tablet Take 1 tablet by mouth 4 (four) times daily as needed for diarrhea or loose stools. Take it along with immodium 60 tablet 0  . feeding supplement, ENSURE ENLIVE, (ENSURE ENLIVE) LIQD Take 237 mLs by mouth 2 (two) times daily between meals. 237 mL 12  . fentaNYL (DURAGESIC - DOSED MCG/HR) 25 MCG/HR patch Place 1 patch (25 mcg total) onto the skin every 3 (three) days. 10 patch 0  . fentaNYL (DURAGESIC - DOSED MCG/HR) 75 MCG/HR Place 1 patch (75 mcg total) onto the skin every 3 (three) days. 5 patch 0  . isosorbide mononitrate (IMDUR) 30 MG 24 hr tablet Take 1 tablet (30 mg total) by mouth daily. 30 tablet 0  . levETIRAcetam (KEPPRA) 500 MG tablet Take 1 tablet (500 mg total) by mouth 2 (two) times daily. 60 tablet 0  . metFORMIN (GLUCOPHAGE) 1000 MG tablet Take 1,000 mg by mouth daily with breakfast.     . mirtazapine (REMERON) 15 MG tablet Take 1 tablet (15 mg total) by mouth at bedtime. 30 tablet 3  . morphine (MSIR) 15 MG tablet Take 1 tablet (15 mg total) by mouth every 6 (six) hours as needed for severe pain. 90 tablet 0  . simvastatin (ZOCOR) 40 MG tablet Take 40 mg by mouth every evening.    . tamsulosin (FLOMAX) 0.4 MG CAPS capsule Take 1 capsule (0.4 mg total) by mouth daily after supper. 30 capsule 6  . hydrocortisone (ANUSOL-HC) 25 MG suppository Place 1 suppository (25 mg total) rectally 2 (two) times daily. (Patient not taking: Reported on 10/26/2016) 60 suppository 3  . pantoprazole (PROTONIX) 40 MG tablet Take 1 tablet (40 mg total) by mouth daily. 30 tablet 3   No current facility-administered medications for this visit.     PHYSICAL EXAMINATION: ECOG PERFORMANCE STATUS: 2 - Symptomatic, <50% confined to bed  BP 98/61 (BP Location: Left Arm, Patient Position: Sitting)  Comment: Standing 88/45  Pulse 73 Comment: 71  Temp 98.1 F (36.7 C) (Tympanic)   Resp 18   Wt 167 lb 9 oz (76 kg)   BMI 25.48 kg/m   Filed Weights   10/26/16 1038  Weight: 167 lb 9 oz (76 kg)    GENERAL:  Fatigued appearing gentleman sitting comfortably ina wheelchair in the exam room in no acute distress.  He is accompanied by his brother. MENTAL STATUS:  Alert and oriented to person, place and time. HEAD:  Pearline Cables hair.  Scruffy beard.  Normocephalic, atraumatic, face symmetric, no Cushingoid features. EYES:  Hazel eyes.  Pupils equal round and reactive to light and accomodation.  No conjunctivitis or scleral icterus. ENT:  Oropharynx clear without lesion.  Tongue normal. Mucous membranes moist.  RESPIRATORY:  Clear  to auscultation without rales, wheezes or rhonchi. CARDIOVASCULAR:  Regular rate and rhythm without murmur, rub or gallop. ABDOMEN:  Soft, non-tender, with active bowel sounds, and no hepatosplenomegaly.  No masses. SKIN:  Pale.  No rashes, ulcers or lesions. EXTREMITIES: No edema, no skin discoloration or tenderness.  No palpable cords. LYMPH NODES: No palpable cervical, supraclavicular, axillary or inguinal adenopathy  NEUROLOGICAL: Unremarkable. PSYCH:  Appropriate.   LABORATORY DATA:  I have reviewed the data as listed    Component Value Date/Time   NA 135 10/26/2016 0919   K 4.4 10/26/2016 0919   CL 107 10/26/2016 0919   CO2 17 (L) 10/26/2016 0919   GLUCOSE 120 (H) 10/26/2016 0919   BUN 21 (H) 10/26/2016 0919   CREATININE 1.51 (H) 10/26/2016 0919   CALCIUM 7.4 (L) 10/26/2016 0919   PROT 6.0 (L) 10/26/2016 0919   ALBUMIN 2.5 (L) 10/26/2016 0919   AST 20 10/26/2016 0919   ALT 19 10/26/2016 0919   ALKPHOS 141 (H) 10/26/2016 0919   BILITOT 0.7 10/26/2016 0919   GFRNONAA 48 (L) 10/26/2016 0919   GFRAA 56 (L) 10/26/2016 0919    No results found for: SPEP, UPEP  Lab Results  Component Value Date   WBC 6.3 10/26/2016   NEUTROABS 4.7 10/26/2016   HGB  6.2 (L) 10/26/2016   HCT 18.2 (L) 10/26/2016   MCV 86.4 10/26/2016   PLT 207 10/26/2016      Chemistry      Component Value Date/Time   NA 135 10/26/2016 0919   K 4.4 10/26/2016 0919   CL 107 10/26/2016 0919   CO2 17 (L) 10/26/2016 0919   BUN 21 (H) 10/26/2016 0919   CREATININE 1.51 (H) 10/26/2016 0919      Component Value Date/Time   CALCIUM 7.4 (L) 10/26/2016 0919   ALKPHOS 141 (H) 10/26/2016 0919   AST 20 10/26/2016 0919   ALT 19 10/26/2016 0919   BILITOT 0.7 10/26/2016 0919     Results for MARTE, CELANI (MRN [id number]) as of 06/25/2016 10:29  Ref. Range 10/24/2015 13:00 12/02/2015 14:40 03/04/2016 09:00 05/28/2016 11:20 06/18/2016 10:28  PSA Latest Ref Range: 0.00 - 4.00 ng/mL 24.20 (H) 43.69 (H) 68.03 (H) 262.00 (H) 355.00 (H)   IMPRESSION: 1. Multiple areas of relative increased attenuation identified in the right lobe of the liver on the previous study are not evident today. These were felt to be likely related to benign etiology previously and slight difference in bolus timing could account for lack of visualization today. 2. Stable appearance of the index lymph nodes identified previously in the abdomen and pelvis. No new or progressive lymphadenopathy on today's study. 3. Fairly widespread sclerotic bony metastases with apparent slight progression in the bony pelvis with relative stability of lesions identified in the thoracolumbar spine. 4. Interval stability of multiple lesions identified in the inferior kidneys bilaterally. Some of these have attenuation higher than would be expected for a simple cyst and while likely cysts complicated by proteinaceous debris or hemorrhage, attention on follow-up recommended. 5.  Abdominal Aortic Atherosclerois (ICD10-170.0)   Electronically Signed   By: Misty Stanley M.D.   On: 06/21/2016 13:59 ------------------------------------------------------------------------------ IMPRESSION: Progression of diffuse osseous  metastatic disease as detailed above.   Electronically Signed   By: Genia Del M.D.   On: 06/21/2016 15:33 RADIOGRAPHIC STUDIES:   ASSESSMENT & PLAN:   Malignant neoplasm of prostate ([place]) # METASTATIC CASTRATE RESISTANT PROSTATE CANCER- progression on Taxotere. FEB 2018- Unfortunately imaging/bone scan shows progressive disease/rapidly  rising PSA ~ 1000. CT scan shows no obvious visceral  metastases apart from stable lymphadenopathy.  # s/p palliative chemo with cabazitaxel  ['20mg'$ /m2]  Cycle #1 on 09/24/2016.  Patient tolerated chemotherapy well except for diarrhea/seizure.  #  Postpone cycle #2 as patient severely anemic.  RTC on 10/28/2016 for reassessment, labs (CBC with diff, CMP) and cycle #2 cabazitaxel.  # Seizure/ associated with loss of consciousness. Recommend neurology evaluation. Start patient on Keppra 500 mg twice a day. MRI of the brain to rule out metastatic disease was previously ordered. Reschedule head MRI.  Patient not a candidate for X-tandi given seizures.   # diarrhea-grade 1- continue Lomotil/Imodium.  # Anemia hemoglobin 6.2.  Transfuse 2 units of PRBCs today.  Additional labs ordered (ferritin, iron studies, B12, folate, retic).  # Lupron q 41m last on March 16th 2018;  X-geva [4/27].   # chronic pain- secondary to cancer- continue Fentanyl 100 mcg; with morphine prn.   # RTC later this week for MD assessment, labs, and consideration of cycle #2 cabazitaxel.    MLequita Asal MD 10/26/2016 10:56 AM

## 2016-10-26 NOTE — Progress Notes (Signed)
Patient of Dr. Jacinto Reap.  Patient offers no complaints today.

## 2016-10-27 ENCOUNTER — Ambulatory Visit: Payer: BLUE CROSS/BLUE SHIELD

## 2016-10-27 ENCOUNTER — Telehealth: Payer: Self-pay | Admitting: *Deleted

## 2016-10-27 LAB — BPAM RBC
Blood Product Expiration Date: 201806302359
Blood Product Expiration Date: 201806302359
ISSUE DATE / TIME: 201806121241
ISSUE DATE / TIME: 201806121419
Unit Type and Rh: 5100
Unit Type and Rh: 5100

## 2016-10-27 LAB — TYPE AND SCREEN
ABO/RH(D): O POS
Antibody Screen: NEGATIVE
Unit division: 0
Unit division: 0

## 2016-10-27 NOTE — Telephone Encounter (Signed)
Call attempt to reach patient. Left vm for pt to return my phone call to discuss his lab results.  psa results routed to Dr. Kem Parkinson team - Patient coming back tomorrow to clinic to see MD. He was very anxious to get his PSA results. The results have slightly improved since last drawn in April. I am not able to reach the patient. I asked Dr. Kem Parkinson team to provide him with these results tomorrow as I will not be here. Nira Conn, RN

## 2016-10-28 ENCOUNTER — Inpatient Hospital Stay: Payer: BLUE CROSS/BLUE SHIELD

## 2016-10-28 ENCOUNTER — Inpatient Hospital Stay (HOSPITAL_BASED_OUTPATIENT_CLINIC_OR_DEPARTMENT_OTHER): Payer: BLUE CROSS/BLUE SHIELD | Admitting: Hematology and Oncology

## 2016-10-28 VITALS — BP 116/71 | HR 66 | Temp 97.8°F | Resp 18 | Wt 169.5 lb

## 2016-10-28 DIAGNOSIS — F17210 Nicotine dependence, cigarettes, uncomplicated: Secondary | ICD-10-CM | POA: Diagnosis not present

## 2016-10-28 DIAGNOSIS — D649 Anemia, unspecified: Secondary | ICD-10-CM

## 2016-10-28 DIAGNOSIS — F419 Anxiety disorder, unspecified: Secondary | ICD-10-CM

## 2016-10-28 DIAGNOSIS — R197 Diarrhea, unspecified: Secondary | ICD-10-CM | POA: Diagnosis not present

## 2016-10-28 DIAGNOSIS — N189 Chronic kidney disease, unspecified: Secondary | ICD-10-CM

## 2016-10-28 DIAGNOSIS — C61 Malignant neoplasm of prostate: Secondary | ICD-10-CM

## 2016-10-28 DIAGNOSIS — F329 Major depressive disorder, single episode, unspecified: Secondary | ICD-10-CM | POA: Diagnosis not present

## 2016-10-28 DIAGNOSIS — G8929 Other chronic pain: Secondary | ICD-10-CM

## 2016-10-28 DIAGNOSIS — E785 Hyperlipidemia, unspecified: Secondary | ICD-10-CM | POA: Diagnosis not present

## 2016-10-28 DIAGNOSIS — C7951 Secondary malignant neoplasm of bone: Secondary | ICD-10-CM | POA: Diagnosis not present

## 2016-10-28 DIAGNOSIS — G893 Neoplasm related pain (acute) (chronic): Secondary | ICD-10-CM

## 2016-10-28 DIAGNOSIS — R569 Unspecified convulsions: Secondary | ICD-10-CM

## 2016-10-28 DIAGNOSIS — Z7689 Persons encountering health services in other specified circumstances: Secondary | ICD-10-CM

## 2016-10-28 DIAGNOSIS — I252 Old myocardial infarction: Secondary | ICD-10-CM

## 2016-10-28 DIAGNOSIS — E1122 Type 2 diabetes mellitus with diabetic chronic kidney disease: Secondary | ICD-10-CM | POA: Diagnosis not present

## 2016-10-28 DIAGNOSIS — Z9119 Patient's noncompliance with other medical treatment and regimen: Secondary | ICD-10-CM

## 2016-10-28 DIAGNOSIS — I129 Hypertensive chronic kidney disease with stage 1 through stage 4 chronic kidney disease, or unspecified chronic kidney disease: Secondary | ICD-10-CM

## 2016-10-28 DIAGNOSIS — Z7984 Long term (current) use of oral hypoglycemic drugs: Secondary | ICD-10-CM

## 2016-10-28 DIAGNOSIS — Z5111 Encounter for antineoplastic chemotherapy: Secondary | ICD-10-CM | POA: Insufficient documentation

## 2016-10-28 LAB — COMPREHENSIVE METABOLIC PANEL
ALT: 15 U/L — ABNORMAL LOW (ref 17–63)
AST: 25 U/L (ref 15–41)
Albumin: 2.6 g/dL — ABNORMAL LOW (ref 3.5–5.0)
Alkaline Phosphatase: 166 U/L — ABNORMAL HIGH (ref 38–126)
Anion gap: 11 (ref 5–15)
BUN: 19 mg/dL (ref 6–20)
CO2: 18 mmol/L — ABNORMAL LOW (ref 22–32)
Calcium: 7.5 mg/dL — ABNORMAL LOW (ref 8.9–10.3)
Chloride: 104 mmol/L (ref 101–111)
Creatinine, Ser: 1.34 mg/dL — ABNORMAL HIGH (ref 0.61–1.24)
GFR calc Af Amer: >60 mL/min (ref >60)
GFR calc non Af Amer: 56 mL/min — ABNORMAL LOW (ref >60)
Glucose, Bld: 140 mg/dL — ABNORMAL HIGH (ref 65–99)
Potassium: 4.5 mmol/L (ref 3.5–5.1)
Sodium: 133 mmol/L — ABNORMAL LOW (ref 135–145)
Total Bilirubin: 0.5 mg/dL (ref 0.3–1.2)
Total Protein: 6 g/dL — ABNORMAL LOW (ref 6.5–8.1)

## 2016-10-28 LAB — CBC WITH DIFFERENTIAL/PLATELET
Basophils Absolute: 0.1 10*3/uL (ref 0–0.1)
Basophils Relative: 1 %
Eosinophils Absolute: 0.2 10*3/uL (ref 0–0.7)
Eosinophils Relative: 3 %
HCT: 24.6 % — ABNORMAL LOW (ref 40.0–52.0)
Hemoglobin: 8.4 g/dL — ABNORMAL LOW (ref 13.0–18.0)
Lymphocytes Relative: 13 %
Lymphs Abs: 1 10*3/uL (ref 1.0–3.6)
MCH: 29 pg (ref 26.0–34.0)
MCHC: 34.3 g/dL (ref 32.0–36.0)
MCV: 84.5 fL (ref 80.0–100.0)
Monocytes Absolute: 0.6 10*3/uL (ref 0.2–1.0)
Monocytes Relative: 8 %
Neutro Abs: 5.4 10*3/uL (ref 1.4–6.5)
Neutrophils Relative %: 75 %
Platelets: 177 10*3/uL (ref 150–440)
RBC: 2.91 MIL/uL — ABNORMAL LOW (ref 4.40–5.90)
RDW: 19 % — ABNORMAL HIGH (ref 11.5–14.5)
WBC: 7.2 10*3/uL (ref 3.8–10.6)

## 2016-10-28 MED ORDER — DIPHENHYDRAMINE HCL 50 MG/ML IJ SOLN
25.0000 mg | Freq: Once | INTRAMUSCULAR | Status: AC
  Administered 2016-10-28: 25 mg via INTRAVENOUS
  Filled 2016-10-28: qty 1

## 2016-10-28 MED ORDER — PEGFILGRASTIM 6 MG/0.6ML ~~LOC~~ PSKT
6.0000 mg | PREFILLED_SYRINGE | Freq: Once | SUBCUTANEOUS | Status: AC
  Administered 2016-10-28: 6 mg via SUBCUTANEOUS
  Filled 2016-10-28: qty 0.6

## 2016-10-28 MED ORDER — FAMOTIDINE IN NACL 20-0.9 MG/50ML-% IV SOLN
20.0000 mg | Freq: Once | INTRAVENOUS | Status: AC
  Administered 2016-10-28: 20 mg via INTRAVENOUS
  Filled 2016-10-28: qty 50

## 2016-10-28 MED ORDER — SODIUM CHLORIDE 0.9 % IV SOLN
20.0000 mg/m2 | Freq: Once | INTRAVENOUS | Status: AC
  Administered 2016-10-28: 42 mg via INTRAVENOUS
  Filled 2016-10-28: qty 4.2

## 2016-10-28 MED ORDER — SODIUM CHLORIDE 0.9 % IV SOLN
Freq: Once | INTRAVENOUS | Status: AC
  Administered 2016-10-28: 12:00:00 via INTRAVENOUS
  Filled 2016-10-28: qty 1000

## 2016-10-28 MED ORDER — DEXAMETHASONE SODIUM PHOSPHATE 10 MG/ML IJ SOLN
10.0000 mg | Freq: Once | INTRAMUSCULAR | Status: AC
  Administered 2016-10-28: 10 mg via INTRAVENOUS
  Filled 2016-10-28: qty 1

## 2016-10-28 NOTE — Progress Notes (Signed)
Patient states he is feeling much better.  Patient wanted to know his PSA results.  Renita Papa, RN had tried to call patient yesterday with results without success.  Gave patient results.  Patient offers no complaints today.

## 2016-10-28 NOTE — Progress Notes (Signed)
[facility] OFFICE PROGRESS NOTE  Patient Care Team: Hande, Vishwanath, MD as PCP - General (Internal Medicine)   SUMMARY OF ONCOLOGIC HISTORY:  Oncology History   # 2014- PROSTATE CA [s/p Bx-Gleason score 5+4; s/p EBRT [Dr.Crystal];July 2015- Biochem Relapse [PSA-15]; non-compliance with Lupron; JAN 2016-PSA 50   # MAY 2016- PSA-100/Bone scan- Bone mets [s/p EBRT Right Hip]; CT A/P- PELVIC/RP LN;   # July 2016- ? CASTRATE RESISTANT START ZYTIGA + Prednisone; AUG PSA 3.2; OCT 2016- CT- Improved Pelvic/RP LN ~10-17MM; Multiple bone lesions;OCT 2016-Bone scan- Neg for progressive mets.OCT 2016- 1.01   # AUG 2017- PSA 43/bone scan- worse/CT- stable/improved- START Taxotere q 3W x 6 cycles. FEB 2018- CT stable pelvic LN; Bone scan- progressive lesions/ PSA rising [350--1000 [may 2018]; NOT CANDIDATE for X-Tandi sec to Seizures.   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  He is here for follow-up and consideration of cycle #2.  Patient was last seen in the medical oncology clinic on 10/26/2016.  At that time, he was extremely fatigued.  Hematocrit was 16.2 and hemoglobin was 6.2.  He received 2 units of PRBCs.  He underwent an anemia work-up:  B12 was 1587. Folate was 8.3.  Ferritin was 2459.  Iron saturation was 68%.  TIBC was 141. Reticulocyte count was 1.4%.  Symptomatically, he is feeling "100% better".  He is forcing himself to eat.  He denies any nausea or vomiting.  He would like to receive chemotherapy today.   REVIEW OF SYSTEMS:  A complete 10 point review of system is done which is negative except mentioned above/history of present illness.    PAST MEDICAL HISTORY :  Past Medical History:  Diagnosis Date  . Anxiety   . Cancer ([facility])    states he had cancer in his left forearm but is unaware of type and was told his body took care of it  . Chronic kidney disease    stage 3  . Depression   . Essential hypertension, benign   . Family history of adverse reaction to anesthesia    adopted  . Myocardial infarction ([facility])   . Obesity   . Other and unspecified hyperlipidemia   . PONV (postoperative nausea and vomiting)    nausea  . Prostate cancer ([facility])   . Type II or unspecified type diabetes mellitus without mention of complication, uncontrolled    Metformin  . Varicosities     PAST SURGICAL HISTORY :   Past Surgical History:  Procedure Laterality Date  . CARDIAC CATHETERIZATION    . COLONOSCOPY  2013  . COLONOSCOPY WITH PROPOFOL N/A 01/23/2015   Procedure: COLONOSCOPY WITH PROPOFOL;  Surgeon: Paul Y Oh, MD;  Location: [facility] ENDOSCOPY;  Service: Gastroenterology;  Laterality: N/A;  . COLONOSCOPY WITH PROPOFOL N/A 09/16/2016   Procedure: COLONOSCOPY WITH PROPOFOL;  Surgeon: Kiran Anna, MD;  Location: [facility] ENDOSCOPY;  Service: Endoscopy;  Laterality: N/A;  . ESOPHAGOGASTRODUODENOSCOPY (EGD) WITH PROPOFOL N/A 09/16/2016   Procedure: ESOPHAGOGASTRODUODENOSCOPY (EGD) WITH PROPOFOL;  Surgeon: Kiran Anna, MD;  Location: [facility] ENDOSCOPY;  Service: Endoscopy;  Laterality: N/A;  . INTRAOPERATIVE ARTERIOGRAM     for vascular disease  . TONSILLECTOMY    .   TOTAL HIP ARTHROPLASTY Right 05/01/2015   Procedure: TOTAL HIP ARTHROPLASTY ANTERIOR APPROACH;  Surgeon: Garrin Menz, MD;  Location: [facility] ORS;  Service: Orthopedics;  Laterality: Right;    FAMILY HISTORY :   Family History  Problem Relation Age of Onset  . Adopted: Yes    SOCIAL HISTORY:   Social History  Substance Use Topics  . Smoking status: Current Every Day Smoker    Packs/day: 0.50    Types: Cigarettes  . Smokeless tobacco: Never Used  . Alcohol use 0.0 oz/week      Comment: 28 12 ounce beers per week    ALLERGIES:  is allergic to codeine sulfate and prednisone.  MEDICATIONS:  Current Outpatient Prescriptions  Medication Sig Dispense Refill  . ALPRAZolam (XANAX) 0.5 MG tablet Take 0.5 mg by mouth 2 (two) times daily as needed for sleep.     . atenolol (TENORMIN) 100 MG tablet Take 0.5 tablets (50 mg total) by mouth daily. 15 tablet 3  . diphenoxylate-atropine (LOMOTIL) 2.5-0.025 MG tablet Take 1 tablet by mouth 4 (four) times daily as needed for diarrhea or loose stools. Take it along with immodium 60 tablet 0  . feeding supplement, ENSURE ENLIVE, (ENSURE ENLIVE) LIQD Take 237 mLs by mouth 2 (two) times daily between meals. 237 mL 12  . fentaNYL (DURAGESIC - DOSED MCG/HR) 25 MCG/HR patch Place 1 patch (25 mcg total) onto the skin every 3 (three) days. 10 patch 0  . fentaNYL (DURAGESIC - DOSED MCG/HR) 75 MCG/HR Place 1 patch (75 mcg total) onto the skin every 3 (three) days. 5 patch 0  . isosorbide mononitrate (IMDUR) 30 MG 24 hr tablet TAKE ONE TABLET BY MOUTH EVERY DAY 30 tablet 2  . levETIRAcetam (KEPPRA) 500 MG tablet TAKE ONE TABLET BY MOUTH TWICE DAILY 60 tablet 2  . metFORMIN (GLUCOPHAGE) 1000 MG tablet Take 1,000 mg by mouth daily with breakfast.     . mirtazapine (REMERON) 15 MG tablet Take 1 tablet (15 mg total) by mouth at bedtime. 30 tablet 3  . morphine (MSIR) 15 MG tablet Take 1 tablet (15 mg total) by mouth every 6 (six) hours as needed for severe pain. 90 tablet 0  . pantoprazole (PROTONIX) 40 MG tablet Take 1 tablet (40 mg total) by mouth daily. 30 tablet 3  . simvastatin (ZOCOR) 40 MG tablet Take 40 mg by mouth every evening.    . tamsulosin (FLOMAX) 0.4 MG CAPS capsule Take 1 capsule (0.4 mg total) by mouth daily after supper. 30 capsule 6  . hydrocortisone (ANUSOL-HC) 25 MG suppository Place 1 suppository (25 mg total) rectally 2 (two) times daily. (Patient not taking: Reported on 10/26/2016) 60 suppository 3   No current  facility-administered medications for this visit.     PHYSICAL EXAMINATION: ECOG PERFORMANCE STATUS: 2 - Symptomatic, <50% confined to bed  BP 116/71 (BP Location: Left Arm, Patient Position: Sitting)   Pulse 66   Temp 97.8 F (36.6 C) (Tympanic)   Resp 18   Wt 169 lb 8 oz (76.9 kg)   BMI 25.77 kg/m   Filed Weights   10/28/16 1111  Weight: 169 lb 8 oz (76.9 kg)    GENERAL:  Elderly gentleman sitting comfortably in the exam room in no acute distress.  He requires assist onto the exam table. MENTAL STATUS:  Alert and oriented to person, place and time. HEAD:  Gray hair.  Scruffy beard.  Normocephalic, atraumatic, face symmetric, no Cushingoid features. EYES:  Hazel eyes.  Pupils equal round   and reactive to light and accomodation.  No conjunctivitis or scleral icterus. ENT:  Oropharynx clear without lesion.  Tongue normal. Mucous membranes moist.  RESPIRATORY:  Clear to auscultation without rales, wheezes or rhonchi. CARDIOVASCULAR:  Regular rate and rhythm without murmur, rub or gallop. ABDOMEN:  Soft, non-tender, with active bowel sounds, and no hepatosplenomegaly.  No masses. SKIN:  No rashes, ulcers or lesions. EXTREMITIES: No edema, no skin discoloration or tenderness.  No palpable cords. LYMPH NODES: No palpable cervical, supraclavicular, axillary or inguinal adenopathy  NEUROLOGICAL: Unremarkable. PSYCH:  Appropriate.   LABORATORY DATA:  I have reviewed the data as listed    Component Value Date/Time   NA 133 (L) 10/28/2016 1020   K 4.5 10/28/2016 1020   CL 104 10/28/2016 1020   CO2 18 (L) 10/28/2016 1020   GLUCOSE 140 (H) 10/28/2016 1020   BUN 19 10/28/2016 1020   CREATININE 1.34 (H) 10/28/2016 1020   CALCIUM 7.5 (L) 10/28/2016 1020   PROT 6.0 (L) 10/28/2016 1020   ALBUMIN 2.6 (L) 10/28/2016 1020   AST 25 10/28/2016 1020   ALT 15 (L) 10/28/2016 1020   ALKPHOS 166 (H) 10/28/2016 1020   BILITOT 0.5 10/28/2016 1020   GFRNONAA 56 (L) 10/28/2016 1020   GFRAA >60  10/28/2016 1020    No results found for: SPEP, UPEP  Lab Results  Component Value Date   WBC 7.2 10/28/2016   NEUTROABS 5.4 10/28/2016   HGB 8.4 (L) 10/28/2016   HCT 24.6 (L) 10/28/2016   MCV 84.5 10/28/2016   PLT 177 10/28/2016      Chemistry      Component Value Date/Time   NA 133 (L) 10/28/2016 1020   K 4.5 10/28/2016 1020   CL 104 10/28/2016 1020   CO2 18 (L) 10/28/2016 1020   BUN 19 10/28/2016 1020   CREATININE 1.34 (H) 10/28/2016 1020      Component Value Date/Time   CALCIUM 7.5 (L) 10/28/2016 1020   ALKPHOS 166 (H) 10/28/2016 1020   AST 25 10/28/2016 1020   ALT 15 (L) 10/28/2016 1020   BILITOT 0.5 10/28/2016 1020     Results for Heitz, Hager E (MRN [id number]) as of 06/25/2016 10:29  Ref. Range 10/24/2015 13:00 12/02/2015 14:40 03/04/2016 09:00 05/28/2016 11:20 06/18/2016 10:28  PSA Latest Ref Range: 0.00 - 4.00 ng/mL 24.20 (H) 43.69 (H) 68.03 (H) 262.00 (H) 355.00 (H)   IMPRESSION: 1. Multiple areas of relative increased attenuation identified in the right lobe of the liver on the previous study are not evident today. These were felt to be likely related to benign etiology previously and slight difference in bolus timing could account for lack of visualization today. 2. Stable appearance of the index lymph nodes identified previously in the abdomen and pelvis. No new or progressive lymphadenopathy on today's study. 3. Fairly widespread sclerotic bony metastases with apparent slight progression in the bony pelvis with relative stability of lesions identified in the thoracolumbar spine. 4. Interval stability of multiple lesions identified in the inferior kidneys bilaterally. Some of these have attenuation higher than would be expected for a simple cyst and while likely cysts complicated by proteinaceous debris or hemorrhage, attention on follow-up recommended. 5.  Abdominal Aortic Atherosclerois (ICD10-170.0)   Electronically Signed   By: Eric  Mansell M.D.    On: 06/21/2016 13:59 ------------------------------------------------------------------------------ IMPRESSION: Progression of diffuse osseous metastatic disease as detailed above.   Electronically Signed   By: Steven  Olson M.D.   On: 06/21/2016 15:33 RADIOGRAPHIC STUDIES:     ASSESSMENT & PLAN:   Malignant neoplasm of prostate ([place]) # METASTATIC CASTRATE RESISTANT PROSTATE CANCER- progression on Taxotere. FEB 2018- Unfortunately imaging/bone scan shows progressive disease/rapidly rising PSA ~ 1000. CT scan shows no obvious visceral  metastases apart from stable lymphadenopathy.  # s/p palliative chemo with cabazitaxel  [41m/m2]  Cycle #1 on 09/24/2016.  Patient tolerated chemotherapy well except for diarrhea/seizure [C discussion below]  #  Proceed with cycle #2 itoday.  PSA has improved from 1,008 to 894 after 1 cycle.  He feels comfortable proceeding.  # Seizure/ associated with loss of consciousness. Recommend neurology evaluation. Start patient on Keppra 500 mg twice a day. Head MRI rescheduled. Patient not a candidate for X-tandi given seizures.   # diarrhea-grade 1- continue Lomotil/Imodium.  # Anemia hemoglobin 8.4 status post 2 units of PRBCs.  Anemia work-up reveals no evidence of iron, B12, or folate deficiency.  # Lupron q 443mlast on March 16th 2018;  X-geva [4/27].   # chronic pain- sec to cancer- continue Fentanyl 100 mcg [refilled]; with morphine prn.   # RTC weekly x 2 for labs (CBC with diff)  # RTC in 3 weeks for MD assessment, labs (CBC with diff, CMP, Mg, PSA), and cycle #3 cabazitaxel.    MeLequita AsalMD 10/28/2016 11:43 AM

## 2016-11-01 ENCOUNTER — Ambulatory Visit
Admission: RE | Admit: 2016-11-01 | Discharge: 2016-11-01 | Disposition: A | Discharge status: Home / Self Care | Payer: BLUE CROSS/BLUE SHIELD | Source: Ambulatory Visit | Attending: Internal Medicine | Admitting: Internal Medicine

## 2016-11-01 DIAGNOSIS — I6782 Cerebral ischemia: Secondary | ICD-10-CM | POA: Diagnosis not present

## 2016-11-01 DIAGNOSIS — C61 Malignant neoplasm of prostate: Secondary | ICD-10-CM | POA: Insufficient documentation

## 2016-11-01 DIAGNOSIS — R569 Unspecified convulsions: Secondary | ICD-10-CM | POA: Insufficient documentation

## 2016-11-01 MED ORDER — GADOBENATE DIMEGLUMINE 529 MG/ML IV SOLN
15.0000 mL | Freq: Once | INTRAVENOUS | Status: AC | PRN
  Administered 2016-11-01: 15 mL via INTRAVENOUS

## 2016-11-04 ENCOUNTER — Other Ambulatory Visit: Payer: Self-pay | Admitting: *Deleted

## 2016-11-04 ENCOUNTER — Inpatient Hospital Stay: Payer: BLUE CROSS/BLUE SHIELD

## 2016-11-04 ENCOUNTER — Telehealth: Payer: Self-pay | Admitting: *Deleted

## 2016-11-04 DIAGNOSIS — D649 Anemia, unspecified: Secondary | ICD-10-CM

## 2016-11-04 DIAGNOSIS — Z5111 Encounter for antineoplastic chemotherapy: Secondary | ICD-10-CM

## 2016-11-04 DIAGNOSIS — C7951 Secondary malignant neoplasm of bone: Secondary | ICD-10-CM

## 2016-11-04 DIAGNOSIS — C61 Malignant neoplasm of prostate: Secondary | ICD-10-CM | POA: Diagnosis not present

## 2016-11-04 LAB — CBC WITH DIFFERENTIAL/PLATELET
Basophils Absolute: 0.2 10*3/uL — ABNORMAL HIGH (ref 0–0.1)
Basophils Relative: 1 %
Eosinophils Absolute: 0.2 10*3/uL (ref 0–0.7)
Eosinophils Relative: 1 %
HCT: 23 % — ABNORMAL LOW (ref 40.0–52.0)
Hemoglobin: 7.7 g/dL — ABNORMAL LOW (ref 13.0–18.0)
Lymphocytes Relative: 5 %
Lymphs Abs: 1.2 10*3/uL (ref 1.0–3.6)
MCH: 28.4 pg (ref 26.0–34.0)
MCHC: 33.5 g/dL (ref 32.0–36.0)
MCV: 84.9 fL (ref 80.0–100.0)
Monocytes Absolute: 1 10*3/uL (ref 0.2–1.0)
Monocytes Relative: 4 %
Neutro Abs: 21.6 10*3/uL — ABNORMAL HIGH (ref 1.4–6.5)
Neutrophils Relative %: 89 %
Platelets: 167 10*3/uL (ref 150–440)
RBC: 2.71 MIL/uL — ABNORMAL LOW (ref 4.40–5.90)
RDW: 19.6 % — ABNORMAL HIGH (ref 11.5–14.5)
Smear Review: ADEQUATE
WBC: 24.2 10*3/uL — ABNORMAL HIGH (ref 3.8–10.6)

## 2016-11-04 LAB — PREPARE RBC (CROSSMATCH)

## 2016-11-04 NOTE — Telephone Encounter (Signed)
Attempted to reach patient- no answer left vm msg - to have pt to return my phone call asap.

## 2016-11-04 NOTE — Telephone Encounter (Signed)
Contacted patient - left vm to have pt contact c ctr as soon as possible.  Pt needs 1 unit of blood. He was not type/screen today/armbanded before leaving clinic. He will need to come back to c ctr for type/screen.  Unable to reach patient. msgs left x 2  V/o orders entered for blood-not yet released.

## 2016-11-05 ENCOUNTER — Inpatient Hospital Stay: Payer: BLUE CROSS/BLUE SHIELD

## 2016-11-05 DIAGNOSIS — C61 Malignant neoplasm of prostate: Secondary | ICD-10-CM | POA: Diagnosis not present

## 2016-11-05 DIAGNOSIS — D649 Anemia, unspecified: Secondary | ICD-10-CM

## 2016-11-05 MED ORDER — DIPHENHYDRAMINE HCL 25 MG PO CAPS
25.0000 mg | ORAL_CAPSULE | Freq: Once | ORAL | Status: AC
  Administered 2016-11-05: 25 mg via ORAL
  Filled 2016-11-05: qty 1

## 2016-11-05 MED ORDER — ACETAMINOPHEN 325 MG PO TABS
650.0000 mg | ORAL_TABLET | Freq: Once | ORAL | Status: AC
  Administered 2016-11-05: 650 mg via ORAL
  Filled 2016-11-05: qty 2

## 2016-11-05 MED ORDER — SODIUM CHLORIDE 0.9 % IV SOLN
250.0000 mL | Freq: Once | INTRAVENOUS | Status: AC
  Administered 2016-11-05: 250 mL via INTRAVENOUS
  Filled 2016-11-05: qty 250

## 2016-11-06 LAB — TYPE AND SCREEN
ABO/RH(D): O POS
Antibody Screen: NEGATIVE
Unit division: 0

## 2016-11-06 LAB — BPAM RBC
Blood Product Expiration Date: 201806252359
ISSUE DATE / TIME: 201806220923
Unit Type and Rh: 9500

## 2016-11-10 ENCOUNTER — Telehealth: Payer: Self-pay | Admitting: *Deleted

## 2016-11-10 NOTE — Telephone Encounter (Signed)
They have tried for 2 months to reach patient and have not been able to reach him or wife and have left messages to return call, but they have not. They are now rejecting the referral for palliative care  being unable to reach patient

## 2016-11-10 NOTE — Telephone Encounter (Signed)
Noted  

## 2016-11-11 ENCOUNTER — Inpatient Hospital Stay: Payer: BLUE CROSS/BLUE SHIELD

## 2016-11-11 ENCOUNTER — Telehealth: Payer: Self-pay | Admitting: *Deleted

## 2016-11-11 NOTE — Telephone Encounter (Signed)
Called to report that he has been hospitalized at [facility] after having a seizure at home following a couple of days of confusion. I called and spoke with sister Juliann Pulse and she states he is intubated and that his glucose when he arrived was 30. Reportedly he aspirated and has pneumonia now. She is asking about having him transferred to [facility], I told her he needs to be stable enough to have that happen and that he is in good hands at [facility]. She is hoping that he can keep his appt next week for treatment.

## 2016-11-11 NOTE — Telephone Encounter (Signed)
md made aware.

## 2016-11-12 ENCOUNTER — Encounter: Payer: Self-pay | Admitting: *Deleted

## 2016-11-18 ENCOUNTER — Inpatient Hospital Stay: Payer: BLUE CROSS/BLUE SHIELD

## 2016-11-18 ENCOUNTER — Inpatient Hospital Stay: Payer: BLUE CROSS/BLUE SHIELD | Admitting: Internal Medicine

## 2016-11-23 ENCOUNTER — Ambulatory Visit: Payer: BLUE CROSS/BLUE SHIELD | Admitting: Gastroenterology

## 2016-12-10 ENCOUNTER — Encounter: Payer: Self-pay | Admitting: Hematology and Oncology

## 2016-12-15 DEATH — deceased

## 2016-12-29 ENCOUNTER — Other Ambulatory Visit: Payer: Self-pay | Admitting: Nurse Practitioner

## 2017-06-15 IMAGING — CR DG HIP (WITH OR WITHOUT PELVIS) 2-3V*R*
3 series · 3 of 3 positions shown · non-contrast
Comparison: CT scan from 10/11/2014.

CLINICAL DATA: Subsequent encounter for prostate cancer with bone
metastases. Right hip pain.

EXAM:
DG HIP (WITH OR WITHOUT PELVIS) 2-3V RIGHT

[hip ap]
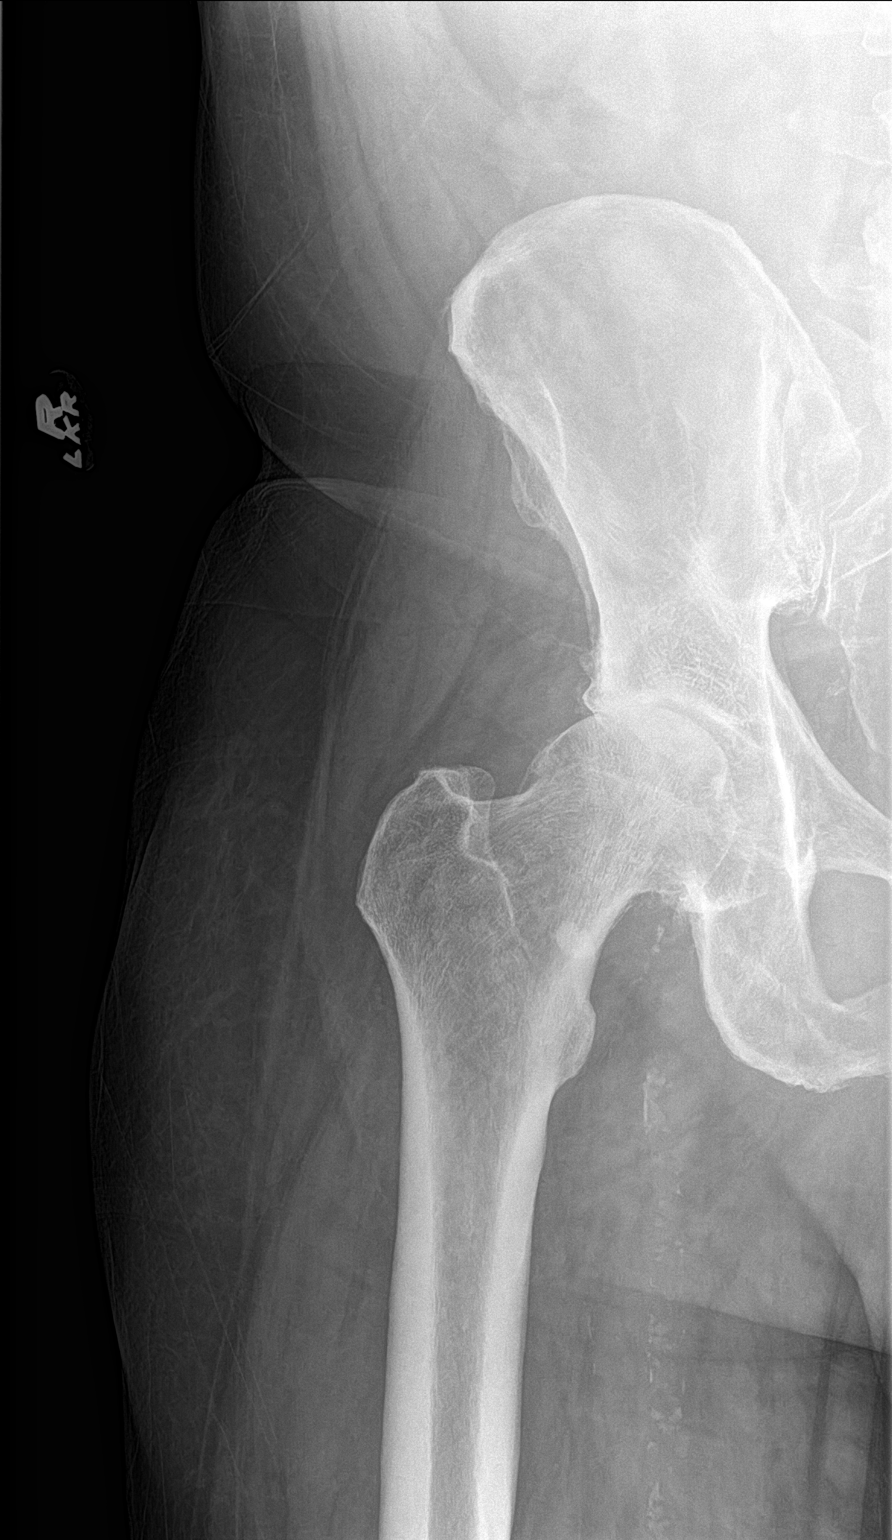

[hip lat]
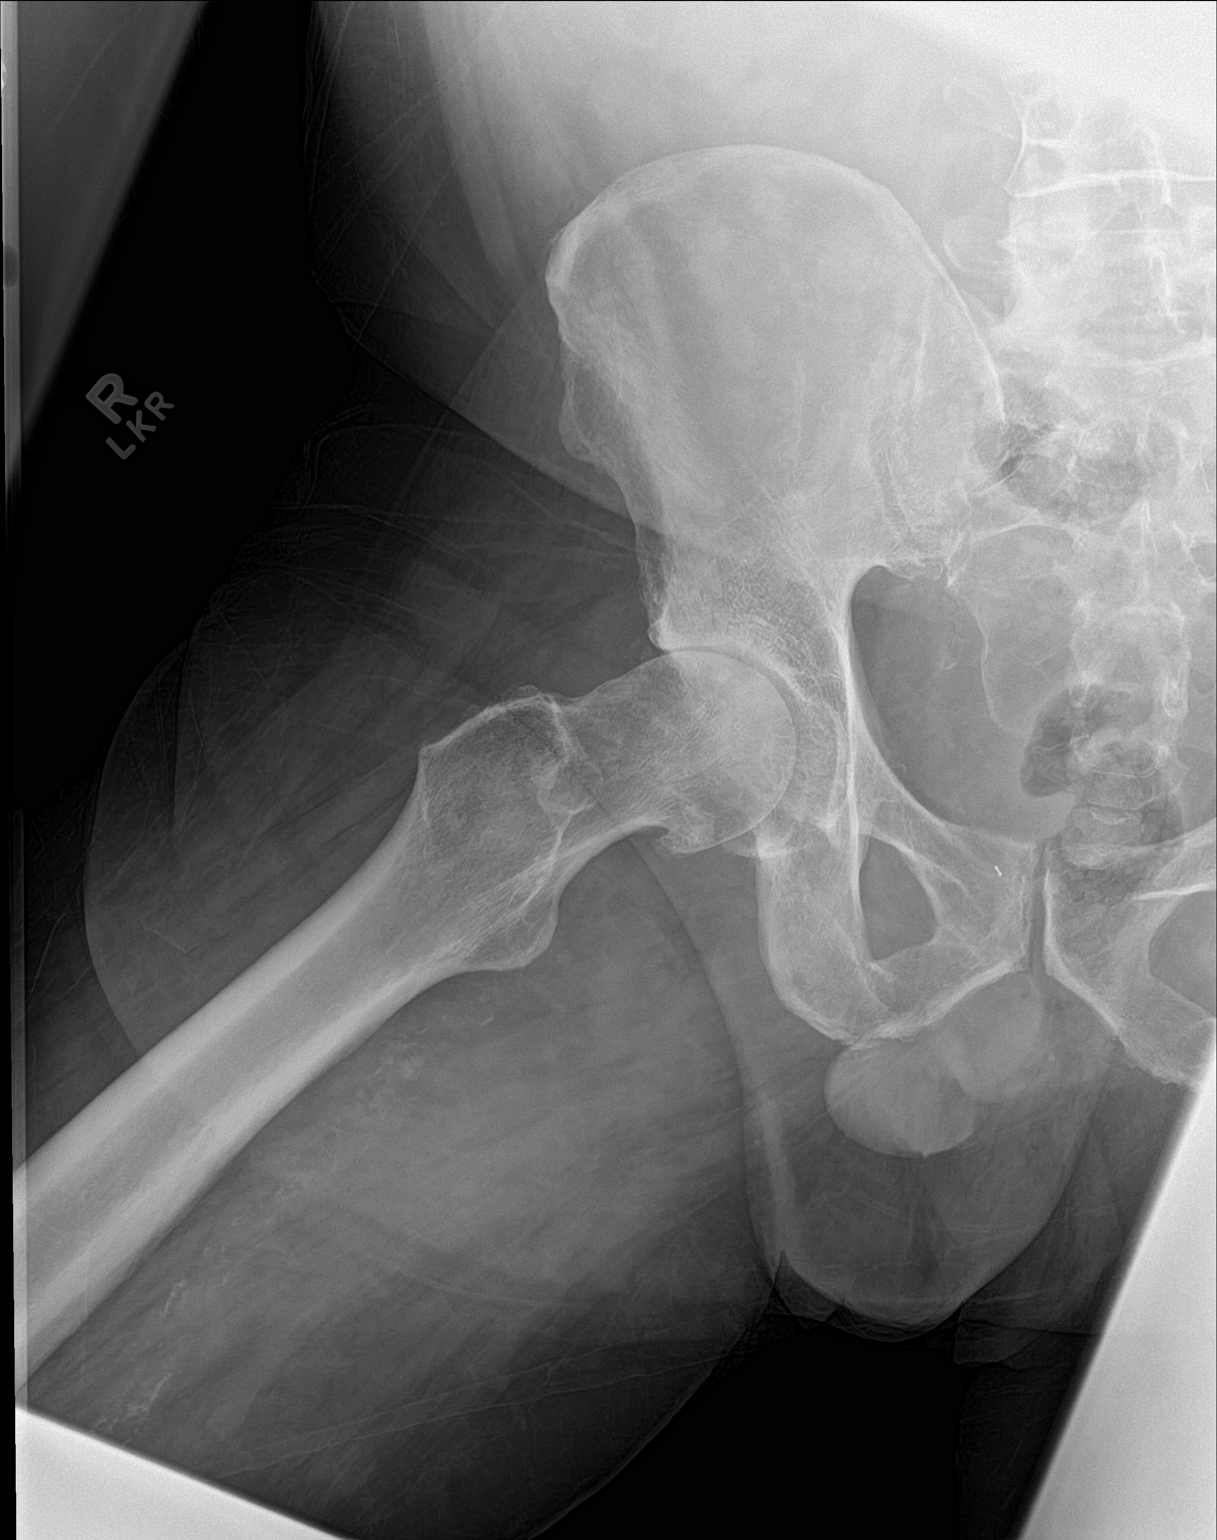

[pelvis ap]
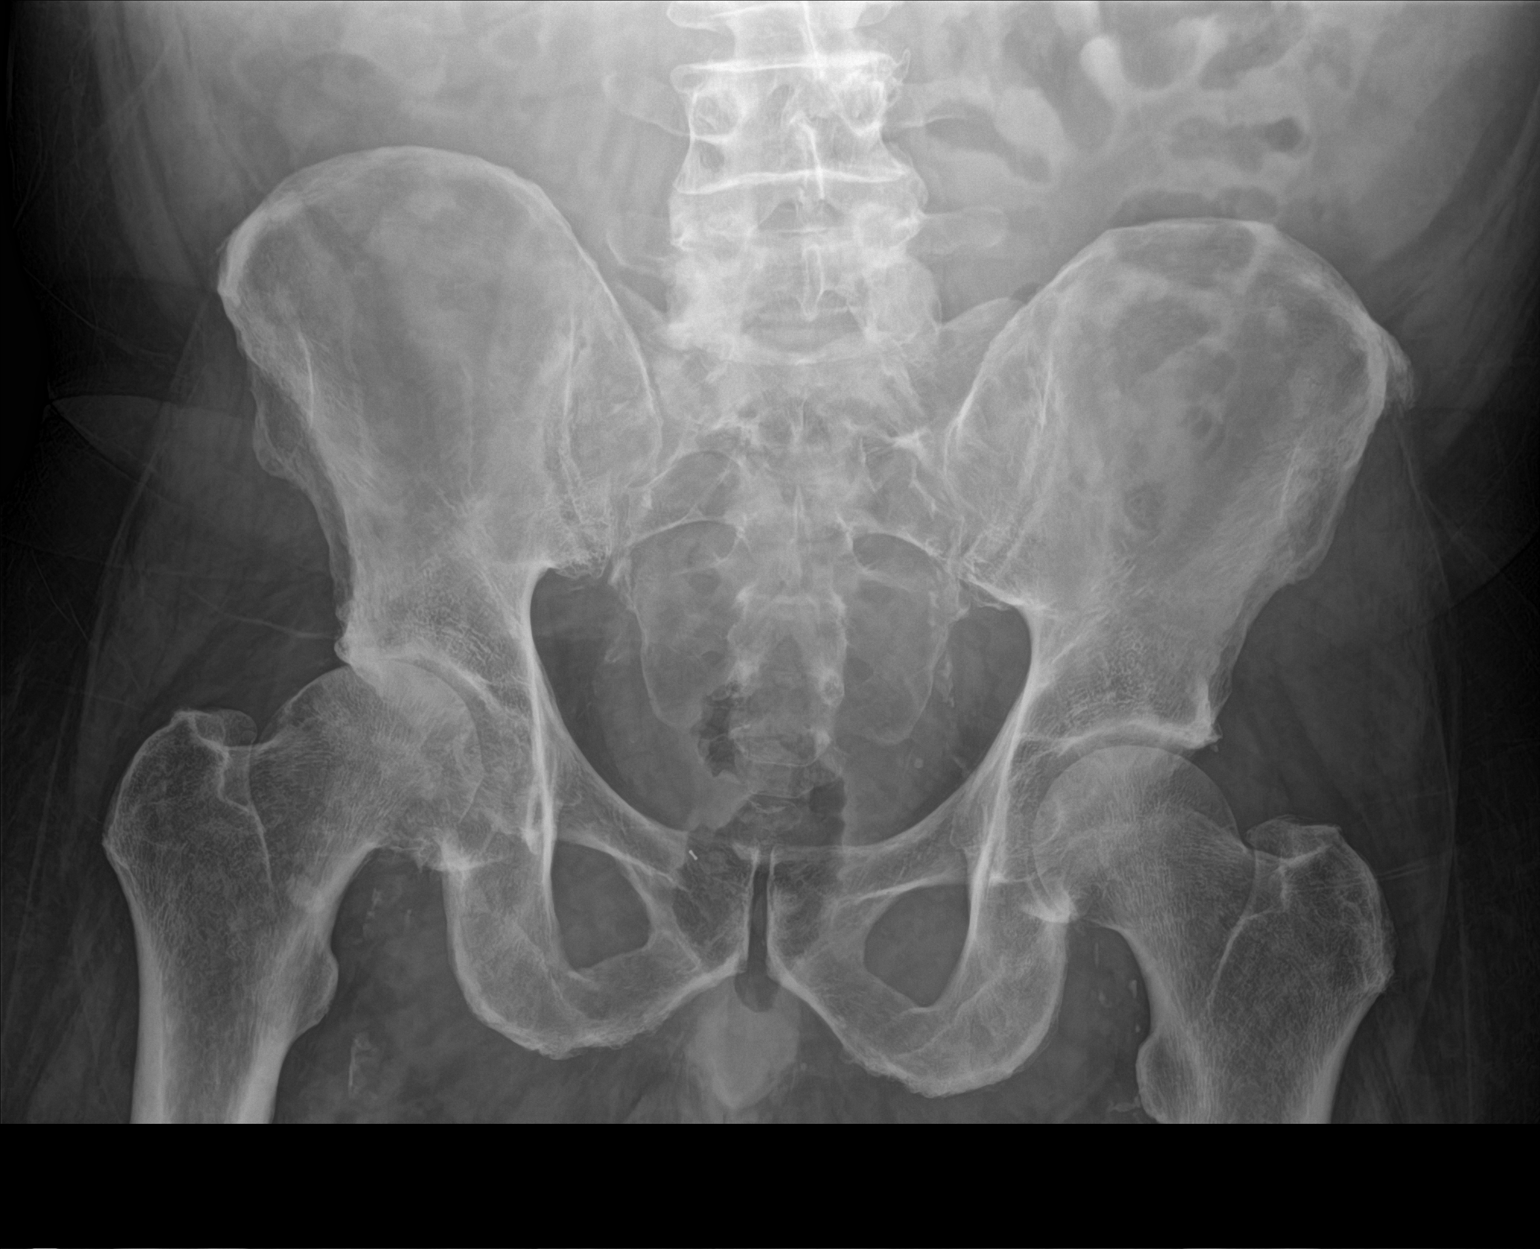

[3 of 3 positions shown; findings below may reference images not displayed]

FINDINGS: No evidence for fracture. SI joints and symphysis pubis are
unremarkable. Marked loss of joint space is seen in the right hip,
compatible osteoarthritis. Oval sclerotic density over the right
femoral neck appears to be intra-articular loose body by recent CT
scan.
IMPRESSION: Advanced degenerative changes in the right hip without acute bony
abnormality.

## 2018-05-10 IMAGING — US US EXTREM LOW VENOUS*L*
1 series · 13 of 24 positions shown · non-contrast
Comparison: None.

CLINICAL DATA: Left leg pain and swelling.



[Series 1: us extrem low venous*left* · 0.08mm/px · 13 of 41 slices shown]
[im 1/41]
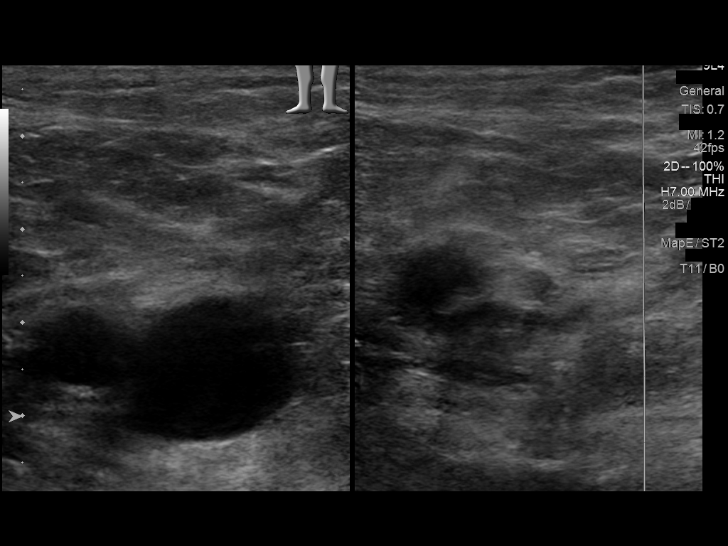
[im 4/41]
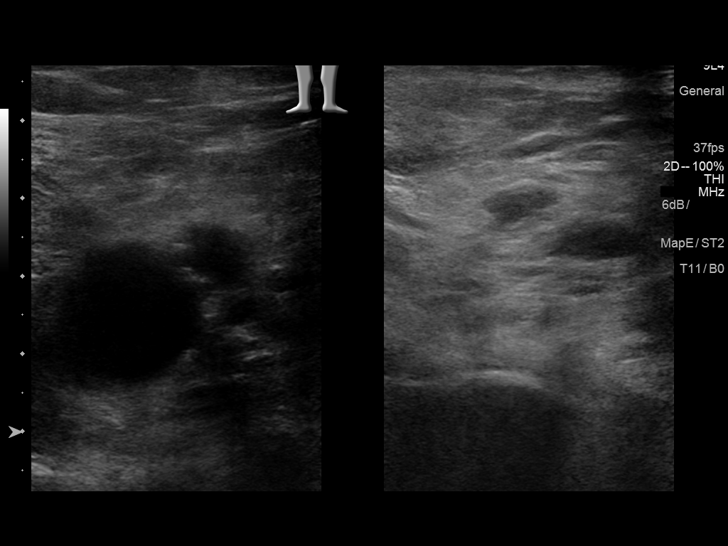
[im 7/41]
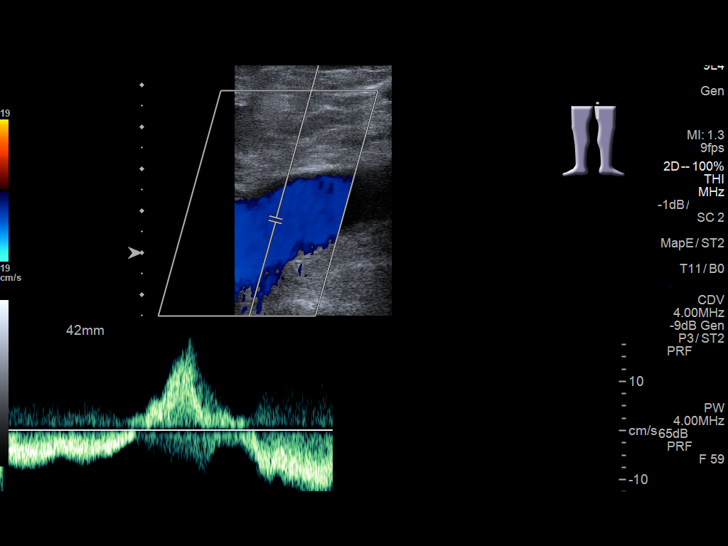
[im 11/41]
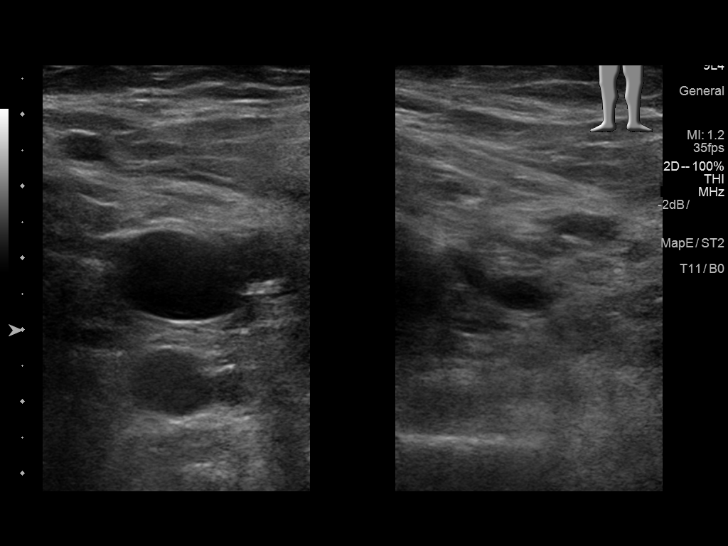
[im 14/41]
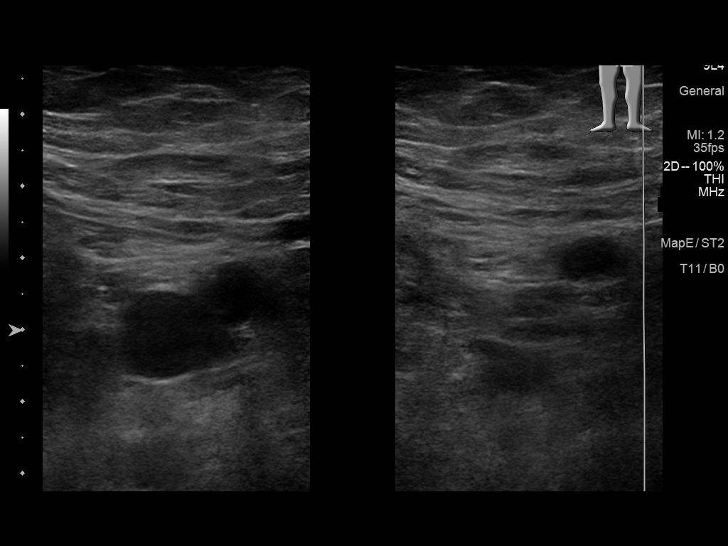
[im 18/41]
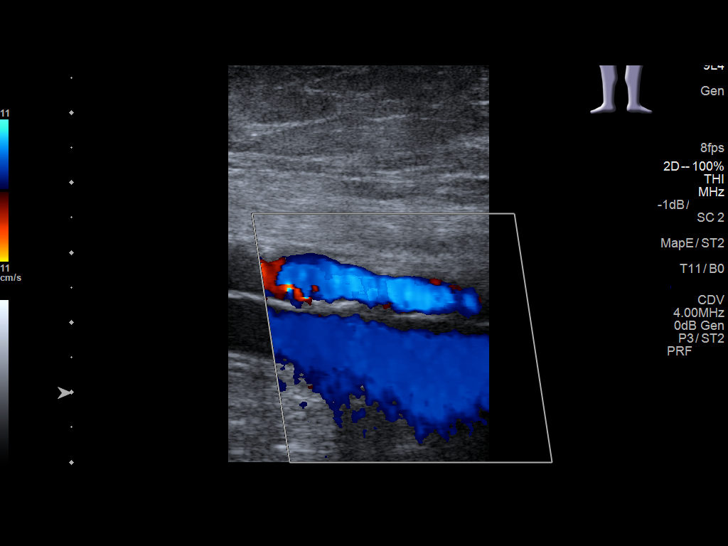
[im 21/41]
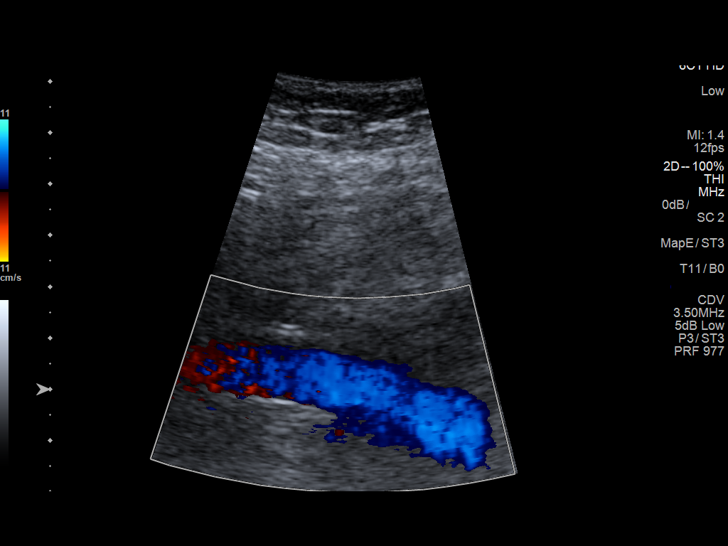
[im 23/41]
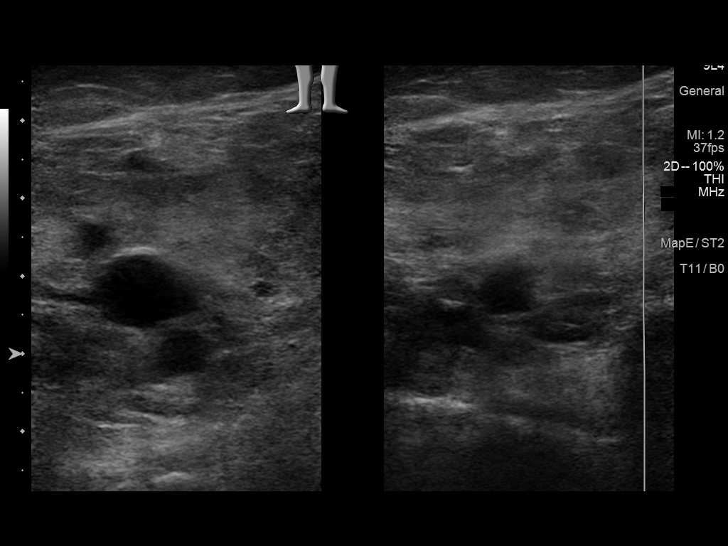
[im 27/41]
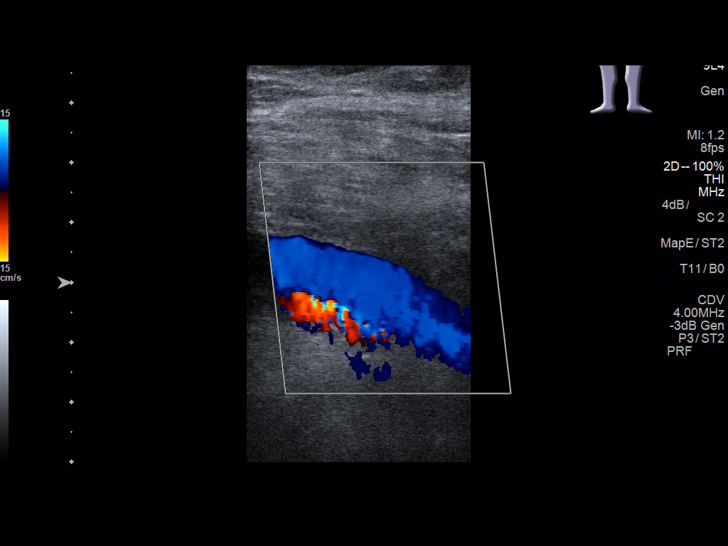
[im 30/41]
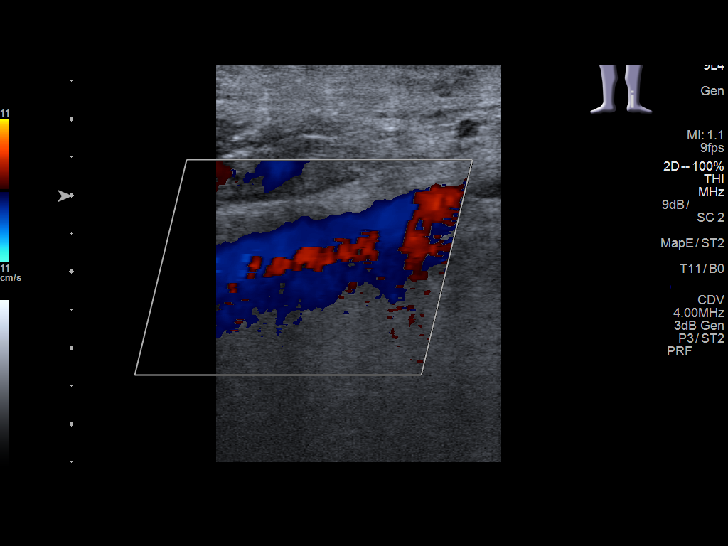
[im 34/41]
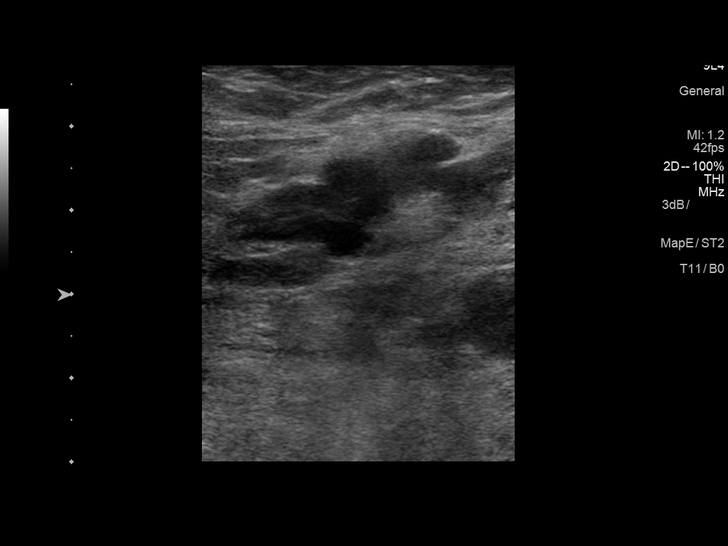
[im 37/41]
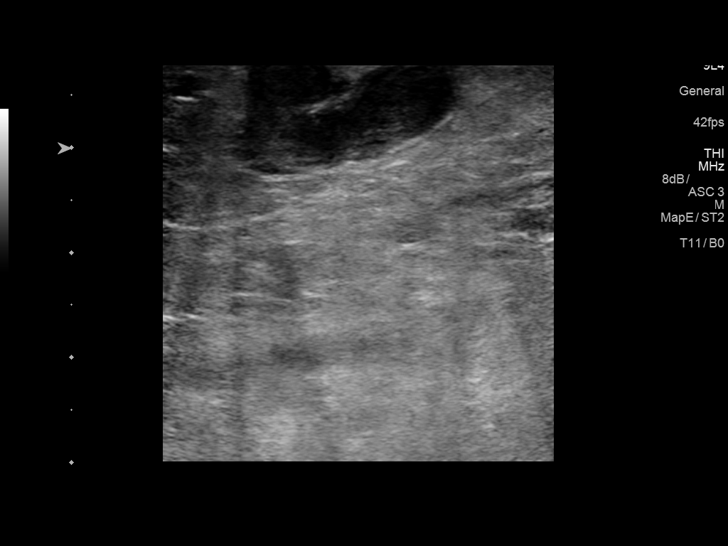
[im 41/41]
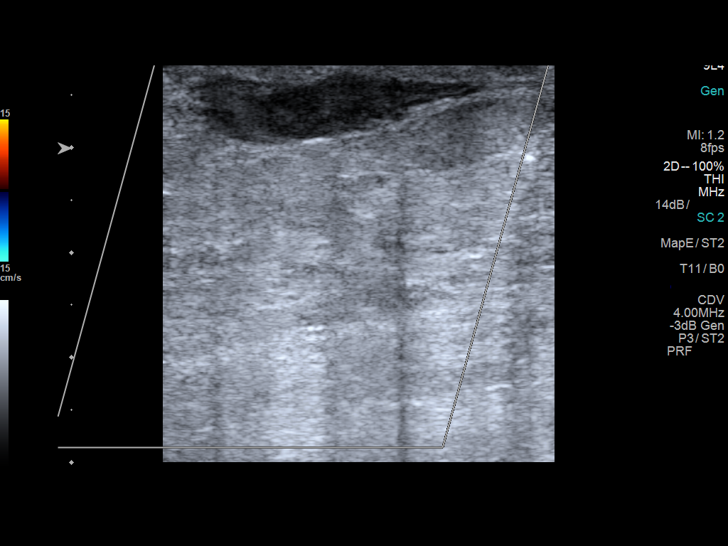

[13 of 24 positions shown; findings below may reference images not displayed]

FINDINGS: Contralateral Common Femoral Vein: Respiratory phasicity is normal
and symmetric with the symptomatic side. No evidence of thrombus.
Normal compressibility.

Common Femoral Vein: No evidence of thrombus. Normal
compressibility, respiratory phasicity and response to augmentation.

Saphenofemoral Junction: No evidence of thrombus. Normal
compressibility and flow on color Doppler imaging.

Profunda Femoral Vein: No evidence of thrombus. Normal
compressibility and flow on color Doppler imaging.

Femoral Vein: No evidence of thrombus. Normal compressibility,
respiratory phasicity and response to augmentation.

Popliteal Vein: No evidence of thrombus. Normal compressibility,
respiratory phasicity and response to augmentation.

Calf Veins: No evidence of thrombus. Normal compressibility and flow
on color Doppler imaging.

Superficial Great Saphenous Vein: Occlusive thrombus identified in
superficial varicosities at the greater saphenous vein.

Venous Reflux:  None.

Other Findings:  None.
IMPRESSION: Occlusive thrombus identified in superficial varicosities of the
greater saphenous vein involving the mid to distal thigh. No
evidence of deep venous thrombosis.

The Findings were discussed with Dr. Bogart in the emergency

## 2018-09-20 IMAGING — CR DG PELVIS 1-2V
1 series · 1 of 1 positions shown · non-contrast
Comparison: None.

CLINICAL DATA: Left thigh pain for 2 weeks without known injury.

EXAM:
PELVIS - 1-2 VIEW

[t pelvis ap]
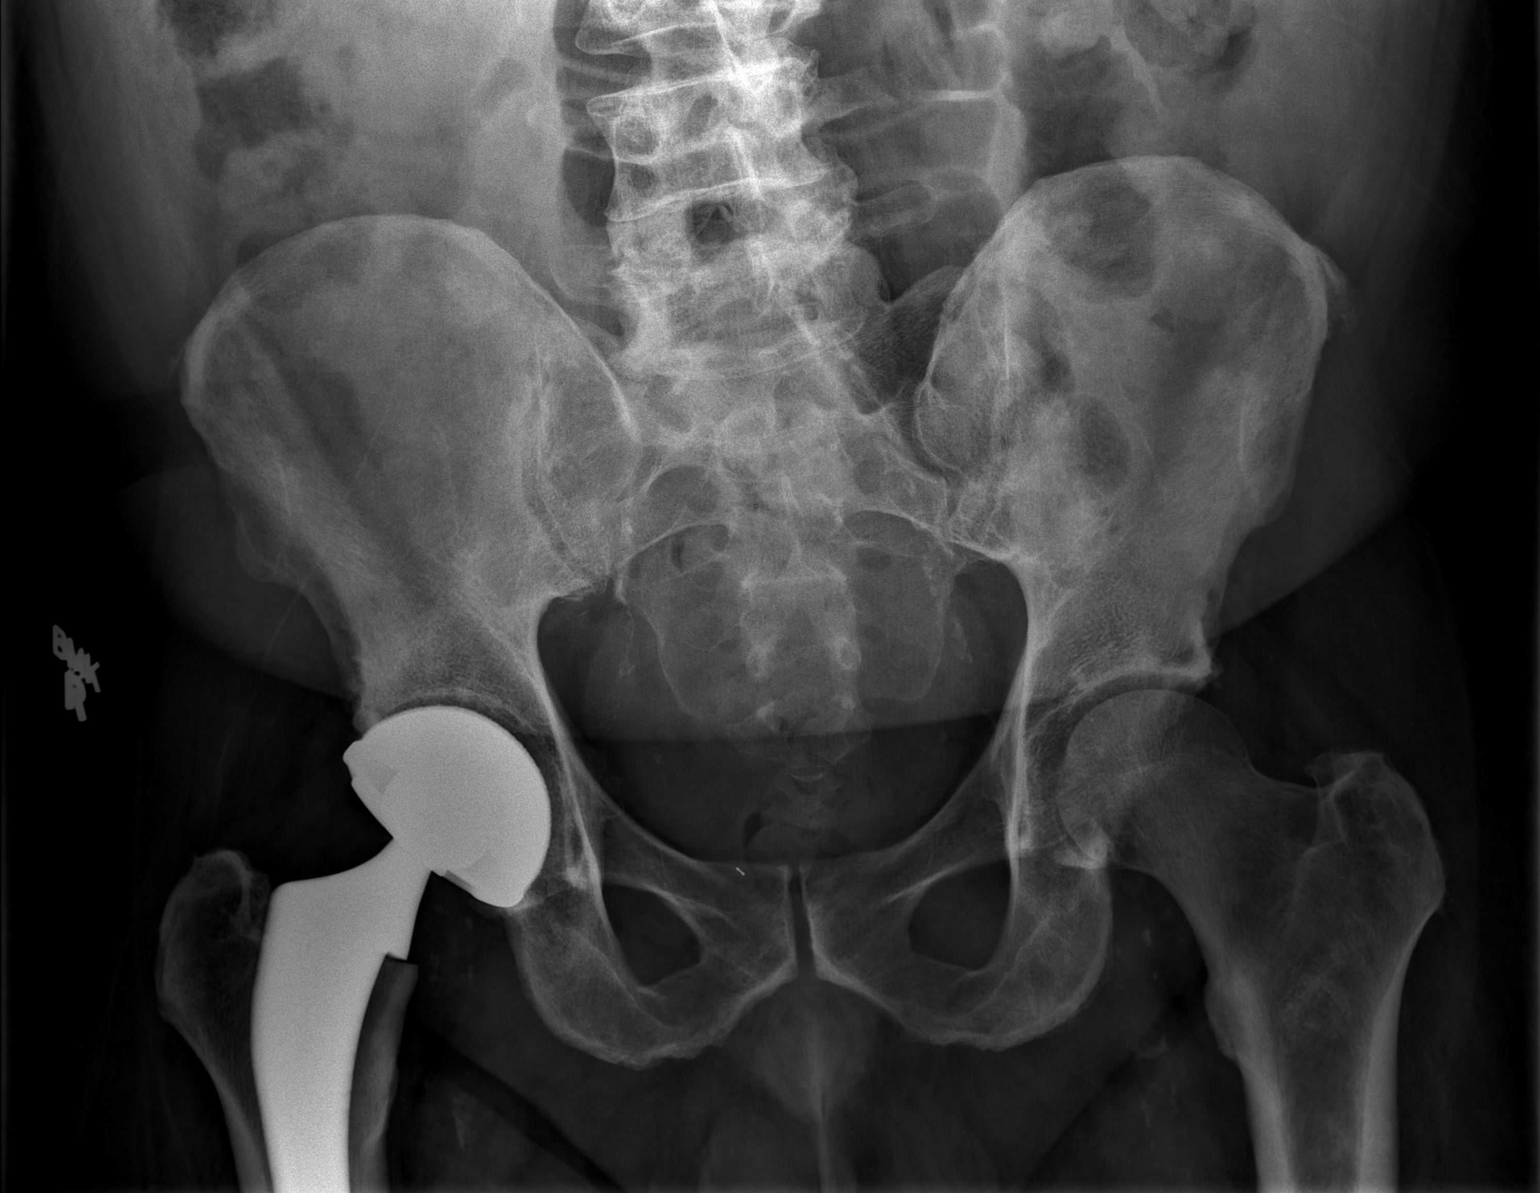

[1 of 1 positions shown; findings below may reference images not displayed]

FINDINGS: Status post right hip arthroplasty. No fracture or dislocation is
noted. Bilateral sacroiliac and left hip joints appear normal.
Ill-defined sclerotic density is noted in proximal left femur.
IMPRESSION: No fracture or dislocation is noted. Ill-defined sclerotic density
is noted in proximal left femur which may represent benign
enostosis, but neoplasm or malignancy cannot be excluded. Further
evaluation with MRI or bone scan on nonemergent basis is
recommended.
